# Patient Record
Sex: Female | Born: 1970 | Race: Black or African American | Hispanic: No | Marital: Married | Smoking: Never smoker
Health system: Southern US, Community
[De-identification: ages and names within clinical notes are randomized; demographics above are authoritative.]

## PROBLEM LIST (undated history)

## (undated) DIAGNOSIS — E669 Obesity, unspecified: Secondary | ICD-10-CM

## (undated) DIAGNOSIS — E119 Type 2 diabetes mellitus without complications: Secondary | ICD-10-CM

## (undated) DIAGNOSIS — I10 Essential (primary) hypertension: Secondary | ICD-10-CM

## (undated) HISTORY — PX: JOINT REPLACEMENT: SHX530

## (undated) HISTORY — PX: FEMUR FRACTURE SURGERY: SHX633

---

## 2002-01-08 ENCOUNTER — Emergency Department (HOSPITAL_COMMUNITY): Admission: EM | Admit: 2002-01-08 | Discharge: 2002-01-08 | Payer: Self-pay | Admitting: Emergency Medicine

## 2009-04-09 ENCOUNTER — Emergency Department (HOSPITAL_COMMUNITY): Admission: EM | Admit: 2009-04-09 | Discharge: 2009-04-09 | Payer: Self-pay | Admitting: Emergency Medicine

## 2010-12-08 ENCOUNTER — Emergency Department (HOSPITAL_COMMUNITY)
Admission: EM | Admit: 2010-12-08 | Discharge: 2010-12-08 | Disposition: A | Discharge status: Home / Self Care | Payer: No Typology Code available for payment source | Attending: Emergency Medicine | Admitting: Emergency Medicine

## 2010-12-08 DIAGNOSIS — T148XXA Other injury of unspecified body region, initial encounter: Secondary | ICD-10-CM | POA: Insufficient documentation

## 2010-12-08 DIAGNOSIS — I10 Essential (primary) hypertension: Secondary | ICD-10-CM | POA: Insufficient documentation

## 2010-12-08 DIAGNOSIS — E669 Obesity, unspecified: Secondary | ICD-10-CM | POA: Insufficient documentation

## 2010-12-08 DIAGNOSIS — IMO0001 Reserved for inherently not codable concepts without codable children: Secondary | ICD-10-CM | POA: Insufficient documentation

## 2012-03-17 ENCOUNTER — Emergency Department (HOSPITAL_COMMUNITY)
Admission: EM | Admit: 2012-03-17 | Discharge: 2012-03-17 | Disposition: A | Discharge status: Home / Self Care | Payer: Medicaid Other | Attending: Emergency Medicine | Admitting: Emergency Medicine

## 2012-03-17 DIAGNOSIS — Z888 Allergy status to other drugs, medicaments and biological substances status: Secondary | ICD-10-CM | POA: Insufficient documentation

## 2012-03-17 DIAGNOSIS — T7840XA Allergy, unspecified, initial encounter: Secondary | ICD-10-CM

## 2012-03-17 DIAGNOSIS — I10 Essential (primary) hypertension: Secondary | ICD-10-CM | POA: Insufficient documentation

## 2012-03-17 MED ORDER — DIPHENHYDRAMINE HCL 25 MG PO CAPS
25.0000 mg | ORAL_CAPSULE | Freq: Four times a day (QID) | ORAL | Status: DC | PRN
  Administered 2012-03-17: 25 mg via ORAL
  Filled 2012-03-17: qty 1

## 2012-03-17 MED ORDER — DEXAMETHASONE SODIUM PHOSPHATE 10 MG/ML IJ SOLN
10.0000 mg | Freq: Once | INTRAMUSCULAR | Status: AC
  Administered 2012-03-17: 10 mg via INTRAMUSCULAR
  Filled 2012-03-17: qty 1

## 2012-03-17 NOTE — ED Notes (Signed)
Patient stated that she used a hair product , started to itch took Claritin and started to have swelling around the eyes, eyes started to get itchy and runny. Patient also stated that she started feeling SOB. Respiration is even and unlabored. O2 saturation is 100 % in RA.

## 2012-03-17 NOTE — ED Provider Notes (Signed)
History    This chart was scribed for Nelia Shi, MD, MD by Smitty Pluck. The patient was seen in room TR09C and the patient's care was started at 6:24PM.   CSN: [id number]  Arrival date & time 03/17/12  1750   Chief Complaint  Patient presents with  . Allergic Reaction    (Consider location/radiation/quality/duration/timing/severity/associated sxs/prior treatment) Patient is a 41 y.o. female presenting with allergic reaction. The history is provided by the patient. No language interpreter was used.  Allergic Reaction The primary symptoms do not include shortness of breath, nausea or vomiting.  Significant symptoms also include rhinorrhea.   Brooke Hughes is a 41 y.o. female who presents to the Emergency Department complaining of allergic reaction onset 2 hours ago today. Pt reports having generalized itching all over her body, watery eyes and rhinorrhea. Pt reports symptoms started after her beautician applied new hair product during salon visit. She reports that she has taken Claritin at home without relief. Pt denies SOB, nausea and any other pain.   No past medical history on file.  No past surgical history on file.  No family history on file.  History  Substance Use Topics  . Smoking status: Not on file  . Smokeless tobacco: Not on file  . Alcohol Use: Not on file    OB History    No data available      Review of Systems  Constitutional: Negative for fever and chills.  HENT: Positive for rhinorrhea.   Eyes: Positive for itching.  Respiratory: Negative for shortness of breath.   Gastrointestinal: Negative for nausea and vomiting.  Neurological: Negative for weakness.    Allergies  Penicillins  Home Medications   Current Outpatient Rx  Name Route Sig Dispense Refill  . ADULT MULTIVITAMIN W/MINERALS CH Oral Take 1 tablet by mouth daily.      BP 160/118  Pulse 84  Temp 98.4 F (36.9 C) (Oral)  Resp 18  SpO2 98%  Physical Exam  Nursing note  and vitals reviewed. Constitutional: She is oriented to person, place, and time. She appears well-developed. No distress.  HENT:  Head: Normocephalic and atraumatic.  Eyes: Pupils are equal, round, and reactive to light.  Neck: Normal range of motion.  Cardiovascular: Normal rate and intact distal pulses.   Pulmonary/Chest: No stridor. No respiratory distress.  Abdominal: Normal appearance. She exhibits no distension. There is no tenderness. There is no rebound.  Musculoskeletal: Normal range of motion.  Neurological: She is alert and oriented to person, place, and time. No cranial nerve deficit.  Skin: Skin is warm and dry. Rash noted.  Psychiatric: She has a normal mood and affect. Her behavior is normal.    ED Course  Procedures (including critical care time)    Medications  Multiple Vitamin (MULTIVITAMIN WITH MINERALS) TABS (not administered)  diphenhydrAMINE (BENADRYL) capsule 25 mg (25 mg Oral Given 03/17/12 1855)  dexamethasone (DECADRON) injection 10 mg (10 mg Intramuscular Given 03/17/12 1855)    DIAGNOSTIC STUDIES: Oxygen Saturation is 97% on room air, normal by my interpretation.    COORDINATION OF CARE: 6:27 PM Discussed ED treatment with pt  6:56 PM Ordered:   Medications  Multiple Vitamin (MULTIVITAMIN WITH MINERALS) TABS (not administered)  diphenhydrAMINE (BENADRYL) capsule 25 mg (25 mg Oral Given 03/17/12 1855)  dexamethasone (DECADRON) injection 10 mg (10 mg Intramuscular Given 03/17/12 1855)       Labs Reviewed - No data to display No results found.   1. Allergic reaction  MDM  I personally performed the services described in this documentation, which was scribed in my presence. The recorded information has been reviewed and considered.    Nelia Shi, MD 03/17/12 2223

## 2012-03-17 NOTE — ED Notes (Addendum)
Reports allergic reaction that began after getting hair done this AM. C/o itchiness all over and stuffy nose. Bilateral breath sounds clear, airway clear. Pt took claritin at home.  Right eye redness noted. Denies nausea and SOB at this time.

## 2013-03-18 ENCOUNTER — Emergency Department (HOSPITAL_COMMUNITY)
Admission: EM | Admit: 2013-03-18 | Discharge: 2013-03-18 | Disposition: A | Discharge status: Home / Self Care | Payer: Medicaid Other | Attending: Emergency Medicine | Admitting: Emergency Medicine

## 2013-03-18 ENCOUNTER — Encounter (HOSPITAL_COMMUNITY): Payer: Self-pay | Admitting: Emergency Medicine

## 2013-03-18 DIAGNOSIS — Z880 Allergy status to penicillin: Secondary | ICD-10-CM | POA: Insufficient documentation

## 2013-03-18 DIAGNOSIS — K0889 Other specified disorders of teeth and supporting structures: Secondary | ICD-10-CM

## 2013-03-18 DIAGNOSIS — K089 Disorder of teeth and supporting structures, unspecified: Secondary | ICD-10-CM | POA: Insufficient documentation

## 2013-03-18 MED ORDER — CEPHALEXIN 500 MG PO CAPS: 500.0000 mg | ORAL_CAPSULE | Freq: Four times a day (QID) | ORAL | Status: DC

## 2013-03-18 MED ORDER — IBUPROFEN 800 MG PO TABS: 800.0000 mg | ORAL_TABLET | Freq: Three times a day (TID) | ORAL | Status: DC

## 2013-03-18 MED ORDER — HYDROCODONE-ACETAMINOPHEN 5-325 MG PO TABS: 1.0000 | ORAL_TABLET | Freq: Four times a day (QID) | ORAL | Status: DC | PRN

## 2013-03-18 MED ORDER — OXYCODONE-ACETAMINOPHEN 5-325 MG PO TABS
1.0000 | ORAL_TABLET | Freq: Once | ORAL | Status: AC
  Administered 2013-03-18: 1 via ORAL
  Filled 2013-03-18: qty 1

## 2013-03-18 MED ORDER — OXYCODONE-ACETAMINOPHEN 5-325 MG PO TABS: 1.0000 | ORAL_TABLET | Freq: Four times a day (QID) | ORAL | Status: DC | PRN

## 2013-03-18 NOTE — ED Provider Notes (Signed)
CSN: [id number]     Arrival date & time 03/18/13  1429 History  This chart was scribed for Felicie Morn, NP working with Darlys Gales, MD by Quintella Reichert, ED Scribe. This patient was seen in room TR10C/TR10C and the patient's care was started at 3:10 PM.  Chief Complaint  Patient presents with  . Dental Pain    Patient is a 42 y.o. female presenting with tooth pain. The history is provided by the patient. No language interpreter was used.  Dental Pain Toothache location: Right lower. Severity:  Severe Onset quality:  Gradual Duration: 3-4 days. Timing:  Constant Progression:  Worsening Chronicity:  New Context: dental caries   Associated symptoms: no fever     HPI Comments: Brooke Hughes is a 42 y.o. female who presents to the Emergency Department complaining of 3-4 days of constant, gradual-onset, gradually-worsening severe right lower dental pain.  Pt denies injury but states she may have a cavity in that area.  She went to her dentist's office today but it was not open.  She is allergic to penicillin.  She denies regular medication usage.   No past medical history on file.  No past surgical history on file.  No family history on file.   History  Substance Use Topics  . Smoking status: Not on file  . Smokeless tobacco: Not on file  . Alcohol Use: Not on file   OB History   No data available       Review of Systems  Constitutional: Negative for fever.  HENT: Positive for dental problem.   All other systems reviewed and are negative.     Allergies  Penicillins  Home Medications  No current outpatient prescriptions on file.  BP 155/105  Pulse 76  Temp(Src) 98.8 F (37.1 C) (Oral)  Resp 20  SpO2 98%  Physical Exam  Nursing note and vitals reviewed. Constitutional: She is oriented to person, place, and time. She appears well-developed and well-nourished. No distress.  HENT:  Head: Normocephalic and atraumatic.  Mouth/Throat:    Eyes: EOM  are normal.  Neck: Neck supple. No tracheal deviation present.  Cardiovascular: Normal rate.   Pulmonary/Chest: Effort normal. No respiratory distress.  Musculoskeletal: Normal range of motion.  Neurological: She is alert and oriented to person, place, and time.  Skin: Skin is warm and dry.  Psychiatric: She has a normal mood and affect. Her behavior is normal.    ED Course  Procedures (including critical care time)  DIAGNOSTIC STUDIES: Oxygen Saturation is 98% on room air, normal by my interpretation.    COORDINATION OF CARE: 3:09 PM-Discussed treatment plan which includes antibiotics, pain medication and dental f/u with pt at bedside and pt agreed to plan.    Labs Review Labs Reviewed - No data to display  Imaging Review No results found. Patient has a Education officer, community, but the office was closed today.  Patient will follow-up with her dentist on Monday. MDM   Dental pain.    I personally performed the services described in this documentation, which was scribed in my presence. The recorded information has been reviewed and is accurate.    Jimmye Norman, NP 03/18/13 [id number]

## 2013-03-18 NOTE — ED Notes (Signed)
C/o right lower molar pain. Onset one week ago.

## 2013-03-19 NOTE — ED Provider Notes (Signed)
Medical screening examination/treatment/procedure(s) were performed by non-physician practitioner and as supervising physician I was immediately available for consultation/collaboration.  Darlys Gales, MD 03/19/13 [phone]

## 2013-12-18 ENCOUNTER — Emergency Department (HOSPITAL_COMMUNITY)
Admission: EM | Admit: 2013-12-18 | Discharge: 2013-12-18 | Disposition: A | Discharge status: Home / Self Care | Payer: No Typology Code available for payment source | Attending: Emergency Medicine | Admitting: Emergency Medicine

## 2013-12-18 ENCOUNTER — Encounter (HOSPITAL_COMMUNITY): Payer: Self-pay | Admitting: Emergency Medicine

## 2013-12-18 DIAGNOSIS — Z791 Long term (current) use of non-steroidal anti-inflammatories (NSAID): Secondary | ICD-10-CM | POA: Insufficient documentation

## 2013-12-18 DIAGNOSIS — Y9389 Activity, other specified: Secondary | ICD-10-CM | POA: Insufficient documentation

## 2013-12-18 DIAGNOSIS — Z792 Long term (current) use of antibiotics: Secondary | ICD-10-CM | POA: Insufficient documentation

## 2013-12-18 DIAGNOSIS — Z880 Allergy status to penicillin: Secondary | ICD-10-CM | POA: Insufficient documentation

## 2013-12-18 DIAGNOSIS — T498X1A Poisoning by other topical agents, accidental (unintentional), initial encounter: Secondary | ICD-10-CM | POA: Insufficient documentation

## 2013-12-18 DIAGNOSIS — S058X9A Other injuries of unspecified eye and orbit, initial encounter: Secondary | ICD-10-CM | POA: Insufficient documentation

## 2013-12-18 DIAGNOSIS — Y9289 Other specified places as the place of occurrence of the external cause: Secondary | ICD-10-CM | POA: Insufficient documentation

## 2013-12-18 DIAGNOSIS — T2691XA Corrosion of right eye and adnexa, part unspecified, initial encounter: Secondary | ICD-10-CM

## 2013-12-18 DIAGNOSIS — S0502XA Injury of conjunctiva and corneal abrasion without foreign body, left eye, initial encounter: Secondary | ICD-10-CM

## 2013-12-18 DIAGNOSIS — T2692XA Corrosion of left eye and adnexa, part unspecified, initial encounter: Secondary | ICD-10-CM

## 2013-12-18 MED ORDER — FLUORESCEIN SODIUM 1 MG OP STRP
1.0000 | ORAL_STRIP | Freq: Once | OPHTHALMIC | Status: DC
  Filled 2013-12-18: qty 1

## 2013-12-18 MED ORDER — OXYCODONE-ACETAMINOPHEN 5-325 MG PO TABS: 1.0000 | ORAL_TABLET | ORAL | Status: DC | PRN

## 2013-12-18 MED ORDER — OXYCODONE-ACETAMINOPHEN 5-325 MG PO TABS
1.0000 | ORAL_TABLET | Freq: Once | ORAL | Status: AC
  Administered 2013-12-18: 1 via ORAL
  Filled 2013-12-18: qty 1

## 2013-12-18 MED ORDER — IBUPROFEN 800 MG PO TABS: 800.0000 mg | ORAL_TABLET | Freq: Three times a day (TID) | ORAL | Status: DC

## 2013-12-18 MED ORDER — ERYTHROMYCIN 5 MG/GM OP OINT
TOPICAL_OINTMENT | Freq: Once | OPHTHALMIC | Status: AC
  Administered 2013-12-18: 22:00:00 via OPHTHALMIC
  Filled 2013-12-18: qty 3.5

## 2013-12-18 MED ORDER — DIAZEPAM 5 MG PO TABS
5.0000 mg | ORAL_TABLET | Freq: Once | ORAL | Status: AC
  Administered 2013-12-18: 5 mg via ORAL
  Filled 2013-12-18: qty 1

## 2013-12-18 MED ORDER — TETRACAINE HCL 0.5 % OP SOLN
1.0000 [drp] | Freq: Once | OPHTHALMIC | Status: AC
  Administered 2013-12-18: 1 [drp] via OPHTHALMIC
  Filled 2013-12-18: qty 2

## 2013-12-18 MED ORDER — ONDANSETRON 8 MG PO TBDP
8.0000 mg | ORAL_TABLET | Freq: Once | ORAL | Status: AC
  Administered 2013-12-18: 8 mg via ORAL
  Filled 2013-12-18: qty 1

## 2013-12-18 NOTE — ED Notes (Signed)
Patient unable to keep eyes open to complete visual acuity.

## 2013-12-18 NOTE — ED Provider Notes (Signed)
Patient signed out to me at shift change by PA Piepenbrink. Patient came to emergency department complaining of burning to bilateral eyes after using a hair gel and getting in and her eyes. Patient states she did rub her eyes after her pretty vigorously. Patient with severe pain, difficulty opening her eyes, patient is receiving Morgan lens washout with normal saline.  9:29 PM Patient finished 1 L Morgan lens pulses in both eyes, right eye feeling better, left eye continues to have severe pain. Upon evaluation and noticed possible cornea defect, I repeated fluorescein stain which showed very large left eye corneal abrasion that is over entire pupil and iris. Will consult ophthalmology. Pain medication ordered, Percocet and Zofran.  9:38 PM Spoke with Dr. Nehemiah Massed, advised erythromycin ointment topically, patching the eye, follow up with him in the office.   Filed Vitals:   12/18/13 1832 12/18/13 1934 12/18/13 2137  BP: 140/101 142/89 152/79  Pulse: 72  75  Temp: 97.8 F (36.6 C)    TempSrc: Oral    Resp: 20  16  SpO2: 99%  100%     Florene Route Mirielle Byrum, PA-C 12/19/13 0036

## 2013-12-18 NOTE — ED Notes (Signed)
Patient states her eye feels the same as when she came in today.

## 2013-12-18 NOTE — ED Provider Notes (Signed)
CSN: [id number]     Arrival date & time 12/18/13  2706 History  This chart was scribed for non-physician practitioner, Baron Sane, PA-C working with Blanchard Kelch, MD by Frederich Balding, ED scribe. This patient was seen in room WTR7/WTR7 and the patient's care was started at 7:04 PM.   Chief Complaint  Patient presents with  . Burning Eyes   The history is provided by the patient. No language interpreter was used.   HPI Comments: Brooke Hughes is a 43 y.o. female who presents to the Emergency Department complaining of bilateral burning eye pain, left worse than right, redness and itching that started last night. Reports watery and yellow drainage from her eyes. Pt thinks she might have gotten hair gel in her eyes. States she has had styes in her eyes in the past but does not think this is the same. Pt does not wear contacts or glasses.    History reviewed. No pertinent past medical history. Past Surgical History  Procedure Laterality Date  . Joint replacement     No family history on file. History  Substance Use Topics  . Smoking status: Never Smoker   . Smokeless tobacco: Not on file  . Alcohol Use: No   OB History   Grav Para Term Preterm Abortions TAB SAB Ect Mult Living                 Review of Systems  Eyes: Positive for pain, discharge, redness and itching.  All other systems reviewed and are negative.  Allergies  Penicillins  Home Medications   Prior to Admission medications   Medication Sig Start Date End Date Taking? Authorizing Provider  cephALEXin (KEFLEX) 500 MG capsule Take 1 capsule (500 mg total) by mouth 4 (four) times daily. 03/18/13   Norman Herrlich, NP  ibuprofen (ADVIL,MOTRIN) 800 MG tablet Take 1 tablet (800 mg total) by mouth 3 (three) times daily. 03/18/13   Norman Herrlich, NP  oxyCODONE-acetaminophen (PERCOCET/ROXICET) 5-325 MG per tablet Take 1 tablet by mouth every 6 (six) hours as needed for pain (use for pain not controlled with  ibuprofen). 03/18/13   Norman Herrlich, NP   BP 140/101  Pulse 72  Temp(Src) 97.8 F (36.6 C) (Oral)  Resp 20  SpO2 99%  LMP 12/11/2013  Physical Exam  Nursing note and vitals reviewed. Constitutional: She is oriented to person, place, and time. She appears well-developed and well-nourished. No distress.  HENT:  Head: Normocephalic and atraumatic.  Right Ear: External ear normal.  Left Ear: External ear normal.  Nose: Nose normal.  Mouth/Throat: Oropharynx is clear and moist.  Eyes: EOM are normal. Pupils are equal, round, and reactive to light. Right eye exhibits discharge (watery). Right eye exhibits no chemosis, no exudate and no hordeolum. No foreign body present in the right eye. Left eye exhibits discharge (watery). Left eye exhibits no chemosis, no exudate and no hordeolum. No foreign body present in the left eye. Right conjunctiva is injected. Right conjunctiva has no hemorrhage. Left conjunctiva is injected. Left conjunctiva has no hemorrhage. No scleral icterus.  Fundoscopic exam:      The right eye shows no hemorrhage and no papilledema.       The left eye shows no hemorrhage and no papilledema.  Slit lamp exam:      The right eye shows no corneal abrasion, no corneal flare, no corneal ulcer and no fluorescein uptake.       The left eye shows no  corneal abrasion, no corneal flare, no corneal ulcer and no fluorescein uptake.  pH of left eye: 7.5 pH of right eye: 8  Neck: Normal range of motion. Neck supple.  Cardiovascular: Normal rate.   Pulmonary/Chest: Effort normal.  Abdominal: Soft.  Musculoskeletal: Normal range of motion.  Neurological: She is alert and oriented to person, place, and time.  Skin: Skin is warm and dry. She is not diaphoretic.  Psychiatric: She has a normal mood and affect.    ED Course  Procedures (including critical care time) Medications  tetracaine (PONTOCAINE) 0.5 % ophthalmic solution 1 drop (not administered)  fluorescein ophthalmic  strip 1 strip (not administered)  diazepam (VALIUM) tablet 5 mg (5 mg Oral Given 12/18/13 1933)    DIAGNOSTIC STUDIES: Oxygen Saturation is 99% on RA, normal by my interpretation.    COORDINATION OF CARE: 7:08 PM-Discussed treatment plan which includes checking pH of eyes and for corneal abrasions with pt at bedside and pt agreed to plan.   7:15 PM-Will give pt valium and flush eyes out.  Labs Review Labs Reviewed - No data to display  Imaging Review No results found.   EKG Interpretation None      MDM   Final diagnoses:  None    Filed Vitals:   12/18/13 1934  BP: 142/89  Pulse:   Temp:   Resp:    Afebrile, NAD, non-toxic appearing, AAOx4.  Patient presenting to emergency department for bilateral injected, turning eye pain with watery discharge after getting hair gel in her eyes last evening. Unable to obtain visual acuity testing as patient is unable to keep her eyes open. There is no orbital or periorbital swelling, areas are nontender to palpation. There is no intraocular abnormality noted. Mildly elevated pH, this testing. No fluorescein uptake in either eye. Patient declined Valium. Pain likely secondarily to hair gel. Will place Abram lens and re-evaluate. Will sign patient out to [employer], PA-C.  I personally performed the services described in this documentation, which was scribed in my presence. The recorded information has been reviewed and is accurate.  Harlow Mares, PA-C 12/18/13 2007

## 2013-12-18 NOTE — ED Notes (Addendum)
On assessment of left eye patient cornea green from fluorescein solution. Patient states eye stills feel same as when she first came in.  Morgan lens with 2 L fluid applied at this time. Patient tolerating well.

## 2013-12-18 NOTE — Discharge Instructions (Signed)
Erythromycin ointment every 2 hrs to the left eye. Take pain medications as prescribed. Follow up with Dr. Nehemiah Massed in the office tomorrow  Corneal Abrasion The cornea is the clear covering at the front and center of the eye. When looking at the colored portion of the eye (iris), you are looking through the cornea. This very thin tissue is made up of many layers. The surface layer is a single layer of cells (corneal epithelium) and is one of the most sensitive tissues in the body. If a scratch or injury causes the corneal epithelium to come off, it is called a corneal abrasion. If the injury extends to the tissues below the epithelium, the condition is called a corneal ulcer. CAUSES   Scratches.  Trauma.  Foreign body in the eye. Some people have recurrences of abrasions in the area of the original injury even after it has healed (recurrent erosion syndrome). Recurrent erosion syndrome generally improves and goes away with time. SYMPTOMS   Eye pain.  Difficulty or inability to keep the injured eye open.  The eye becomes very sensitive to light.  Recurrent erosions tend to happen suddenly, first thing in the morning, usually after waking up and opening the eye. DIAGNOSIS  Your health care provider can diagnose a corneal abrasion during an eye exam. Dye is usually placed in the eye using a drop or a small paper strip moistened by your tears. When the eye is examined with a special light, the abrasion shows up clearly because of the dye. TREATMENT   Small abrasions may be treated with antibiotic drops or ointment alone.  A pressure patch may be put over the eye. If this is done, follow your doctor's instructions for when to remove the patch. Do not drive or use machines while the eye patch is on. Judging distances is hard to do with a patch on. If the abrasion becomes infected and spreads to the deeper tissues of the cornea, a corneal ulcer can result. This is serious because it can cause  corneal scarring. Corneal scars interfere with light passing through the cornea and cause a loss of vision in the involved eye. HOME CARE INSTRUCTIONS  Use medicine or ointment as directed. Only take over-the-counter or prescription medicines for pain, discomfort, or fever as directed by your health care provider.  Do not drive or operate machinery if your eye is patched. Your ability to judge distances is impaired.  If your health care provider has given you a follow-up appointment, it is very important to keep that appointment. Not keeping the appointment could result in a severe eye infection or permanent loss of vision. If there is any problem keeping the appointment, let your health care provider know. SEEK MEDICAL CARE IF:   You have pain, light sensitivity, and a scratchy feeling in one eye or both eyes.  Your pressure patch keeps loosening up, and you can blink your eye under the patch after treatment.  Any kind of discharge develops from the eye after treatment or if the lids stick together in the morning.  You have the same symptoms in the morning as you did with the original abrasion days, weeks, or months after the abrasion healed. MAKE SURE YOU:   Understand these instructions.  Will watch your condition.  Will get help right away if you are not doing well or get worse. Document Released: 05/30/2000 Document Revised: 06/07/2013 Document Reviewed: 02/07/2013 [facility] Patient Information 2015 [place], [place]. This information is not intended to replace advice  given to you by your health care provider. Make sure you discuss any questions you have with your health care provider.

## 2013-12-18 NOTE — ED Notes (Signed)
Pt c/o bilateral eye burning, itching, and pain. States she may have gotten hair gel in her eyes last night

## 2013-12-19 NOTE — ED Provider Notes (Signed)
Medical screening examination/treatment/procedure(s) were conducted as a shared visit with non-physician practitioner(s) and myself.  I personally evaluated the patient during the encounter.   EKG Interpretation None      I interviewed and examined the patient. Lungs are CTAB. Cardiac exam wnl. Abdomen soft. Pt appears to have large corneal abrasion to left eye. Tatyana discussed case w/ optho who will f/u the pt tomorrow.   Blanchard Kelch, MD 12/19/13 2007

## 2013-12-19 NOTE — ED Provider Notes (Signed)
Medical screening examination/treatment/procedure(s) were conducted as a shared visit with non-physician practitioner(s) and myself.  I personally evaluated the patient during the encounter.   EKG Interpretation None      See other note.   Blanchard Kelch, MD 12/19/13 2006

## 2014-04-27 ENCOUNTER — Encounter (HOSPITAL_COMMUNITY): Payer: Self-pay | Admitting: Emergency Medicine

## 2014-04-27 ENCOUNTER — Emergency Department (HOSPITAL_COMMUNITY)
Admission: EM | Admit: 2014-04-27 | Discharge: 2014-04-27 | Disposition: A | Discharge status: Home / Self Care | Payer: No Typology Code available for payment source | Attending: Emergency Medicine | Admitting: Emergency Medicine

## 2014-04-27 DIAGNOSIS — Z880 Allergy status to penicillin: Secondary | ICD-10-CM | POA: Insufficient documentation

## 2014-04-27 DIAGNOSIS — Y9289 Other specified places as the place of occurrence of the external cause: Secondary | ICD-10-CM | POA: Insufficient documentation

## 2014-04-27 DIAGNOSIS — S0501XA Injury of conjunctiva and corneal abrasion without foreign body, right eye, initial encounter: Secondary | ICD-10-CM | POA: Insufficient documentation

## 2014-04-27 DIAGNOSIS — I10 Essential (primary) hypertension: Secondary | ICD-10-CM | POA: Insufficient documentation

## 2014-04-27 DIAGNOSIS — Z792 Long term (current) use of antibiotics: Secondary | ICD-10-CM | POA: Insufficient documentation

## 2014-04-27 DIAGNOSIS — Z791 Long term (current) use of non-steroidal anti-inflammatories (NSAID): Secondary | ICD-10-CM | POA: Insufficient documentation

## 2014-04-27 DIAGNOSIS — H1031 Unspecified acute conjunctivitis, right eye: Secondary | ICD-10-CM | POA: Insufficient documentation

## 2014-04-27 DIAGNOSIS — Y998 Other external cause status: Secondary | ICD-10-CM | POA: Insufficient documentation

## 2014-04-27 DIAGNOSIS — E669 Obesity, unspecified: Secondary | ICD-10-CM | POA: Insufficient documentation

## 2014-04-27 DIAGNOSIS — Y9389 Activity, other specified: Secondary | ICD-10-CM | POA: Insufficient documentation

## 2014-04-27 DIAGNOSIS — X58XXXA Exposure to other specified factors, initial encounter: Secondary | ICD-10-CM | POA: Insufficient documentation

## 2014-04-27 HISTORY — DX: Essential (primary) hypertension: I10

## 2014-04-27 HISTORY — DX: Obesity, unspecified: E66.9

## 2014-04-27 MED ORDER — FLUORESCEIN SODIUM 1 MG OP STRP
1.0000 | ORAL_STRIP | Freq: Once | OPHTHALMIC | Status: DC
  Filled 2014-04-27: qty 1

## 2014-04-27 MED ORDER — ERYTHROMYCIN 5 MG/GM OP OINT
1.0000 "application " | TOPICAL_OINTMENT | Freq: Once | OPHTHALMIC | Status: AC
  Administered 2014-04-27: 1 via OPHTHALMIC
  Filled 2014-04-27: qty 3.5

## 2014-04-27 MED ORDER — TETRACAINE HCL 0.5 % OP SOLN
2.0000 [drp] | Freq: Once | OPHTHALMIC | Status: DC
  Filled 2014-04-27: qty 2

## 2014-04-27 MED ORDER — ATROPINE SULFATE 1 % OP SOLN
1.0000 [drp] | Freq: Four times a day (QID) | OPHTHALMIC | Status: DC
  Administered 2014-04-27: 1 [drp] via OPHTHALMIC
  Filled 2014-04-27: qty 2

## 2014-04-27 NOTE — ED Notes (Signed)
Discharge instructions and meds given.  Instructions given and patient voiced understanding.

## 2014-04-27 NOTE — ED Notes (Signed)
Attempted to do a visual acuity on pt pt states she could not see out of neither of her eyes also states that her R eye is blurry no other complaints noted at this time

## 2014-04-27 NOTE — Discharge Instructions (Signed)
Corneal Abrasion The cornea is the clear covering at the front and center of the eye. When looking at the colored portion of the eye (iris), you are looking through the cornea. This very thin tissue is made up of many layers. The surface layer is a single layer of cells (corneal epithelium) and is one of the most sensitive tissues in the body. If a scratch or injury causes the corneal epithelium to come off, it is called a corneal abrasion. If the injury extends to the tissues below the epithelium, the condition is called a corneal ulcer. CAUSES   Scratches.  Trauma.  Foreign body in the eye. Some people have recurrences of abrasions in the area of the original injury even after it has healed (recurrent erosion syndrome). Recurrent erosion syndrome generally improves and goes away with time. SYMPTOMS   Eye pain.  Difficulty or inability to keep the injured eye open.  The eye becomes very sensitive to light.  Recurrent erosions tend to happen suddenly, first thing in the morning, usually after waking up and opening the eye. DIAGNOSIS  Your health care provider can diagnose a corneal abrasion during an eye exam. Dye is usually placed in the eye using a drop or a small paper strip moistened by your tears. When the eye is examined with a special light, the abrasion shows up clearly because of the dye. TREATMENT   Small abrasions may be treated with antibiotic drops or ointment alone.  A pressure patch may be put over the eye. If this is done, follow your doctor's instructions for when to remove the patch. Do not drive or use machines while the eye patch is on. Judging distances is hard to do with a patch on. If the abrasion becomes infected and spreads to the deeper tissues of the cornea, a corneal ulcer can result. This is serious because it can cause corneal scarring. Corneal scars interfere with light passing through the cornea and cause a loss of vision in the involved eye. HOME CARE  INSTRUCTIONS  Use medicine or ointment as directed. Only take over-the-counter or prescription medicines for pain, discomfort, or fever as directed by your health care provider.  Do not drive or operate machinery if your eye is patched. Your ability to judge distances is impaired.  If your health care provider has given you a follow-up appointment, it is very important to keep that appointment. Not keeping the appointment could result in a severe eye infection or permanent loss of vision. If there is any problem keeping the appointment, let your health care provider know. SEEK MEDICAL CARE IF:   You have pain, light sensitivity, and a scratchy feeling in one eye or both eyes.  Your pressure patch keeps loosening up, and you can blink your eye under the patch after treatment.  Any kind of discharge develops from the eye after treatment or if the lids stick together in the morning.  You have the same symptoms in the morning as you did with the original abrasion days, weeks, or months after the abrasion healed. MAKE SURE YOU:   Understand these instructions.  Will watch your condition.  Will get help right away if you are not doing well or get worse. Document Released: 05/30/2000 Document Revised: 06/07/2013 Document Reviewed: 02/07/2013 [facility] Patient Information 2015 [place], [place]. This information is not intended to replace advice given to you by your health care provider. Make sure you discuss any questions you have with your health care provider. Please use the  supplied eye drops for the next 2-3 days 1 drop to your R eye 4 times daily Use the supplied ointment 1/4 inch strip inside the lower lid 4 times daily for the next 5 days   IT IS IMPORTANT THAT YOU USE THE EYE DROP FIRST wait 1-2 minutes than use the ointment

## 2014-04-27 NOTE — ED Provider Notes (Signed)
CSN: [id number]     Arrival date & time 04/27/14  1925 History   First MD Initiated Contact with Patient 04/27/14 2214     Chief Complaint  Patient presents with  . Conjunctivitis     (Consider location/radiation/quality/duration/timing/severity/associated sxs/prior Treatment) HPI Comments: Patient states she slept with Hair gel that was supposed to be washed out prior to bed that noted in the morning she had irritation to R eye that has progressively gotten worse over the day  Now with blurry vision, photophobia, and tearing   Patient is a 43 y.o. female presenting with conjunctivitis. The history is provided by the patient.  Conjunctivitis This is a new problem. The current episode started yesterday. The problem occurs constantly. The problem has been gradually worsening. Pertinent negatives include no chest pain, congestion, fever, rash or weakness. Exacerbated by: blinking. She has tried nothing for the symptoms. The treatment provided no relief.    Past Medical History  Diagnosis Date  . Hypertension   . Obesity    Past Surgical History  Procedure Laterality Date  . Joint replacement     No family history on file. History  Substance Use Topics  . Smoking status: Never Smoker   . Smokeless tobacco: Not on file  . Alcohol Use: No   OB History    No data available     Review of Systems  Constitutional: Negative for fever.  HENT: Negative for congestion and facial swelling.   Eyes: Positive for photophobia, pain, discharge, redness and visual disturbance.  Respiratory: Negative for shortness of breath.   Cardiovascular: Negative for chest pain.  Musculoskeletal: Negative for back pain.  Skin: Negative for rash and wound.  Neurological: Negative for dizziness and weakness.  All other systems reviewed and are negative.     Allergies  Penicillins  Home Medications   Prior to Admission medications   Medication Sig Start Date End Date Taking? Authorizing Provider   cephALEXin (KEFLEX) 500 MG capsule Take 1 capsule (500 mg total) by mouth 4 (four) times daily. 03/18/13   Norman Herrlich, NP  ibuprofen (ADVIL,MOTRIN) 800 MG tablet Take 1 tablet (800 mg total) by mouth 3 (three) times daily. 03/18/13   Norman Herrlich, NP  ibuprofen (ADVIL,MOTRIN) 800 MG tablet Take 1 tablet (800 mg total) by mouth 3 (three) times daily. 12/18/13   Tatyana A Kirichenko, PA-C  oxyCODONE-acetaminophen (PERCOCET) 5-325 MG per tablet Take 1 tablet by mouth every 4 (four) hours as needed for severe pain. 12/18/13   Tatyana A Kirichenko, PA-C  oxyCODONE-acetaminophen (PERCOCET/ROXICET) 5-325 MG per tablet Take 1 tablet by mouth every 6 (six) hours as needed for pain (use for pain not controlled with ibuprofen). 03/18/13   Norman Herrlich, NP   BP 156/101 mmHg  Pulse 67  Temp(Src) 98.7 F (37.1 C)  Resp 16  SpO2 100%  LMP 04/19/2014 Physical Exam  Constitutional: She appears well-developed and well-nourished.  HENT:  Head: Normocephalic.  Right Ear: External ear normal.  Left Ear: External ear normal.  Eyes: EOM are normal. Right eye exhibits discharge. Right conjunctiva is injected.  Slit lamp exam:      The right eye shows corneal abrasion.    Neck: Normal range of motion.  Cardiovascular: Normal rate.   Pulmonary/Chest: Effort normal.  Musculoskeletal: Normal range of motion.  Neurological: She is alert.  Skin: Skin is warm. No rash noted. No pallor.  Nursing note and vitals reviewed.   ED Course  Procedures (including critical care time)  Labs Review Labs Reviewed - No data to display  Imaging Review No results found.   EKG Interpretation None     patient with corneal abrasion over pupil with treat with Atropine opthalmic drop and erythromycin ointment and opth FU  MDM   Final diagnoses:  Corneal abrasion, right, initial encounter         Garald Balding, NP 04/27/14 [place], MD 05/04/14 1013

## 2014-04-27 NOTE — ED Notes (Signed)
Pt. Reports right eye redness , itching / " burning " , with drainage onset last night , denies injury , no vision loss or blurred vision . Denies fever or chills.

## 2015-01-09 ENCOUNTER — Emergency Department (HOSPITAL_COMMUNITY)
Admission: EM | Admit: 2015-01-09 | Discharge: 2015-01-10 | Disposition: A | Discharge status: Home / Self Care | Payer: Self-pay | Attending: Emergency Medicine | Admitting: Emergency Medicine

## 2015-01-09 ENCOUNTER — Encounter (HOSPITAL_COMMUNITY): Payer: Self-pay

## 2015-01-09 DIAGNOSIS — I10 Essential (primary) hypertension: Secondary | ICD-10-CM | POA: Insufficient documentation

## 2015-01-09 DIAGNOSIS — E669 Obesity, unspecified: Secondary | ICD-10-CM | POA: Insufficient documentation

## 2015-01-09 DIAGNOSIS — Z880 Allergy status to penicillin: Secondary | ICD-10-CM | POA: Insufficient documentation

## 2015-01-09 DIAGNOSIS — R1031 Right lower quadrant pain: Secondary | ICD-10-CM | POA: Insufficient documentation

## 2015-01-09 DIAGNOSIS — Z3202 Encounter for pregnancy test, result negative: Secondary | ICD-10-CM | POA: Insufficient documentation

## 2015-01-09 DIAGNOSIS — R1011 Right upper quadrant pain: Secondary | ICD-10-CM

## 2015-01-09 DIAGNOSIS — Z79899 Other long term (current) drug therapy: Secondary | ICD-10-CM | POA: Insufficient documentation

## 2015-01-09 DIAGNOSIS — R109 Unspecified abdominal pain: Secondary | ICD-10-CM

## 2015-01-09 LAB — COMPREHENSIVE METABOLIC PANEL
ALT: 12 U/L — ABNORMAL LOW (ref 14–54)
AST: 19 U/L (ref 15–41)
Albumin: 3.6 g/dL (ref 3.5–5.0)
Alkaline Phosphatase: 89 U/L (ref 38–126)
Anion gap: 6 (ref 5–15)
BUN: 11 mg/dL (ref 6–20)
CO2: 27 mmol/L (ref 22–32)
Calcium: 9.2 mg/dL (ref 8.9–10.3)
Chloride: 105 mmol/L (ref 101–111)
Creatinine, Ser: 0.87 mg/dL (ref 0.44–1.00)
GFR calc Af Amer: >60 mL/min (ref >60)
GFR calc non Af Amer: >60 mL/min (ref >60)
Glucose, Bld: 152 mg/dL — ABNORMAL HIGH (ref 65–99)
Potassium: 3.6 mmol/L (ref 3.5–5.1)
Sodium: 138 mmol/L (ref 135–145)
Total Bilirubin: 0.4 mg/dL (ref 0.3–1.2)
Total Protein: 7.9 g/dL (ref 6.5–8.1)

## 2015-01-09 LAB — LIPASE, BLOOD: Lipase: 14 U/L — ABNORMAL LOW (ref 22–51)

## 2015-01-09 LAB — CBC
HCT: 32 % — ABNORMAL LOW (ref 36.0–46.0)
Hemoglobin: 9.8 g/dL — ABNORMAL LOW (ref 12.0–15.0)
MCH: 22.4 pg — ABNORMAL LOW (ref 26.0–34.0)
MCHC: 30.6 g/dL (ref 30.0–36.0)
MCV: 73.1 fL — ABNORMAL LOW (ref 78.0–100.0)
Platelets: 383 10*3/uL (ref 150–400)
RBC: 4.38 MIL/uL (ref 3.87–5.11)
RDW: 17.7 % — ABNORMAL HIGH (ref 11.5–15.5)
WBC: 6.2 10*3/uL (ref 4.0–10.5)

## 2015-01-09 LAB — I-STAT BETA HCG BLOOD, ED (MC, WL, AP ONLY): I-stat hCG, quantitative: <5 m[IU]/mL (ref <5)

## 2015-01-09 NOTE — ED Notes (Signed)
Pt presents with c/o abdominal pain on the lower right side for approx 2 days. Pt denies any N/V/D.

## 2015-01-10 ENCOUNTER — Emergency Department (HOSPITAL_COMMUNITY): Payer: Self-pay

## 2015-01-10 ENCOUNTER — Encounter (HOSPITAL_COMMUNITY): Payer: Self-pay

## 2015-01-10 ENCOUNTER — Emergency Department (HOSPITAL_COMMUNITY): Payer: Medicaid Other

## 2015-01-10 LAB — URINALYSIS, ROUTINE W REFLEX MICROSCOPIC
Bilirubin Urine: NEGATIVE
Glucose, UA: NEGATIVE mg/dL
Hgb urine dipstick: NEGATIVE
Ketones, ur: NEGATIVE mg/dL
Leukocytes, UA: NEGATIVE
Nitrite: NEGATIVE
Protein, ur: NEGATIVE mg/dL
Specific Gravity, Urine: 1.031 — ABNORMAL HIGH (ref 1.005–1.030)
Urobilinogen, UA: 0.2 mg/dL (ref 0.0–1.0)
pH: 5.5 (ref 5.0–8.0)

## 2015-01-10 MED ORDER — IOHEXOL 300 MG/ML  SOLN
100.0000 mL | Freq: Once | INTRAMUSCULAR | Status: AC | PRN
  Administered 2015-01-10: 100 mL via INTRAVENOUS

## 2015-01-10 MED ORDER — HYDROCODONE-ACETAMINOPHEN 5-325 MG PO TABS: 1.0000 | ORAL_TABLET | Freq: Four times a day (QID) | ORAL | Status: DC | PRN

## 2015-01-10 MED ORDER — ONDANSETRON 4 MG PO TBDP: 4.0000 mg | ORAL_TABLET | Freq: Three times a day (TID) | ORAL | Status: DC | PRN

## 2015-01-10 MED ORDER — SODIUM CHLORIDE 0.9 % IV BOLUS (SEPSIS)
1000.0000 mL | Freq: Once | INTRAVENOUS | Status: AC
Start: 2015-01-10 — End: 2015-01-10
  Administered 2015-01-10: 1000 mL via INTRAVENOUS

## 2015-01-10 MED ORDER — ONDANSETRON HCL 4 MG/2ML IJ SOLN
4.0000 mg | Freq: Once | INTRAMUSCULAR | Status: DC
  Filled 2015-01-10: qty 2

## 2015-01-10 MED ORDER — IOHEXOL 300 MG/ML  SOLN
25.0000 mL | Freq: Once | INTRAMUSCULAR | Status: AC | PRN
  Administered 2015-01-10: 25 mL via ORAL

## 2015-01-10 MED ORDER — MORPHINE SULFATE 4 MG/ML IJ SOLN
4.0000 mg | Freq: Once | INTRAMUSCULAR | Status: DC
  Filled 2015-01-10: qty 1

## 2015-01-10 NOTE — Discharge Instructions (Signed)
Please follow up with your primary care physician in 1-2 days. If you do not have one please call the [facility] number listed above. Please take pain medication and/or muscle relaxants as prescribed and as needed for pain. Please do not drive on narcotic pain medication or on muscle relaxants. Please read all discharge instructions and return precautions.    Abdominal Pain, Women Abdominal (stomach, pelvic, or belly) pain can be caused by many things. It is important to tell your doctor:  The location of the pain.  Does it come and go or is it present all the time?  Are there things that start the pain (eating certain foods, exercise)?  Are there other symptoms associated with the pain (fever, nausea, vomiting, diarrhea)? All of this is helpful to know when trying to find the cause of the pain. CAUSES   Stomach: virus or bacteria infection, or ulcer.  Intestine: appendicitis (inflamed appendix), regional ileitis (Crohn's disease), ulcerative colitis (inflamed colon), irritable bowel syndrome, diverticulitis (inflamed diverticulum of the colon), or cancer of the stomach or intestine.  Gallbladder disease or stones in the gallbladder.  Kidney disease, kidney stones, or infection.  Pancreas infection or cancer.  Fibromyalgia (pain disorder).  Diseases of the female organs:  Uterus: fibroid (non-cancerous) tumors or infection.  Fallopian tubes: infection or tubal pregnancy.  Ovary: cysts or tumors.  Pelvic adhesions (scar tissue).  Endometriosis (uterus lining tissue growing in the pelvis and on the pelvic organs).  Pelvic congestion syndrome (female organs filling up with blood just before the menstrual period).  Pain with the menstrual period.  Pain with ovulation (producing an egg).  Pain with an IUD (intrauterine device, birth control) in the uterus.  Cancer of the female organs.  Functional pain (pain not caused by a disease, may improve  without treatment).  Psychological pain.  Depression. DIAGNOSIS  Your doctor will decide the seriousness of your pain by doing an examination.  Blood tests.  X-rays.  Ultrasound.  CT scan (computed tomography, special type of X-ray).  MRI (magnetic resonance imaging).  Cultures, for infection.  Barium enema (dye inserted in the large intestine, to better view it with X-rays).  Colonoscopy (looking in intestine with a lighted tube).  Laparoscopy (minor surgery, looking in abdomen with a lighted tube).  Major abdominal exploratory surgery (looking in abdomen with a large incision). TREATMENT  The treatment will depend on the cause of the pain.   Many cases can be observed and treated at home.  Over-the-counter medicines recommended by your caregiver.  Prescription medicine.  Antibiotics, for infection.  Birth control pills, for painful periods or for ovulation pain.  Hormone treatment, for endometriosis.  Nerve blocking injections.  Physical therapy.  Antidepressants.  Counseling with a psychologist or psychiatrist.  Minor or major surgery. HOME CARE INSTRUCTIONS   Do not take laxatives, unless directed by your caregiver.  Take over-the-counter pain medicine only if ordered by your caregiver. Do not take aspirin because it can cause an upset stomach or bleeding.  Try a clear liquid diet (broth or water) as ordered by your caregiver. Slowly move to a bland diet, as tolerated, if the pain is related to the stomach or intestine.  Have a thermometer and take your temperature several times a day, and record it.  Bed rest and sleep, if it helps the pain.  Avoid sexual intercourse, if it causes pain.  Avoid stressful situations.  Keep your follow-up appointments and tests, as your caregiver orders.  If  the pain does not go away with medicine or surgery, you may try:  Acupuncture.  Relaxation exercises (yoga, meditation).  Group  therapy.  Counseling. SEEK MEDICAL CARE IF:   You notice certain foods cause stomach pain.  Your home care treatment is not helping your pain.  You need stronger pain medicine.  You want your IUD removed.  You feel faint or lightheaded.  You develop nausea and vomiting.  You develop a rash.  You are having side effects or an allergy to your medicine. SEEK IMMEDIATE MEDICAL CARE IF:   Your pain does not go away or gets worse.  You have a fever.  Your pain is felt only in portions of the abdomen. The right side could possibly be appendicitis. The left lower portion of the abdomen could be colitis or diverticulitis.  You are passing blood in your stools (bright red or black tarry stools, with or without vomiting).  You have blood in your urine.  You develop chills, with or without a fever.  You pass out. MAKE SURE YOU:   Understand these instructions.  Will watch your condition.  Will get help right away if you are not doing well or get worse. Document Released: 03/30/2007 Document Revised: 10/17/2013 Document Reviewed: 04/19/2009 [facility] Patient Information 2015 [place], [place]. This information is not intended to replace advice given to you by your health care provider. Make sure you discuss any questions you have with your health care provider.

## 2015-01-10 NOTE — ED Notes (Signed)
Patient request to wait for medication. Patient agrees to NS Bolus Fluid.

## 2015-01-10 NOTE — ED Provider Notes (Signed)
CSN: [id number]     Arrival date & time 01/09/15  2129 History   First MD Initiated Contact with Patient 01/10/15 0035     Chief Complaint  Patient presents with  . Abdominal Pain     (Consider location/radiation/quality/duration/timing/severity/associated sxs/prior Treatment) HPI Comments: Patient is a 44 yo M PMHx significant for HTN presenting to the ED for evaluation of acute onset RLQ abdominal pain that began two days ago that has been gradually worsening in severity. Patient states she has felt fatigued, but denies any nausea, vomiting, diarrhea, or fevers. She states she finished her menstrual cycle last week, no other GU complaints. No urinary symptoms. No medications tried PTA. Abdominal surgical history includes three c-sections.    Past Medical History  Diagnosis Date  . Hypertension   . Obesity    Past Surgical History  Procedure Laterality Date  . Joint replacement     No family history on file. History  Substance Use Topics  . Smoking status: Never Smoker   . Smokeless tobacco: Not on file  . Alcohol Use: No   OB History    No data available     Review of Systems  Gastrointestinal: Positive for abdominal pain.  All other systems reviewed and are negative.     Allergies  Penicillins  Home Medications   Prior to Admission medications   Medication Sig Start Date End Date Taking? Authorizing Provider  Multiple Vitamin (MULTIVITAMIN WITH MINERALS) TABS tablet Take 1 tablet by mouth daily.   Yes Historical Provider, MD  cephALEXin (KEFLEX) 500 MG capsule Take 1 capsule (500 mg total) by mouth 4 (four) times daily. Patient not taking: Reported on 01/10/2015 03/18/13   Etta Quill, NP  HYDROcodone-acetaminophen (NORCO/VICODIN) 5-325 MG per tablet Take 1-2 tablets by mouth every 6 (six) hours as needed for severe pain. 01/10/15   Pearline Yerby, PA-C  ibuprofen (ADVIL,MOTRIN) 800 MG tablet Take 1 tablet (800 mg total) by mouth 3 (three) times  daily. Patient not taking: Reported on 01/10/2015 03/18/13   Etta Quill, NP  ibuprofen (ADVIL,MOTRIN) 800 MG tablet Take 1 tablet (800 mg total) by mouth 3 (three) times daily. Patient not taking: Reported on 01/10/2015 12/18/13   Tatyana Kirichenko, PA-C  ondansetron (ZOFRAN ODT) 4 MG disintegrating tablet Take 1 tablet (4 mg total) by mouth every 8 (eight) hours as needed for nausea. 01/10/15   Riggin Cuttino, PA-C  oxyCODONE-acetaminophen (PERCOCET) 5-325 MG per tablet Take 1 tablet by mouth every 4 (four) hours as needed for severe pain. Patient not taking: Reported on 01/10/2015 12/18/13   Jeannett Senior, PA-C  oxyCODONE-acetaminophen (PERCOCET/ROXICET) 5-325 MG per tablet Take 1 tablet by mouth every 6 (six) hours as needed for pain (use for pain not controlled with ibuprofen). Patient not taking: Reported on 01/10/2015 03/18/13   Etta Quill, NP   BP 145/74 mmHg  Pulse 63  Temp(Src) 98.5 F (36.9 C) (Oral)  Resp 18  SpO2 100%  LMP 01/02/2015 (Approximate) Physical Exam  Constitutional: She is oriented to person, place, and time. She appears well-developed and well-nourished. No distress.  HENT:  Head: Normocephalic and atraumatic.  Right Ear: External ear normal.  Left Ear: External ear normal.  Nose: Nose normal.  Eyes: Conjunctivae are normal.  Neck: Neck supple.  Cardiovascular: Normal rate, regular rhythm and normal heart sounds.   Pulmonary/Chest: Effort normal and breath sounds normal.  Abdominal: Soft. Bowel sounds are normal. There is tenderness in the right lower quadrant. There is tenderness at McBurney's point. There  is no rigidity, no rebound and no guarding.  Neurological: She is alert and oriented to person, place, and time.  Skin: Skin is warm and dry. She is not diaphoretic.  Nursing note and vitals reviewed.   ED Course  Procedures (including critical care time) Medications  morphine 4 MG/ML injection 4 mg (4 mg Intravenous Not Given 01/10/15 0415)   ondansetron (ZOFRAN) injection 4 mg (4 mg Intravenous Not Given 01/10/15 0415)  sodium chloride 0.9 % bolus 1,000 mL (0 mLs Intravenous Stopped 01/10/15 0415)  iohexol (OMNIPAQUE) 300 MG/ML solution 25 mL (25 mLs Oral Contrast Given 01/10/15 0244)  iohexol (OMNIPAQUE) 300 MG/ML solution 100 mL (100 mLs Intravenous Contrast Given 01/10/15 0329)    Labs Review Labs Reviewed  LIPASE, BLOOD - Abnormal; Notable for the following:    Lipase 14 (*)    All other components within normal limits  COMPREHENSIVE METABOLIC PANEL - Abnormal; Notable for the following:    Glucose, Bld 152 (*)    ALT 12 (*)    All other components within normal limits  CBC - Abnormal; Notable for the following:    Hemoglobin 9.8 (*)    HCT 32.0 (*)    MCV 73.1 (*)    MCH 22.4 (*)    RDW 17.7 (*)    All other components within normal limits  URINALYSIS, ROUTINE W REFLEX MICROSCOPIC (NOT AT [facility]) - Abnormal; Notable for the following:    APPearance CLOUDY (*)    Specific Gravity, Urine 1.031 (*)    All other components within normal limits  I-STAT BETA HCG BLOOD, ED ([facility], [facility], [facility] ONLY)    Imaging Review Ct Abdomen Pelvis W Contrast  01/10/2015   CLINICAL DATA:  Right lower quadrant pain for 2 days.  EXAM: CT ABDOMEN AND PELVIS WITH CONTRAST  TECHNIQUE: Multidetector CT imaging of the abdomen and pelvis was performed using the standard protocol following bolus administration of intravenous contrast.  CONTRAST:  168mL OMNIPAQUE IOHEXOL 300 MG/ML  SOLN  COMPARISON:  None.  FINDINGS: Lower chest:  The included lung bases are clear.  Hepatobiliary: Gallbladder is mildly distended. Gallbladder wall calcifications noted. Calcification in the region of the gallbladder neck, may reflect gallstone versus additional wall calcifications. No pericholecystic inflammatory change. No focal hepatic lesion.  Pancreas: Normal. No ductal dilatation or surrounding inflammatory change.  Spleen: Normal.  Adrenals/Urinary Tract: Symmetric renal  enhancement and excretion. No hydronephrosis or localizing abnormality. Adrenal glands are normal.  Stomach/Bowel: The appendix is normal. Stomach is physiologically distended. There are no dilated or thickened bowel loops. Small volume of colonic stool without colonic wall thickening.  Vascular/Lymphatic: No retroperitoneal adenopathy. Abdominal aorta is normal in caliber.  Reproductive: Contour deformity of the uterine fundus, likely a small fibroid. Right ovary is normal in size, left ovary is not well seen. No adnexal mass.  Bladder:  Physiologically distended.  Other: No free air, free fluid, or intra-abdominal fluid collection. Small fat containing umbilical hernia. Within the midline lower ventral anterior abdominal wall is a 3.8 x 2.5 x 3.7 cm soft tissue density of with irregular borders.  Musculoskeletal: There are no acute or suspicious osseous abnormalities. Postsurgical change in the right proximal femur, partially included.  IMPRESSION: 1. Normal appendix. 2. Gallbladder wall calcifications. Calcification in the region of the gallbladder neck, may reflect gallstone versus additional wall calcifications. Ultrasound evaluation could be considered for further evaluation. 3. Soft tissue density lesion in the ventral anterior abdominal wall measuring 3.8 cm. This may reflect postsurgical scar,  desmoid lesion, or less likely focal endometriosis. In the absence of symptoms referable to this region, this is of doubtful clinical significance. 4. Probable uterine fibroid.   Electronically Signed   By: Jeb Levering M.D.   On: 01/10/2015 03:54   US Abdomen Limited Ruq  01/10/2015   CLINICAL DATA:  Right upper quadrant abdominal pain for 2 days.  EXAM: US ABDOMEN LIMITED - RIGHT UPPER QUADRANT  COMPARISON:  CT earlier this day.  FINDINGS: Gallbladder:  No gallstones or wall thickening visualized. The gallbladder wall calcifications on prior CT are not well seen sonographically. No sonographic Murphy sign  noted.  Common bile duct:  Diameter: 7.6 mm proximally, distal-most portion obscured.  Liver:  No focal lesion identified. Diffusely increased and heterogeneous in parenchymal echogenicity. Normal directional flow in the main portal vein.  Technically limited evaluation by body habitus.  IMPRESSION: 1. No sonographic findings of acute cholecystitis. No cholelithiasis. The gallbladder wall calcifications on prior CT are not resolved sonographically. 2. Prominence of common bile duct measuring 28mm. This may be normal for this patient, as there is normal tapering distally on CT.   Electronically Signed   By: Jeb Levering M.D.   On: 01/10/2015 05:03     EKG Interpretation None      MDM   Final diagnoses:  Abdominal pain in female patient    Filed Vitals:   01/10/15 0500  BP: 145/74  Pulse: 63  Temp:   Resp: 18     Afebrile, NAD, non-toxic appearing, AAOx4.  I have reviewed nursing notes, vital signs, and all lab and all imaging results as noted above. Patient is nontoxic, nonseptic appearing, in no apparent distress.  Patient's pain and other symptoms adequately managed in emergency department.  Fluid bolus given.  Labs, imaging and vitals reviewed.  Patient does not meet the SIRS or Sepsis criteria.  On repeat exam patient does not have a surgical abdomin and there are no peritoneal signs.  No indication of appendicitis, bowel obstruction, bowel perforation, cholecystitis, diverticulitis, PID or ectopic pregnancy.  Patient discharged home with symptomatic treatment and given strict instructions for follow-up with their primary care physician.  I have also discussed reasons to return immediately to the ER.  Patient expresses understanding and agrees with plan. Patient is stable at time of discharge      Baron Sane, PA-C 01/10/15 0543  Linton Flemings, MD 01/11/15 [phone]

## 2016-11-13 ENCOUNTER — Emergency Department (HOSPITAL_COMMUNITY)
Admission: EM | Admit: 2016-11-13 | Discharge: 2016-11-14 | Disposition: A | Discharge status: Home / Self Care | Payer: Medicaid Other | Attending: Emergency Medicine | Admitting: Emergency Medicine

## 2016-11-13 DIAGNOSIS — R600 Localized edema: Secondary | ICD-10-CM | POA: Insufficient documentation

## 2016-11-13 DIAGNOSIS — I10 Essential (primary) hypertension: Secondary | ICD-10-CM | POA: Insufficient documentation

## 2016-11-13 DIAGNOSIS — K047 Periapical abscess without sinus: Secondary | ICD-10-CM | POA: Insufficient documentation

## 2016-11-13 DIAGNOSIS — R220 Localized swelling, mass and lump, head: Secondary | ICD-10-CM

## 2016-11-13 MED ORDER — ACETAMINOPHEN 500 MG PO TABS
1000.0000 mg | ORAL_TABLET | Freq: Once | ORAL | Status: AC
  Administered 2016-11-13: 1000 mg via ORAL
  Filled 2016-11-13: qty 2

## 2016-11-13 MED ORDER — CLINDAMYCIN HCL 150 MG PO CAPS
450.0000 mg | ORAL_CAPSULE | Freq: Once | ORAL | Status: AC
Start: 2016-11-13 — End: 2016-11-13
  Administered 2016-11-13: 450 mg via ORAL
  Filled 2016-11-13: qty 1

## 2016-11-13 MED ORDER — DIPHENHYDRAMINE HCL 25 MG PO CAPS
25.0000 mg | ORAL_CAPSULE | Freq: Once | ORAL | Status: AC
  Administered 2016-11-13: 25 mg via ORAL
  Filled 2016-11-13: qty 1

## 2016-11-13 NOTE — ED Notes (Signed)
Pt reports she took a friend's Vicodin and her already swollen jaw is now more swollen. Airway intact.

## 2016-11-13 NOTE — ED Provider Notes (Signed)
[facility] DEPT Provider Note   CSN: [id number] Arrival date & time: 11/13/16  2036     History   Chief Complaint Chief Complaint  Patient presents with  . Facial Swelling    HPI Brooke Hughes is a 46 y.o. female with gradual in onset right sidedfacial, upper/lower jaw swelling associated with dental pain x 2-3 days.  Patient took a friend's hydrocodone for pain, which temporarily helped with the pain but the swelling got worse. Associated symptoms include low grade fever, chills and nausea. Patient think she may have a tooth infection.   No throat swelling or tightness, cough, CP, SOB, vomiting, abdominal pain, rash, light-headedness. Patient has taken percocet in the past without adverse reactions.   HPI  Past Medical History:  Diagnosis Date  . Hypertension   . Obesity     There are no active problems to display for this patient.   Past Surgical History:  Procedure Laterality Date  . JOINT REPLACEMENT      OB History    No data available       Home Medications    Prior to Admission medications   Medication Sig Start Date End Date Taking? Authorizing Provider  cephALEXin (KEFLEX) 500 MG capsule Take 1 capsule (500 mg total) by mouth 4 (four) times daily. Patient not taking: Reported on 01/10/2015 03/18/13   Etta Quill, NP  clindamycin (CLEOCIN) 150 MG capsule Take 3 capsules (450 mg total) by mouth 3 (three) times daily. 11/14/16 11/21/16  Kinnie Feil, PA-C  EPINEPHrine 0.15 MG/0.15ML IJ injection Inject 0.15 mLs (0.15 mg total) into the muscle as needed for anaphylaxis. 11/14/16   Kinnie Feil, PA-C  HYDROcodone-acetaminophen (NORCO/VICODIN) 5-325 MG per tablet Take 1-2 tablets by mouth every 6 (six) hours as needed for severe pain. 01/10/15   Piepenbrink, Anderson Malta, PA-C  ibuprofen (ADVIL,MOTRIN) 800 MG tablet Take 1 tablet (800 mg total) by mouth 3 (three) times daily. Patient not taking: Reported on 01/10/2015 03/18/13   Etta Quill, NP    ibuprofen (ADVIL,MOTRIN) 800 MG tablet Take 1 tablet (800 mg total) by mouth 3 (three) times daily. Patient not taking: Reported on 01/10/2015 12/18/13   Jeannett Senior, PA-C  Multiple Vitamin (MULTIVITAMIN WITH MINERALS) TABS tablet Take 1 tablet by mouth daily.    [provider]  ondansetron (ZOFRAN ODT) 4 MG disintegrating tablet Take 1 tablet (4 mg total) by mouth every 8 (eight) hours as needed for nausea. 01/10/15   Piepenbrink, Anderson Malta, PA-C  oxyCODONE-acetaminophen (PERCOCET) 5-325 MG per tablet Take 1 tablet by mouth every 4 (four) hours as needed for severe pain. Patient not taking: Reported on 01/10/2015 12/18/13   Jeannett Senior, PA-C  oxyCODONE-acetaminophen (PERCOCET/ROXICET) 5-325 MG per tablet Take 1 tablet by mouth every 6 (six) hours as needed for pain (use for pain not controlled with ibuprofen). Patient not taking: Reported on 01/10/2015 03/18/13   Etta Quill, NP    Family History No family history on file.  Social History Social History  Substance Use Topics  . Smoking status: Never Smoker  . Smokeless tobacco: Not on file  . Alcohol use No     Allergies   Penicillins   Review of Systems Review of Systems  Constitutional: Positive for chills and fever.  HENT: Positive for dental problem and facial swelling. Negative for drooling, sore throat and trouble swallowing.   Eyes: Negative for visual disturbance.  Respiratory: Negative for cough, chest tightness and shortness of breath.   Cardiovascular: Negative for chest pain  and palpitations.  Gastrointestinal: Positive for nausea. Negative for abdominal pain, constipation, diarrhea and vomiting.  Genitourinary: Negative for difficulty urinating.  Skin: Negative for color change.       +Swelling to right face and upper lip  Neurological: Positive for headaches. Negative for light-headedness.     Physical Exam Updated Vital Signs BP (!) 195/104   Pulse 81   Temp (!) 100.8 F (38.2 C) (Oral)    Resp 16   SpO2 100%   Physical Exam  Constitutional: She is oriented to person, place, and time. She appears well-developed and well-nourished. No distress.  NAD.  HENT:  Head: Normocephalic and atraumatic.  Right Ear: External ear normal.  Left Ear: External ear normal.  Nose: Nose normal.  Mouth/Throat: Oropharynx is clear and moist and mucous membranes are normal. Normal dentition. Dental abscesses and dental caries present. No oropharyngeal exudate, posterior oropharyngeal edema or posterior oropharyngeal erythema. Tonsils are 1+ on the right. Tonsils are 1+ on the left.    Poor dentition, multiple missing teeth Mild tenderness, erythema and edema at tooth #5 and 28. There is a tiny abscess at gum line of tooth #28, purulent discharge expressed with light pressure. Tenderness to gingival margins around these teeth. Tenderness along attached gingiva of tooth #5 and #28. Mild right sided maxillary tenderness and edema.   Eyes: Conjunctivae and EOM are normal. Pupils are equal, round, and reactive to light. No scleral icterus.  No peri-orbital edema. EOMs intact bilaterally without reported pain  Neck: Normal range of motion. Neck supple. No JVD present.  Cardiovascular: Normal rate, regular rhythm and normal heart sounds.   No murmur heard. Pulmonary/Chest: Effort normal and breath sounds normal. She has no wheezes.  Abdominal: Soft. There is no tenderness.  Musculoskeletal: Normal range of motion. She exhibits no deformity.  Lymphadenopathy:    She has no cervical adenopathy.  Neurological: She is alert and oriented to person, place, and time.  Skin: Skin is warm and dry. Capillary refill takes less than 2 seconds.  Psychiatric: She has a normal mood and affect. Her behavior is normal. Judgment and thought content normal.  Nursing note and vitals reviewed.    ED Treatments / Results  Labs (all labs ordered are listed, but only abnormal results are displayed) Labs Reviewed -  No data to display  EKG  EKG Interpretation None       Radiology No results found.  Procedures Procedures (including critical care time)  Medications Ordered in ED Medications  acetaminophen (TYLENOL) tablet 1,000 mg (1,000 mg Oral Given 11/13/16 2353)  diphenhydrAMINE (BENADRYL) capsule 25 mg (25 mg Oral Given 11/13/16 2353)  clindamycin (CLEOCIN) capsule 450 mg (450 mg Oral Given 11/13/16 2353)     Initial Impression / Assessment and Plan / ED Course  I have reviewed the triage vital signs and the nursing notes.  Pertinent labs & imaging results that were available during my care of the patient were reviewed by me and considered in my medical decision making (see chart for details).    Dental pain associated with dental abscess.  Patient has low grade fever but is non toxic appearing, swallowing secretions well without hot potato voice. Exam unconcerning for Ludwig's angina or other deep tissue infection in neck.As there is gum swelling with fluctuance, erythema, and facial swelling, will treat with antibiotic. Urged patient to follow-up with dentist.  Patient given dentist contact info, encouraged to follow up in 1-2 days for ultimate management of dental pain and overall dental  health. Strict ED return precautions given. Pt is aware of red flag symptoms that would warrant return to ED for re-evaluation and further treatment. Patient voices understanding and is agreeable to plan.   History not concerning for anaphylaxis or allergic reaction. Patient had right sided facial and lip swelling prior to taking her friend's hydrocodone. No s/s of anaphylaxis. Given patient's concern, patient was discharged with epipen. Provided epipen instructions.   Final Clinical Impressions(s) / ED Diagnoses   Final diagnoses:  Dental abscess  Facial swelling    New Prescriptions Discharge Medication List as of 11/14/2016 12:19 AM    START taking these medications   Details  clindamycin  (CLEOCIN) 150 MG capsule Take 3 capsules (450 mg total) by mouth 3 (three) times daily., Starting Fri 11/14/2016, Until Fri 11/21/2016, Print    EPINEPHrine 0.15 MG/0.15ML IJ injection Inject 0.15 mLs (0.15 mg total) into the muscle as needed for anaphylaxis., Starting Fri 11/14/2016, Print         Kinnie Feil, PA-C 11/14/16 6168    Varney Biles, MD 11/14/16 1655

## 2016-11-13 NOTE — ED Triage Notes (Signed)
Pt took a hydrocodone for a toothache and her face, lips and nose started to swell, she states that she feels a little short of breath but breathing fine right now Pt has never taken hydrocodone before

## 2016-11-14 MED ORDER — EPINEPHRINE 0.15 MG/0.15ML IJ SOAJ: 0.1500 mg | INTRAMUSCULAR | 0 refills | Status: DC | PRN

## 2016-11-14 MED ORDER — CLINDAMYCIN HCL 150 MG PO CAPS: 450.0000 mg | ORAL_CAPSULE | Freq: Three times a day (TID) | ORAL | 0 refills | Status: AC

## 2016-11-14 NOTE — Discharge Instructions (Signed)
You have a dental abscess. This is what is causing your facial swelling and dental pain.  It is unlikely that the hydrocodone you took caused your swelling, since you had the swelling and dental pain before you took this medicine.   You have a low grade fever, consistent with an infection.   Please take antibiotics as prescribed.  For pain and inflammation: Ibuprofen 600 mg every 8 hours Tylenol 1000 mg every 8 hours  Contact dentist as soon as possible for treatment. I have given you three dentists within the area that you can contact.  I have also given you dental resources with phone numbers and contact information for you to call and make an appointment. You will continue to have facial swelling and dental pain until you are evaluated by and receive treatment from a dentist.   Return to the emergency department if you develop difficulty opening/closing your jaw, neck stiffness, drooling, fevers, throat swelling or voice changes.  Given you initial concern of allergy reaction, I have given you a prescription for an epipen. Please use this pen if you experience: skin rash, nausea, vomiting, difficulty breathing, rapid breathing, shortness of breath, light-headedness, hives, swelling or skin, tightness of throat. If you use your epipen you have to come to the emergency department for observation.

## 2017-01-29 ENCOUNTER — Encounter (HOSPITAL_COMMUNITY): Payer: Self-pay | Admitting: Emergency Medicine

## 2017-01-29 ENCOUNTER — Emergency Department (HOSPITAL_COMMUNITY)
Admission: EM | Admit: 2017-01-29 | Discharge: 2017-01-29 | Disposition: A | Discharge status: Home / Self Care | Payer: No Typology Code available for payment source | Attending: Emergency Medicine | Admitting: Emergency Medicine

## 2017-01-29 DIAGNOSIS — I10 Essential (primary) hypertension: Secondary | ICD-10-CM | POA: Insufficient documentation

## 2017-01-29 DIAGNOSIS — Y999 Unspecified external cause status: Secondary | ICD-10-CM | POA: Diagnosis not present

## 2017-01-29 DIAGNOSIS — S59912A Unspecified injury of left forearm, initial encounter: Secondary | ICD-10-CM | POA: Diagnosis present

## 2017-01-29 DIAGNOSIS — Y9389 Activity, other specified: Secondary | ICD-10-CM | POA: Diagnosis not present

## 2017-01-29 DIAGNOSIS — S51811A Laceration without foreign body of right forearm, initial encounter: Secondary | ICD-10-CM | POA: Insufficient documentation

## 2017-01-29 DIAGNOSIS — Y92410 Unspecified street and highway as the place of occurrence of the external cause: Secondary | ICD-10-CM | POA: Diagnosis not present

## 2017-01-29 DIAGNOSIS — S51822A Laceration with foreign body of left forearm, initial encounter: Secondary | ICD-10-CM | POA: Insufficient documentation

## 2017-01-29 MED ORDER — LIDOCAINE 5 % EX PTCH: 1.0000 | MEDICATED_PATCH | CUTANEOUS | 0 refills | Status: DC

## 2017-01-29 MED ORDER — IBUPROFEN 600 MG PO TABS: 600.0000 mg | ORAL_TABLET | Freq: Four times a day (QID) | ORAL | 0 refills | Status: DC | PRN

## 2017-01-29 MED ORDER — METHOCARBAMOL 500 MG PO TABS: 500.0000 mg | ORAL_TABLET | Freq: Two times a day (BID) | ORAL | 0 refills | Status: DC
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
## 2017-01-29 NOTE — Discharge Instructions (Signed)
Expect your soreness to increase over the next 2-3 days. Take it easy, but do not lay around too much as this may make any stiffness worse.  Antiinflammatory medications: Take 600 mg of ibuprofen every 6 hours or 440 mg (over the counter dose) to 500 mg (prescription dose) of naproxen every 12 hours or for the next 3 days. After this time, these medications may be used as needed for pain. Take these medications with food to avoid upset stomach. Choose only one of these medications, do not take them together.  Tylenol: Should you continue to have additional pain while taking the ibuprofen or naproxen, you may add in tylenol as needed. Your daily total maximum amount of tylenol from all sources should be limited to 4000mg /day for persons without liver problems, or 2000mg /day for those with liver problems. Muscle relaxer: Robaxin is a muscle relaxer and may help loosen stiff muscles. Do not take the Robaxin while driving or performing other dangerous activities.  Lidocaine patches: These are available via either prescription or over-the-counter. The over-the-counter option may be more economical one and are likely just as effective. There are multiple over-the-counter brands, such as Salonpas. Exercises: Be sure to perform the attached exercises starting with three times a week and working up to performing them daily. This is an essential part of preventing long term problems.   Wound management: Clean the wound and surrounding area gently with tap water and mild soap. Rinse well and blot dry. Do not scrub the wound, as this may cause the wound edges to come apart. Soaking the area in warm Epsom salt baths twice a day for about 15 minutes can help with wound healing and to help any remaining small pieces of glass work themselves out. Clean the wound daily to prevent infection. Do not use cleaners such as hydrogen peroxide or alcohol.   Scar reduction: Application of a topical antibiotic ointment, such as  Neosporin, after the wound has begun to close and heal well can decrease scab formation and reduce scarring. After the wound has healed, application of ointments such as Aquaphor can also reduce scar formation.  The key to scar reduction is keeping the skin well hydrated and supple. Drinking plenty of water throughout the day (At least eight 8oz glasses of water a day) is essential to staying well hydrated.  Sun exposure: Keep the wound out of the sun. After the wound has healed, continue to protect it from the sun by wearing protective clothing or applying sunscreen.  Return to the ED should signs of infection arise, such as spreading redness, puffiness/swelling, pus draining from the wound, severe increase in pain, fever over 100.70F, or any other major issues.  Follow up with a primary care provider for any future management of these complaints.

## 2019-05-01 ENCOUNTER — Other Ambulatory Visit: Payer: Self-pay

## 2019-05-01 ENCOUNTER — Encounter (HOSPITAL_COMMUNITY): Payer: Self-pay

## 2019-05-01 DIAGNOSIS — Z79899 Other long term (current) drug therapy: Secondary | ICD-10-CM | POA: Insufficient documentation

## 2019-05-01 DIAGNOSIS — B349 Viral infection, unspecified: Secondary | ICD-10-CM | POA: Insufficient documentation

## 2019-05-01 DIAGNOSIS — R739 Hyperglycemia, unspecified: Secondary | ICD-10-CM | POA: Insufficient documentation

## 2019-05-01 DIAGNOSIS — L02415 Cutaneous abscess of right lower limb: Secondary | ICD-10-CM | POA: Insufficient documentation

## 2019-05-01 DIAGNOSIS — I10 Essential (primary) hypertension: Secondary | ICD-10-CM | POA: Insufficient documentation

## 2019-05-01 DIAGNOSIS — Z9114 Patient's other noncompliance with medication regimen: Secondary | ICD-10-CM | POA: Insufficient documentation
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
## 2019-05-02 ENCOUNTER — Emergency Department (HOSPITAL_COMMUNITY)
Admission: EM | Admit: 2019-05-02 | Discharge: 2019-05-02 | Disposition: A | Discharge status: Home / Self Care | Payer: Self-pay | Attending: Emergency Medicine | Admitting: Emergency Medicine

## 2019-05-02 ENCOUNTER — Emergency Department (HOSPITAL_COMMUNITY): Payer: Self-pay

## 2019-05-02 DIAGNOSIS — J988 Other specified respiratory disorders: Secondary | ICD-10-CM

## 2019-05-02 DIAGNOSIS — R059 Cough, unspecified: Secondary | ICD-10-CM

## 2019-05-02 DIAGNOSIS — L0291 Cutaneous abscess, unspecified: Secondary | ICD-10-CM

## 2019-05-02 DIAGNOSIS — I158 Other secondary hypertension: Secondary | ICD-10-CM

## 2019-05-02 DIAGNOSIS — B9789 Other viral agents as the cause of diseases classified elsewhere: Secondary | ICD-10-CM

## 2019-05-02 DIAGNOSIS — R05 Cough: Secondary | ICD-10-CM

## 2019-05-02 DIAGNOSIS — R739 Hyperglycemia, unspecified: Secondary | ICD-10-CM

## 2019-05-02 LAB — BASIC METABOLIC PANEL
Anion gap: 8 (ref 5–15)
BUN: 11 mg/dL (ref 6–20)
CO2: 25 mmol/L (ref 22–32)
Calcium: 8.5 mg/dL — ABNORMAL LOW (ref 8.9–10.3)
Chloride: 101 mmol/L (ref 98–111)
Creatinine, Ser: 0.86 mg/dL (ref 0.44–1.00)
GFR calc Af Amer: >60 mL/min (ref >60)
GFR calc non Af Amer: >60 mL/min (ref >60)
Glucose, Bld: 269 mg/dL — ABNORMAL HIGH (ref 70–99)
Potassium: 4 mmol/L (ref 3.5–5.1)
Sodium: 134 mmol/L — ABNORMAL LOW (ref 135–145)

## 2019-05-02 LAB — CBC WITH DIFFERENTIAL/PLATELET
Abs Immature Granulocytes: 0.05 10*3/uL (ref 0.00–0.07)
Basophils Absolute: 0 10*3/uL (ref 0.0–0.1)
Basophils Relative: 0 %
Eosinophils Absolute: 0 10*3/uL (ref 0.0–0.5)
Eosinophils Relative: 1 %
HCT: 34.2 % — ABNORMAL LOW (ref 36.0–46.0)
Hemoglobin: 10.2 g/dL — ABNORMAL LOW (ref 12.0–15.0)
Immature Granulocytes: 1 %
Lymphocytes Relative: 19 %
Lymphs Abs: 1.2 10*3/uL (ref 0.7–4.0)
MCH: 22.5 pg — ABNORMAL LOW (ref 26.0–34.0)
MCHC: 29.8 g/dL — ABNORMAL LOW (ref 30.0–36.0)
MCV: 75.5 fL — ABNORMAL LOW (ref 80.0–100.0)
Monocytes Absolute: 0.7 10*3/uL (ref 0.1–1.0)
Monocytes Relative: 11 %
Neutro Abs: 4.3 10*3/uL (ref 1.7–7.7)
Neutrophils Relative %: 68 %
Platelets: 333 10*3/uL (ref 150–400)
RBC: 4.53 MIL/uL (ref 3.87–5.11)
RDW: 16.7 % — ABNORMAL HIGH (ref 11.5–15.5)
WBC: 6.2 10*3/uL (ref 4.0–10.5)
nRBC: 0 % (ref 0.0–0.2)

## 2019-05-02 MED ORDER — METFORMIN HCL 500 MG PO TABS: 500.0000 mg | ORAL_TABLET | Freq: Two times a day (BID) | ORAL | 1 refills | Status: DC

## 2019-05-02 MED ORDER — SULFAMETHOXAZOLE-TRIMETHOPRIM 800-160 MG PO TABS
1.0000 | ORAL_TABLET | Freq: Once | ORAL | Status: AC
  Administered 2019-05-02: 1 via ORAL
  Filled 2019-05-02: qty 1

## 2019-05-02 MED ORDER — LISINOPRIL 10 MG PO TABS
10.0000 mg | ORAL_TABLET | Freq: Once | ORAL | Status: AC
  Administered 2019-05-02: 10 mg via ORAL
  Filled 2019-05-02: qty 1

## 2019-05-02 MED ORDER — KETOROLAC TROMETHAMINE 15 MG/ML IJ SOLN
15.0000 mg | Freq: Once | INTRAMUSCULAR | Status: AC
  Administered 2019-05-02: 15 mg via INTRAVENOUS
  Filled 2019-05-02: qty 1

## 2019-05-02 MED ORDER — SULFAMETHOXAZOLE-TRIMETHOPRIM 800-160 MG PO TABS: 1.0000 | ORAL_TABLET | Freq: Two times a day (BID) | ORAL | 0 refills | Status: AC

## 2019-05-02 MED ORDER — METFORMIN HCL 500 MG PO TABS
500.0000 mg | ORAL_TABLET | Freq: Once | ORAL | Status: AC
  Administered 2019-05-02: 500 mg via ORAL
  Filled 2019-05-02: qty 1

## 2019-05-02 MED ORDER — LISINOPRIL 10 MG PO TABS: 10.0000 mg | ORAL_TABLET | Freq: Every day | ORAL | 1 refills | Status: DC

## 2019-05-02 NOTE — Discharge Instructions (Signed)
I provided you with a coupon for Bactrim.  You can pick this up at [facility].  Your lisinopril and metformin are also less than $10 at [facility].

## 2019-05-02 NOTE — ED Provider Notes (Signed)
[facility] DEPT Provider Note  CSN: [id number] Arrival date & time: 05/01/19 2311  Chief Complaint(s) Fever  HPI Brooke Hughes is a 48 y.o. female with a history of hypertension noncompliant with medications due to financial constraints presents to the emergency department with subjective fevers and generalized malaise and myalgias as well as cough.  Patient reports that she was exposed to person who tested positive for COVID-19.  She was last in contact with her on Monday (7 days ago).  This person's test return on Thursday.  Patient began feeling symptoms on Thursday or Friday.  Denies any nausea or vomiting.  No diarrhea.  Patient does endorse numerous multiple abscesses on the right medial thigh, which she drained manually.  HPI  Past Medical History Past Medical History:  Diagnosis Date  . Hypertension   . Obesity    There are no active problems to display for this patient.  Home Medication(s) Prior to Admission medications   Medication Sig Start Date End Date Taking? Authorizing Provider  EPINEPHrine 0.15 MG/0.15ML IJ injection Inject 0.15 mLs (0.15 mg total) into the muscle as needed for anaphylaxis. 11/14/16   Kinnie Feil, PA-C  HYDROcodone-acetaminophen (NORCO/VICODIN) 5-325 MG per tablet Take 1-2 tablets by mouth every 6 (six) hours as needed for severe pain. 01/10/15   Piepenbrink, Anderson Malta, PA-C  ibuprofen (ADVIL,MOTRIN) 600 MG tablet Take 1 tablet (600 mg total) by mouth every 6 (six) hours as needed. 01/29/17   Joy, Shawn C, PA-C  lidocaine (LIDODERM) 5 % Place 1 patch onto the skin daily. Remove & Discard patch within 12 hours or as directed by MD 01/29/17   Joy, Shawn C, PA-C  lisinopril (ZESTRIL) 10 MG tablet Take 1 tablet (10 mg total) by mouth daily. 05/02/19 07/01/19  Fatima Blank, MD  metFORMIN (GLUCOPHAGE) 500 MG tablet Take 1 tablet (500 mg total) by mouth 2 (two) times daily. 05/02/19 07/01/19  Fatima Blank,  MD  methocarbamol (ROBAXIN) 500 MG tablet Take 1 tablet (500 mg total) by mouth 2 (two) times daily. 01/29/17   Joy, Shawn C, PA-C  Multiple Vitamin (MULTIVITAMIN WITH MINERALS) TABS tablet Take 1 tablet by mouth daily.    [provider]  sulfamethoxazole-trimethoprim (BACTRIM DS) 800-160 MG tablet Take 1 tablet by mouth 2 (two) times daily for 7 days. 05/02/19 05/09/19  Fatima Blank, MD                                                                                                                                    Past Surgical History Past Surgical History:  Procedure Laterality Date  . FEMUR FRACTURE SURGERY     r/hip  . JOINT REPLACEMENT     Family History Family History  Problem Relation Age of Onset  . Diabetes Mother   . Diabetes Father     Social History Social History   Tobacco Use  . Smoking  status: Never Smoker  . Smokeless tobacco: Never Used  Substance Use Topics  . Alcohol use: No  . Drug use: No   Allergies Penicillins and Vicodin [hydrocodone-acetaminophen]  Review of Systems Review of Systems All other systems are reviewed and are negative for acute change except as noted in the HPI  Physical Exam Vital Signs  I have reviewed the triage vital signs BP (!) 187/97   Pulse 76   Temp 98 F (36.7 C) (Oral)   Resp 18   Ht 6\' 2"  (1.88 m)   Wt (!) 155.1 kg   SpO2 100%   BMI 43.91 kg/m   Physical Exam Vitals signs reviewed.  Constitutional:      General: She is not in acute distress.    Appearance: She is well-developed. She is not diaphoretic.  HENT:     Head: Normocephalic and atraumatic.     Nose: Nose normal.  Eyes:     General: No scleral icterus.       Right eye: No discharge.        Left eye: No discharge.     Conjunctiva/sclera: Conjunctivae normal.     Pupils: Pupils are equal, round, and reactive to light.  Neck:     Musculoskeletal: Normal range of motion and neck supple.  Cardiovascular:     Rate and Rhythm:  Normal rate and regular rhythm.     Heart sounds: No murmur. No friction rub. No gallop.   Pulmonary:     Effort: Pulmonary effort is normal. No respiratory distress.     Breath sounds: Normal breath sounds. No stridor. No rales.  Abdominal:     General: There is no distension.     Palpations: Abdomen is soft.     Tenderness: There is no abdominal tenderness.  Musculoskeletal:        General: No tenderness.  Skin:    General: Skin is warm and dry.     Findings: Abscess and erythema present. No rash.       Neurological:     Mental Status: She is alert and oriented to person, place, and time.     ED Results and Treatments Labs (all labs ordered are listed, but only abnormal results are displayed) Labs Reviewed  CBC WITH DIFFERENTIAL/PLATELET - Abnormal; Notable for the following components:      Result Value   Hemoglobin 10.2 (*)    HCT 34.2 (*)    MCV 75.5 (*)    MCH 22.5 (*)    MCHC 29.8 (*)    RDW 16.7 (*)    All other components within normal limits  BASIC METABOLIC PANEL - Abnormal; Notable for the following components:   Sodium 134 (*)    Glucose, Bld 269 (*)    Calcium 8.5 (*)    All other components within normal limits  EKG  EKG Interpretation  Date/Time:  Monday May 02 2019 02:04:28 EST Ventricular Rate:  83 PR Interval:    QRS Duration: 94 QT Interval:  347 QTC Calculation: 408 R Axis:   -12 Text Interpretation: Sinus rhythm Anterior infarct, old Baseline wander in lead(s) V6 NO STEMI. No old tracing to compare Confirmed by Addison Lank [phone]) on 05/02/2019 2:10:19 AM      Radiology Cxr Port 1 View - Chest Pain  Result Date: 05/02/2019 CLINICAL DATA:  Fever and cough. EXAM: PORTABLE CHEST 1 VIEW COMPARISON:  None. FINDINGS: Normal heart size and mediastinal contours. No acute infiltrate or edema. No effusion or pneumothorax.  No acute osseous findings. IMPRESSION: Negative chest. Electronically Signed   By: Monte Fantasia M.D.   On: 05/02/2019 05:13    Pertinent labs & imaging results that were available during my care of the patient were reviewed by me and considered in my medical decision making (see chart for details).  Medications Ordered in ED Medications  lisinopril (ZESTRIL) tablet 10 mg (10 mg Oral Given 05/02/19 0343)  metFORMIN (GLUCOPHAGE) tablet 500 mg (500 mg Oral Given 05/02/19 0343)  sulfamethoxazole-trimethoprim (BACTRIM DS) 800-160 MG per tablet 1 tablet (1 tablet Oral Given 05/02/19 0343)  ketorolac (TORADOL) 15 MG/ML injection 15 mg (15 mg Intravenous Given 05/02/19 0435)                                                                                                                                    Procedures Procedures  (including critical care time)  Medical Decision Making / ED Course I have reviewed the nursing notes for this encounter and the patient's prior records (if available in EHR or on provided paperwork).   Brooke Hughes was evaluated in Emergency Department on 05/02/2019 for the symptoms described in the history of present illness. She was evaluated in the context of the global COVID-19 pandemic, which necessitated consideration that the patient might be at risk for infection with the SARS-CoV-2 virus that causes COVID-19. Institutional protocols and algorithms that pertain to the evaluation of patients at risk for COVID-19 are in a state of rapid change based on information released by regulatory bodies including the CDC and federal and state organizations. These policies and algorithms were followed during the patient's care in the ED.  Patient presents with subjective fevers, cough, myalgias and malaise in the setting of positive Covid exposure.  Patient is satting well on room air.  She has a low-grade temperature.  Low clear to auscultation bilaterally.  Noted to have  multiple draining abscesses in the right thigh with surrounding erythema and mild cellulitis.  Patient denied a history of diabetes but has not seen a physician in several years.  Patient also noted to be hypertensive.  Screening labs obtain and reassuring.  No leukocytosis.  Stable hemoglobin.  No electrolyte derangements or renal sufficiency.  Patient does have hyperglycemia without evidence of  DKA.  No endorgan damage.  Given her reported lisinopril.  Started on Metformin.  Given Bactrim for multiple abscesses with cellulitis.  Chest x-ray reassuring.      Final Clinical Impression(s) / ED Diagnoses Final diagnoses:  Cough  Viral respiratory illness  Abscess  Other secondary hypertension  Hyperglycemia    The patient appears reasonably screened and/or stabilized for discharge and I doubt any other medical condition or other [facility] requiring further screening, evaluation, or treatment in the ED at this time prior to discharge.  Disposition: Discharge  Condition: Good  I have discussed the results, Dx and Tx plan with the patient who expressed understanding and agree(s) with the plan. Discharge instructions discussed at great length. The patient was given strict return precautions who verbalized understanding of the instructions. No further questions at time of discharge.    ED Discharge Orders         Ordered    lisinopril (ZESTRIL) 10 MG tablet  Daily     05/02/19 0537    metFORMIN (GLUCOPHAGE) 500 MG tablet  2 times daily     05/02/19 0537    sulfamethoxazole-trimethoprim (BACTRIM DS) 800-160 MG tablet  2 times daily     05/02/19 0537           Follow Up: [facility] [address] [place] [phone] Schedule an appointment as soon as possible for a visit  For close follow up to assess for likely diabetes      This chart was dictated using voice recognition software.  Despite best efforts to  proofread,  errors can occur which can change the documentation meaning.   Fatima Blank, MD 05/02/19 [phone]

## 2019-09-24 ENCOUNTER — Emergency Department (HOSPITAL_COMMUNITY)
Admission: EM | Admit: 2019-09-24 | Discharge: 2019-09-24 | Disposition: A | Discharge status: Home / Self Care | Payer: Self-pay | Attending: Emergency Medicine | Admitting: Emergency Medicine

## 2019-09-24 ENCOUNTER — Encounter (HOSPITAL_COMMUNITY): Payer: Self-pay | Admitting: Emergency Medicine

## 2019-09-24 ENCOUNTER — Emergency Department (HOSPITAL_COMMUNITY): Payer: Self-pay

## 2019-09-24 ENCOUNTER — Other Ambulatory Visit: Payer: Self-pay

## 2019-09-24 DIAGNOSIS — Z20822 Contact with and (suspected) exposure to covid-19: Secondary | ICD-10-CM | POA: Insufficient documentation

## 2019-09-24 DIAGNOSIS — L02811 Cutaneous abscess of head [any part, except face]: Secondary | ICD-10-CM | POA: Insufficient documentation

## 2019-09-24 DIAGNOSIS — I10 Essential (primary) hypertension: Secondary | ICD-10-CM | POA: Insufficient documentation

## 2019-09-24 DIAGNOSIS — R072 Precordial pain: Secondary | ICD-10-CM

## 2019-09-24 DIAGNOSIS — Z9660 Presence of unspecified orthopedic joint implant: Secondary | ICD-10-CM | POA: Insufficient documentation

## 2019-09-24 DIAGNOSIS — E119 Type 2 diabetes mellitus without complications: Secondary | ICD-10-CM | POA: Insufficient documentation

## 2019-09-24 DIAGNOSIS — R0789 Other chest pain: Secondary | ICD-10-CM | POA: Insufficient documentation

## 2019-09-24 HISTORY — DX: Type 2 diabetes mellitus without complications: E11.9

## 2019-09-24 LAB — CBC
HCT: 35.1 % — ABNORMAL LOW (ref 36.0–46.0)
Hemoglobin: 10.5 g/dL — ABNORMAL LOW (ref 12.0–15.0)
MCH: 22.4 pg — ABNORMAL LOW (ref 26.0–34.0)
MCHC: 29.9 g/dL — ABNORMAL LOW (ref 30.0–36.0)
MCV: 75 fL — ABNORMAL LOW (ref 80.0–100.0)
Platelets: 265 10*3/uL (ref 150–400)
RBC: 4.68 MIL/uL (ref 3.87–5.11)
RDW: 16.9 % — ABNORMAL HIGH (ref 11.5–15.5)
WBC: 6.3 10*3/uL (ref 4.0–10.5)
nRBC: 0 % (ref 0.0–0.2)

## 2019-09-24 LAB — BASIC METABOLIC PANEL
Anion gap: 10 (ref 5–15)
BUN: 11 mg/dL (ref 6–20)
CO2: 23 mmol/L (ref 22–32)
Calcium: 8.7 mg/dL — ABNORMAL LOW (ref 8.9–10.3)
Chloride: 102 mmol/L (ref 98–111)
Creatinine, Ser: 1.15 mg/dL — ABNORMAL HIGH (ref 0.44–1.00)
GFR calc Af Amer: >60 mL/min (ref >60)
GFR calc non Af Amer: 57 mL/min — ABNORMAL LOW (ref >60)
Glucose, Bld: 216 mg/dL — ABNORMAL HIGH (ref 70–99)
Potassium: 3.8 mmol/L (ref 3.5–5.1)
Sodium: 135 mmol/L (ref 135–145)

## 2019-09-24 LAB — RESPIRATORY PANEL BY RT PCR (FLU A&B, COVID)
Influenza A by PCR: NEGATIVE
Influenza B by PCR: NEGATIVE
SARS Coronavirus 2 by RT PCR: NEGATIVE

## 2019-09-24 LAB — TROPONIN I (HIGH SENSITIVITY)
Troponin I (High Sensitivity): 25 ng/L — ABNORMAL HIGH (ref <18)
Troponin I (High Sensitivity): 25 ng/L — ABNORMAL HIGH (ref <18)

## 2019-09-24 LAB — HEPATIC FUNCTION PANEL
ALT: 16 U/L (ref 0–44)
AST: 16 U/L (ref 15–41)
Albumin: 3.5 g/dL (ref 3.5–5.0)
Alkaline Phosphatase: 75 U/L (ref 38–126)
Bilirubin, Direct: <0.1 mg/dL (ref 0.0–0.2)
Total Bilirubin: 0.5 mg/dL (ref 0.3–1.2)
Total Protein: 8 g/dL (ref 6.5–8.1)

## 2019-09-24 LAB — LIPASE, BLOOD: Lipase: 20 U/L (ref 11–51)

## 2019-09-24 MED ORDER — METFORMIN HCL 500 MG PO TABS: 500.0000 mg | ORAL_TABLET | Freq: Two times a day (BID) | ORAL | 0 refills | Status: DC

## 2019-09-24 MED ORDER — LIDOCAINE HCL (PF) 1 % IJ SOLN
5.0000 mL | Freq: Once | INTRAMUSCULAR | Status: AC
  Administered 2019-09-24: 5 mL
  Filled 2019-09-24: qty 30

## 2019-09-24 MED ORDER — DOXYCYCLINE HYCLATE 100 MG PO CAPS: 100.0000 mg | ORAL_CAPSULE | Freq: Two times a day (BID) | ORAL | 0 refills | Status: AC

## 2019-09-24 MED ORDER — LISINOPRIL 10 MG PO TABS: 10.0000 mg | ORAL_TABLET | Freq: Every day | ORAL | 0 refills | Status: DC

## 2019-09-24 MED ORDER — SODIUM CHLORIDE 0.9 % IV BOLUS
500.0000 mL | Freq: Once | INTRAVENOUS | Status: AC
  Administered 2019-09-24: 500 mL via INTRAVENOUS

## 2019-09-24 NOTE — Discharge Instructions (Signed)
You were seen in the emergency department today with chest discomfort and a scalp abscess.  We have drained the abscess and will start you on antibiotics.  Please follow closely with your primary care doctor.  I have written 1 month prescriptions for your home medications but your PCP will need to refill these in the future.  Please call the cardiology office listed to schedule the next available follow-up appointment.  If you develop new or worsening symptoms such as chest pain, shortness of breath, lightheadedness, passing out, other severe symptoms you should return to the emergency department immediately.

## 2019-09-24 NOTE — ED Triage Notes (Signed)
Patient complains of central chest pain that radiates to her right arm for the last week. Patient also states she becomes dizzy upon standing.

## 2019-09-24 NOTE — ED Provider Notes (Signed)
Emergency Department Provider Note   I have reviewed the triage vital signs and the nursing notes.   HISTORY  Chief Complaint Chest Pain   HPI Brooke Hughes is a 49 y.o. female with past medical history of elevated BMI, hypertension, diabetes presents to the emergency department with lightheadedness with standing and associated chest tightness.  The chest tightness occurs only with standing and there is lasting only for a few seconds and then resolves.  She has not passed out.  She feels mildly short of breath over the past week.  The symptoms have developed over that time but she denies any fever, sore throat, runny nose.  She does describe to me a poor appetite due to feeling like she is lost her sense of smell.  She denies any vomiting and has baseline constipation which is unchanged.  She has occasional abdominal discomfort but nothing persistent.  She did visit [facility] 2 weeks ago but did not have any known sick contacts at that time.  She has not been vaccinated for COVID-19.   Patient also notes a swollen area on the back of the scalp.  She states it had some bleeding and drainage of what appeared to be pus but that has since stopped.  She feels like the area is getting slightly bigger. Mild tenderness noted. No similar lesions in the past.   Past Medical History:  Diagnosis Date  . Diabetes mellitus without complication ([place])   . Hypertension   . Obesity     There are no problems to display for this patient.   Past Surgical History:  Procedure Laterality Date  . FEMUR FRACTURE SURGERY     r/hip  . JOINT REPLACEMENT      Allergies Vicodin [hydrocodone-acetaminophen] and Penicillins  Family History  Problem Relation Age of Onset  . Diabetes Mother   . Diabetes Father     Social History Social History   Tobacco Use  . Smoking status: Never Smoker  . Smokeless tobacco: Never Used  Substance Use Topics  . Alcohol use: No  . Drug use: No    Review  of Systems  Constitutional: No fever/chills Eyes: No visual changes. ENT: No sore throat. Loss of smell.  Cardiovascular: Positive chest pain and lightheadedness with standing.  Respiratory: Positive shortness of breath. Gastrointestinal: No abdominal pain.  No nausea, no vomiting.  No diarrhea.  No constipation. Genitourinary: Negative for dysuria. Musculoskeletal: Negative for back pain. Skin: Negative for rash. Posterior scalp abscess.  Neurological: Negative for headaches.  10-point ROS otherwise negative.  ____________________________________________   PHYSICAL EXAM:  VITAL SIGNS: ED Triage Vitals  Enc Vitals Group     BP --      Pulse Rate 09/24/19 1532 91     Resp 09/24/19 1532 18     Temp 09/24/19 1532 98.3 F (36.8 C)     Temp Source 09/24/19 1532 Oral     SpO2 09/24/19 1532 96 %     Weight --      Height 09/24/19 1533 6\' 2"  (1.88 m)   Constitutional: Alert and oriented. Well appearing and in no acute distress. Eyes: Conjunctivae are normal. Head: Atraumatic. 1 cm scalp abscess noted in the right occipital region. No surrounding induration or cellulitis.  Nose: No congestion/rhinnorhea. Mouth/Throat: Mucous membranes are moist.   Neck: No stridor.   Cardiovascular: Normal rate, regular rhythm. Good peripheral circulation. Grossly normal heart sounds.   Respiratory: Normal respiratory effort.  No retractions. Lungs CTAB. Gastrointestinal: Soft  and nontender. No distention.  Musculoskeletal: No lower extremity tenderness nor edema. No gross deformities of extremities. Neurologic:  Normal speech and language. No gross focal neurologic deficits are appreciated.  Skin:  Skin is warm, dry and intact. No rash noted.   ____________________________________________   LABS (all labs ordered are listed, but only abnormal results are displayed)  Labs Reviewed  BASIC METABOLIC PANEL - Abnormal; Notable for the following components:      Result Value   Glucose, Bld  216 (*)    Creatinine, Ser 1.15 (*)    Calcium 8.7 (*)    GFR calc non Af Amer 57 (*)    All other components within normal limits  CBC - Abnormal; Notable for the following components:   Hemoglobin 10.5 (*)    HCT 35.1 (*)    MCV 75.0 (*)    MCH 22.4 (*)    MCHC 29.9 (*)    RDW 16.9 (*)    All other components within normal limits  TROPONIN I (HIGH SENSITIVITY) - Abnormal; Notable for the following components:   Troponin I (High Sensitivity) 25 (*)    All other components within normal limits  TROPONIN I (HIGH SENSITIVITY) - Abnormal; Notable for the following components:   Troponin I (High Sensitivity) 25 (*)    All other components within normal limits  RESPIRATORY PANEL BY RT PCR (FLU A&B, COVID)  HEPATIC FUNCTION PANEL  LIPASE, BLOOD  POC SARS CORONAVIRUS 2 AG -  ED   ____________________________________________  EKG   EKG Interpretation  Date/Time:  Saturday September 24 2019 15:41:19 EDT Ventricular Rate:  93 PR Interval:  166 QRS Duration: 84 QT Interval:  348 QTC Calculation: 432 R Axis:   3 Text Interpretation: Normal sinus rhythm Minimal voltage criteria for LVH, may be normal variant ( R in aVL ) Possible Anterior infarct , age undetermined Abnormal ECG No STEMI Confirmed by Nanda Quinton [phone]) on 09/24/2019 3:52:15 PM       ____________________________________________  RADIOLOGY  DG Chest 2 View  Result Date: 09/24/2019 CLINICAL DATA:  Chest pain. EXAM: CHEST - 2 VIEW COMPARISON:  May 02, 2019 FINDINGS: Stable cardiomegaly. The hila and mediastinum are normal. No pneumothorax. No nodules or masses. No focal infiltrates. IMPRESSION: No active cardiopulmonary disease. Electronically Signed   By: Dorise Bullion III M.D   On: 09/24/2019 16:18    ____________________________________________   PROCEDURES  Procedure(s) performed:   Marland KitchenMarland KitchenIncision and Drainage  Date/Time: 09/24/2019 6:40 PM Performed by: Margette Fast, MD Authorized by: Margette Fast,  MD   Consent:    Consent obtained:  Verbal   Consent given by:  Patient   Risks discussed:  Bleeding, damage to other organs, infection, incomplete drainage and pain   Alternatives discussed:  No treatment Location:    Type:  Abscess   Size:  1   Location:  Head   Head location:  Scalp Pre-procedure details:    Skin preparation:  Chloraprep Anesthesia (see MAR for exact dosages):    Anesthesia method:  Local infiltration   Local anesthetic:  Lidocaine 1% w/o epi Procedure type:    Complexity:  Simple Procedure details:    Needle aspiration: no     Incision types:  Stab incision   Incision depth:  Dermal   Scalpel blade:  11   Drainage:  Purulent   Drainage amount:  Scant   Wound treatment:  Wound left open   Packing materials:  None Post-procedure details:    Patient  tolerance of procedure:  Tolerated well, no immediate complications     ____________________________________________   INITIAL IMPRESSION / ASSESSMENT AND PLAN / ED COURSE  Pertinent labs & imaging results that were available during my care of the patient were reviewed by me and considered in my medical decision making (see chart for details).   Patient presents emergency department evaluation of chest pain and lightheadedness which presents only with standing and then resolves shortly afterwards.  She does have some baseline shortness of breath and mentions in review of systems some anosmia which may be leading to lack of appetite.  Her vital signs here are largely unremarkable.  Blood pressure reading is pending and not noted in triage.  Patient is overall well-appearing and in no distress.  Her chest x-ray is clear and initial CBC is largely unremarkable with only mild anemia.  I do plan to test for COVID-19 given the anosmia and other associated symptoms.  I have added hepatic function and lipase with patient's occasional abdominal discomfort but no focal tenderness on my exam.  Plan for IV fluids,  orthostatic vital signs, incision and drainage of the scalp abscess which was discussed with the patient who provided verbal consent for this procedure.  Labs and imaging reviewed. Repeat troponin unchanged. CP is very atypical in that it only occurs with standing. None at rest. Normal orthostatics. Plan for outpatient Cardiology follow up given risk factors and troponin level here. Referral placed. Discussed strict ED return precautions. Patient tolerated I&D well here covering with Doxy. Provided refills home home meds in the ED but discussed need for close PCP follow up for additional refills.  ____________________________________________  FINAL CLINICAL IMPRESSION(S) / ED DIAGNOSES  Final diagnoses:  Precordial chest pain  Scalp abscess     MEDICATIONS GIVEN DURING THIS VISIT:  Medications  sodium chloride 0.9 % bolus 500 mL (0 mLs Intravenous Stopped 09/24/19 1800)  lidocaine (PF) (XYLOCAINE) 1 % injection 5 mL (5 mLs Infiltration Given 09/24/19 1842)     NEW OUTPATIENT MEDICATIONS STARTED DURING THIS VISIT:  Discharge Medication List as of 09/24/2019  8:11 PM    START taking these medications   Details  doxycycline (VIBRAMYCIN) 100 MG capsule Take 1 capsule (100 mg total) by mouth 2 (two) times daily for 7 days., Starting Sat 09/24/2019, Until Sat 10/01/2019, Normal        Note:  This document was prepared using Dragon voice recognition software and may include unintentional dictation errors.  Nanda Quinton, MD, [facility]    Lanay Zinda, [facility], MD 09/25/19 1114

## 2020-01-10 ENCOUNTER — Encounter (HOSPITAL_COMMUNITY): Payer: Self-pay | Admitting: Emergency Medicine

## 2020-01-10 ENCOUNTER — Emergency Department (HOSPITAL_COMMUNITY): Payer: Self-pay

## 2020-01-10 ENCOUNTER — Emergency Department (HOSPITAL_COMMUNITY)
Admission: EM | Admit: 2020-01-10 | Discharge: 2020-01-11 | Disposition: A | Discharge status: Home / Self Care | Payer: Self-pay | Attending: Emergency Medicine | Admitting: Emergency Medicine

## 2020-01-10 DIAGNOSIS — J189 Pneumonia, unspecified organism: Secondary | ICD-10-CM

## 2020-01-10 DIAGNOSIS — R109 Unspecified abdominal pain: Secondary | ICD-10-CM | POA: Insufficient documentation

## 2020-01-10 DIAGNOSIS — I10 Essential (primary) hypertension: Secondary | ICD-10-CM | POA: Insufficient documentation

## 2020-01-10 DIAGNOSIS — R112 Nausea with vomiting, unspecified: Secondary | ICD-10-CM | POA: Insufficient documentation

## 2020-01-10 DIAGNOSIS — Z79899 Other long term (current) drug therapy: Secondary | ICD-10-CM | POA: Insufficient documentation

## 2020-01-10 DIAGNOSIS — R519 Headache, unspecified: Secondary | ICD-10-CM | POA: Insufficient documentation

## 2020-01-10 DIAGNOSIS — J182 Hypostatic pneumonia, unspecified organism: Secondary | ICD-10-CM | POA: Insufficient documentation

## 2020-01-10 DIAGNOSIS — G8929 Other chronic pain: Secondary | ICD-10-CM | POA: Insufficient documentation

## 2020-01-10 DIAGNOSIS — E119 Type 2 diabetes mellitus without complications: Secondary | ICD-10-CM | POA: Insufficient documentation

## 2020-01-10 DIAGNOSIS — Z20822 Contact with and (suspected) exposure to covid-19: Secondary | ICD-10-CM | POA: Insufficient documentation

## 2020-01-10 LAB — COMPREHENSIVE METABOLIC PANEL
ALT: 11 U/L (ref 0–44)
AST: 14 U/L — ABNORMAL LOW (ref 15–41)
Albumin: 4.1 g/dL (ref 3.5–5.0)
Alkaline Phosphatase: 90 U/L (ref 38–126)
Anion gap: 9 (ref 5–15)
BUN: 9 mg/dL (ref 6–20)
CO2: 25 mmol/L (ref 22–32)
Calcium: 9.7 mg/dL (ref 8.9–10.3)
Chloride: 105 mmol/L (ref 98–111)
Creatinine, Ser: 0.72 mg/dL (ref 0.44–1.00)
GFR calc Af Amer: >60 mL/min (ref >60)
GFR calc non Af Amer: >60 mL/min (ref >60)
Glucose, Bld: 151 mg/dL — ABNORMAL HIGH (ref 70–99)
Potassium: 3.8 mmol/L (ref 3.5–5.1)
Sodium: 139 mmol/L (ref 135–145)
Total Bilirubin: 0.7 mg/dL (ref 0.3–1.2)
Total Protein: 8.6 g/dL — ABNORMAL HIGH (ref 6.5–8.1)

## 2020-01-10 LAB — CBC
HCT: 36.6 % (ref 36.0–46.0)
Hemoglobin: 10.8 g/dL — ABNORMAL LOW (ref 12.0–15.0)
MCH: 22.1 pg — ABNORMAL LOW (ref 26.0–34.0)
MCHC: 29.5 g/dL — ABNORMAL LOW (ref 30.0–36.0)
MCV: 75 fL — ABNORMAL LOW (ref 80.0–100.0)
Platelets: 374 10*3/uL (ref 150–400)
RBC: 4.88 MIL/uL (ref 3.87–5.11)
RDW: 18.8 % — ABNORMAL HIGH (ref 11.5–15.5)
WBC: 5.6 10*3/uL (ref 4.0–10.5)
nRBC: 0 % (ref 0.0–0.2)

## 2020-01-10 LAB — LIPASE, BLOOD: Lipase: 32 U/L (ref 11–51)

## 2020-01-10 LAB — I-STAT BETA HCG BLOOD, ED (MC, WL, AP ONLY): I-stat hCG, quantitative: <5 m[IU]/mL (ref <5)

## 2020-01-10 MED ORDER — LABETALOL HCL 5 MG/ML IV SOLN
10.0000 mg | Freq: Once | INTRAVENOUS | Status: AC
  Administered 2020-01-10: 10 mg via INTRAVENOUS
  Filled 2020-01-10: qty 4

## 2020-01-10 MED ORDER — PROCHLORPERAZINE EDISYLATE 10 MG/2ML IJ SOLN
10.0000 mg | Freq: Once | INTRAMUSCULAR | Status: AC
  Administered 2020-01-10: 10 mg via INTRAVENOUS
  Filled 2020-01-10: qty 2

## 2020-01-10 MED ORDER — SODIUM CHLORIDE 0.9 % IV BOLUS
1000.0000 mL | Freq: Once | INTRAVENOUS | Status: AC
  Administered 2020-01-10: 1000 mL via INTRAVENOUS

## 2020-01-10 MED ORDER — DIPHENHYDRAMINE HCL 50 MG/ML IJ SOLN
25.0000 mg | Freq: Once | INTRAMUSCULAR | Status: AC
  Administered 2020-01-10: 25 mg via INTRAVENOUS
  Filled 2020-01-10: qty 1

## 2020-01-10 NOTE — ED Triage Notes (Signed)
Per patient states abdominal pain, vomiting since yesterday

## 2020-01-10 NOTE — ED Provider Notes (Signed)
[facility] DEPT Provider Note   CSN: [id number] Arrival date & time: 01/10/20  1528     History Chief Complaint  Patient presents with  . Abdominal Pain    Falen BRIDGETT HATTABAUGH is a 49 y.o. female.  HPI      Headache, nausea and vomiting, started last night and worsened today Headache 9/10, vomited likely 4-5 times today Denies numbness, weakness, difficulty talking or walking, visual changes or facial droop with exception of sensation of "numbness to the head", difficulty keeping eyes open/fatigue Abdominal pain began today, hurts in the middle, started while throwing up, sharp pain  No diarrhea or constipation, no urinary symptoms No fevers On ROS did report some dyspnea  Took BP medication yesterday not today    Past Medical History:  Diagnosis Date  . Diabetes mellitus without complication ([place])   . Hypertension   . Obesity     There are no problems to display for this patient.   Past Surgical History:  Procedure Laterality Date  . FEMUR FRACTURE SURGERY     r/hip  . JOINT REPLACEMENT       OB History   No obstetric history on file.     Family History  Problem Relation Age of Onset  . Diabetes Mother   . Diabetes Father     Social History   Tobacco Use  . Smoking status: Never Smoker  . Smokeless tobacco: Never Used  Substance Use Topics  . Alcohol use: No  . Drug use: No    Home Medications Prior to Admission medications   Medication Sig Start Date End Date Taking? Authorizing Provider  lisinopril (ZESTRIL) 10 MG tablet Take 1 tablet (10 mg total) by mouth daily. 09/24/19 10/24/19  Long, Wonda Olds, MD  metFORMIN (GLUCOPHAGE) 500 MG tablet Take 1 tablet (500 mg total) by mouth 2 (two) times daily. 09/24/19 10/24/19  LongWonda Olds, MD  Multiple Vitamin (MULTIVITAMIN WITH MINERALS) TABS tablet Take 1 tablet by mouth daily.    [provider]    Allergies    Vicodin [hydrocodone-acetaminophen] and  Penicillins  Review of Systems   Review of Systems  Constitutional: Positive for appetite change and fatigue. Negative for fever (99.6).  HENT: Positive for congestion and sore throat. Negative for rhinorrhea.   Eyes: Negative for visual disturbance.  Respiratory: Positive for cough (yesterday) and shortness of breath.   Cardiovascular: Negative for chest pain.  Gastrointestinal: Positive for abdominal pain, nausea and vomiting. Negative for constipation and diarrhea.  Genitourinary: Negative for difficulty urinating and dysuria.  Musculoskeletal: Negative for back pain and neck pain.  Skin: Negative for rash.  Neurological: Positive for headaches. Negative for syncope.    Physical Exam Updated Vital Signs BP (!) 171/103   Pulse 96   Temp 98.9 F (37.2 C) (Oral)   Resp 18   Ht 6\' 2"  (1.88 m)   Wt (!) 145.2 kg   LMP  (LMP Unknown)   SpO2 100%   BMI 41.09 kg/m   Physical Exam Vitals and nursing note reviewed.  Constitutional:      General: She is not in acute distress.    Appearance: She is well-developed. She is not diaphoretic.  HENT:     Head: Normocephalic and atraumatic.  Eyes:     Conjunctiva/sclera: Conjunctivae normal.  Cardiovascular:     Rate and Rhythm: Normal rate and regular rhythm.     Heart sounds: Normal heart sounds. No murmur heard.  No friction rub. No  gallop.   Pulmonary:     Effort: Pulmonary effort is normal. No respiratory distress.     Breath sounds: Normal breath sounds. No wheezing or rales.  Abdominal:     General: There is no distension.     Palpations: Abdomen is soft.     Tenderness: There is no abdominal tenderness. There is no guarding.  Musculoskeletal:        General: No tenderness.     Cervical back: Normal range of motion.  Skin:    General: Skin is warm and dry.     Findings: No erythema or rash.  Neurological:     Mental Status: She is alert and oriented to person, place, and time.     ED Results / Procedures /  Treatments   Labs (all labs ordered are listed, but only abnormal results are displayed) Labs Reviewed  COMPREHENSIVE METABOLIC PANEL - Abnormal; Notable for the following components:      Result Value   Glucose, Bld 151 (*)    Total Protein 8.6 (*)    AST 14 (*)    All other components within normal limits  CBC - Abnormal; Notable for the following components:   Hemoglobin 10.8 (*)    MCV 75.0 (*)    MCH 22.1 (*)    MCHC 29.5 (*)    RDW 18.8 (*)    All other components within normal limits  SARS CORONAVIRUS 2 BY RT PCR (HOSPITAL ORDER, [facility])  LIPASE, BLOOD  ETHANOL  URINALYSIS, ROUTINE W REFLEX MICROSCOPIC  RAPID URINE DRUG SCREEN, HOSP PERFORMED  I-STAT BETA HCG BLOOD, ED ([facility], [facility], [facility] ONLY)    EKG EKG Interpretation  Date/Time:  Tuesday January 10 2020 21:20:38 EDT Ventricular Rate:  91 PR Interval:    QRS Duration: 86 QT Interval:  365 QTC Calculation: 450 R Axis:   60 Text Interpretation: Sinus rhythm Borderline prolonged PR interval Anteroseptal infarct, old No significant change since last tracing Confirmed by Gareth Morgan [phone]) on 01/10/2020 11:35:58 PM   Radiology CT Head Wo Contrast  Result Date: 01/10/2020 CLINICAL DATA:  Headache EXAM: CT HEAD WITHOUT CONTRAST TECHNIQUE: Contiguous axial images were obtained from the base of the skull through the vertex without intravenous contrast. COMPARISON:  None. FINDINGS: Brain: There is no acute intracranial hemorrhage, mass effect, or edema. Gray-white differentiation is preserved. There is no extra-axial fluid collection. Ventricles and sulci are within normal limits in size and configuration. Vascular: No hyperdense vessel or unexpected calcification. Skull: Calvarium is unremarkable. Sinuses/Orbits: No acute finding. Other: None. IMPRESSION: No acute intracranial abnormality. Electronically Signed   By: Macy Mis M.D.   On: 01/10/2020 22:00   DG Chest Portable 1 View  Result  Date: 01/10/2020 CLINICAL DATA:  Shortness of breath EXAM: PORTABLE CHEST 1 VIEW COMPARISON:  09/24/2019 FINDINGS: Borderline cardiomegaly. Central vascular congestion. Hazy opacity at the lung bases. No pneumothorax. IMPRESSION: 1. Borderline cardiomegaly with central vascular congestion. 2. Hazy opacity at the lung bases may reflect atelectasis or pneumonia. Electronically Signed   By: Donavan Foil M.D.   On: 01/10/2020 21:21    Procedures Procedures (including critical care time)  Medications Ordered in ED Medications  labetalol (NORMODYNE) injection 10 mg (10 mg Intravenous Given 01/10/20 2107)  prochlorperazine (COMPAZINE) injection 10 mg (10 mg Intravenous Given 01/10/20 2105)  diphenhydrAMINE (BENADRYL) injection 25 mg (25 mg Intravenous Given 01/10/20 2106)  sodium chloride 0.9 % bolus 1,000 mL (1,000 mLs Intravenous New Bag/Given 01/10/20 2105)  ED Course  I have reviewed the triage vital signs and the nursing notes.  Pertinent labs & imaging results that were available during my care of the patient were reviewed by me and considered in my medical decision making (see chart for details).    MDM Rules/Calculators/A&P                          49 year old female with a history of hypertension and diabetes presents with concern for headache beginning yesterday, nausea, vomiting and abdominal pain.  Headache began slowly and increased.  CT head completed shows no evidence of intracranial hemorrhage.  No focal neurologic symptoms or signs on exam to suggest CVA.   Abdominal abdominal pain began after the headache, nausea, and vomiting.  History and exam is not consistent with small bowel obstruction, cholecystitis, appendicitis, pancreatitis, diverticulitis, AAA. No sign of DKA.  Suspect possible gastritis related to emesis. Possible pyelonephritis or viral etiology.   Reports on ROS some cough, XR shows possible atelectasis vs pneumonia.  BP on arrival elevated to 200s.  With  negative CT head, have low suspicion for other hypertensive emergency.  BP improved with labetalol.   She has a sleepy affect on arrival, unclear if due to illness, substance, or time of evaluation--however is oriented x4 with nonfocal neurologic exam.  She reports her symptoms have improved after compazine/benadryl.   UA and UDS pending at time of transfer of care.    Final Clinical Impression(s) / ED Diagnoses Final diagnoses:  Nonintractable headache, unspecified chronicity pattern, unspecified headache type  Nausea and vomiting, intractability of vomiting not specified, unspecified vomiting type  Abdominal pain, unspecified abdominal location  Community acquired pneumonia, unspecified laterality    Rx / DC Orders ED Discharge Orders    None       Gareth Morgan, MD 01/11/20 0139

## 2020-01-10 NOTE — ED Notes (Signed)
I walked into the pt's room and the pt was squatting over the trash can, urinating. I asked the pt why she did not call to inform us that she needed to use the restroom. She stated that it was an emergency and she had to go right then. I asked if she were able to provide a urine sample. She stated that she could not provide a sample as she had finished urinating. Pt placed on purewick to prevent repeat incidents.

## 2020-01-11 ENCOUNTER — Other Ambulatory Visit: Payer: Self-pay

## 2020-01-11 LAB — URINALYSIS, ROUTINE W REFLEX MICROSCOPIC
Bacteria, UA: NONE SEEN
Bilirubin Urine: NEGATIVE
Glucose, UA: NEGATIVE mg/dL
Leukocytes,Ua: NEGATIVE
Nitrite: NEGATIVE
Specific Gravity, Urine: 1.03 (ref 1.005–1.030)
pH: 5 (ref 5.0–8.0)

## 2020-01-11 LAB — RAPID URINE DRUG SCREEN, HOSP PERFORMED
Amphetamines: NOT DETECTED
Barbiturates: NOT DETECTED
Benzodiazepines: NOT DETECTED
Cocaine: NOT DETECTED
Opiates: NOT DETECTED
Tetrahydrocannabinol: NOT DETECTED

## 2020-01-11 LAB — SARS CORONAVIRUS 2 BY RT PCR (HOSPITAL ORDER, PERFORMED IN ~~LOC~~ HOSPITAL LAB): SARS Coronavirus 2: NEGATIVE

## 2020-01-11 LAB — ETHANOL: Alcohol, Ethyl (B): <10 mg/dL (ref <10)

## 2020-01-11 MED ORDER — SODIUM CHLORIDE 0.9 % IV BOLUS
1000.0000 mL | Freq: Once | INTRAVENOUS | Status: AC
  Administered 2020-01-11: 1000 mL via INTRAVENOUS

## 2020-01-11 MED ORDER — ONDANSETRON 8 MG PO TBDP
8.0000 mg | ORAL_TABLET | Freq: Three times a day (TID) | ORAL | 0 refills | Status: DC | PRN
Start: 2020-01-11 — End: 2020-08-07

## 2020-01-11 NOTE — ED Provider Notes (Signed)
Nursing notes and vitals signs, including pulse oximetry, reviewed.  Summary of this visit's results, reviewed by myself:  EKG:  EKG Interpretation  Date/Time:  Tuesday January 10 2020 21:20:38 EDT Ventricular Rate:  91 PR Interval:    QRS Duration: 86 QT Interval:  365 QTC Calculation: 450 R Axis:   60 Text Interpretation: Sinus rhythm Borderline prolonged PR interval Anteroseptal infarct, old No significant change since last tracing Confirmed by Gareth Morgan [phone]) on 01/10/2020 11:35:58 PM       Labs:  Results for orders placed or performed during the hospital encounter of 01/10/20 (from the past 24 hour(s))  Lipase, blood     Status: None   Collection Time: 01/10/20  5:05 PM  Result Value Ref Range   Lipase 32 11 - 51 U/L  Comprehensive metabolic panel     Status: Abnormal   Collection Time: 01/10/20  5:05 PM  Result Value Ref Range   Sodium 139 135 - 145 mmol/L   Potassium 3.8 3.5 - 5.1 mmol/L   Chloride 105 98 - 111 mmol/L   CO2 25 22 - 32 mmol/L   Glucose, Bld 151 (H) 70 - 99 mg/dL   BUN 9 6 - 20 mg/dL   Creatinine, Ser 0.72 0.44 - 1.00 mg/dL   Calcium 9.7 8.9 - 10.3 mg/dL   Total Protein 8.6 (H) 6.5 - 8.1 g/dL   Albumin 4.1 3.5 - 5.0 g/dL   AST 14 (L) 15 - 41 U/L   ALT 11 0 - 44 U/L   Alkaline Phosphatase 90 38 - 126 U/L   Total Bilirubin 0.7 0.3 - 1.2 mg/dL   GFR calc non Af Amer >60 >60 mL/min   GFR calc Af Amer >60 >60 mL/min   Anion gap 9 5 - 15  CBC     Status: Abnormal   Collection Time: 01/10/20  5:05 PM  Result Value Ref Range   WBC 5.6 4.0 - 10.5 K/uL   RBC 4.88 3.87 - 5.11 MIL/uL   Hemoglobin 10.8 (L) 12.0 - 15.0 g/dL   HCT 36.6 36 - 46 %   MCV 75.0 (L) 80.0 - 100.0 fL   MCH 22.1 (L) 26.0 - 34.0 pg   MCHC 29.5 (L) 30.0 - 36.0 g/dL   RDW 18.8 (H) 11.5 - 15.5 %   Platelets 374 150 - 400 K/uL   nRBC 0.0 0.0 - 0.2 %  I-Stat beta hCG blood, ED     Status: None   Collection Time: 01/10/20  5:12 PM  Result Value Ref Range   I-stat hCG,  quantitative <5.0 <5 mIU/mL   Comment 3          SARS Coronavirus 2 by RT PCR (hospital order, performed in [facility] hospital lab) Nasopharyngeal Nasopharyngeal Swab     Status: None   Collection Time: 01/10/20 11:30 PM   Specimen: Nasopharyngeal Swab  Result Value Ref Range   SARS Coronavirus 2 NEGATIVE NEGATIVE  Ethanol     Status: None   Collection Time: 01/10/20 11:34 PM  Result Value Ref Range   Alcohol, Ethyl (B) <10 <10 mg/dL  Urinalysis, Routine w reflex microscopic     Status: Abnormal   Collection Time: 01/11/20  2:00 AM  Result Value Ref Range   Color, Urine YELLOW YELLOW   APPearance CLEAR CLEAR   Specific Gravity, Urine 1.030 1.005 - 1.030   pH 5.0 5.0 - 8.0   Glucose, UA NEGATIVE NEGATIVE mg/dL   Hgb urine dipstick TRACE (  A) NEGATIVE   Bilirubin Urine NEGATIVE NEGATIVE   Ketones, ur TRACE (A) NEGATIVE mg/dL   Protein, ur TRACE (A) NEGATIVE mg/dL   Nitrite NEGATIVE NEGATIVE   Leukocytes,Ua NEGATIVE NEGATIVE   RBC / HPF 0-5 0 - 5 RBC/hpf   WBC, UA 0-5 0 - 5 WBC/hpf   Bacteria, UA NONE SEEN NONE SEEN   Squamous Epithelial / LPF 0-5 0 - 5   Mucus PRESENT   Rapid urine drug screen (hospital performed)     Status: None   Collection Time: 01/11/20  2:00 AM  Result Value Ref Range   Opiates NONE DETECTED NONE DETECTED   Cocaine NONE DETECTED NONE DETECTED   Benzodiazepines NONE DETECTED NONE DETECTED   Amphetamines NONE DETECTED NONE DETECTED   Tetrahydrocannabinol NONE DETECTED NONE DETECTED   Barbiturates NONE DETECTED NONE DETECTED    Imaging Studies: CT Head Wo Contrast  Result Date: 01/10/2020 CLINICAL DATA:  Headache EXAM: CT HEAD WITHOUT CONTRAST TECHNIQUE: Contiguous axial images were obtained from the base of the skull through the vertex without intravenous contrast. COMPARISON:  None. FINDINGS: Brain: There is no acute intracranial hemorrhage, mass effect, or edema. Gray-white differentiation is preserved. There is no extra-axial fluid collection.  Ventricles and sulci are within normal limits in size and configuration. Vascular: No hyperdense vessel or unexpected calcification. Skull: Calvarium is unremarkable. Sinuses/Orbits: No acute finding. Other: None. IMPRESSION: No acute intracranial abnormality. Electronically Signed   By: Macy Mis M.D.   On: 01/10/2020 22:00   DG Chest Portable 1 View  Result Date: 01/10/2020 CLINICAL DATA:  Shortness of breath EXAM: PORTABLE CHEST 1 VIEW COMPARISON:  09/24/2019 FINDINGS: Borderline cardiomegaly. Central vascular congestion. Hazy opacity at the lung bases. No pneumothorax. IMPRESSION: 1. Borderline cardiomegaly with central vascular congestion. 2. Hazy opacity at the lung bases may reflect atelectasis or pneumonia. Electronically Signed   By: Donavan Foil M.D.   On: 01/10/2020 21:21   2:33 AM Diagnostic studies reassuring.  We will give patient an additional liter of fluid and discharged on Zofran.   Keiondre Colee, Jenny Reichmann, MD 01/11/20 [phone]

## 2020-07-29 ENCOUNTER — Emergency Department (HOSPITAL_COMMUNITY): Payer: Self-pay

## 2020-07-29 ENCOUNTER — Inpatient Hospital Stay (HOSPITAL_COMMUNITY)
Admission: EM | Admit: 2020-07-29 | Discharge: 2020-08-02 | DRG: 271 | Disposition: A | Discharge status: Home / Self Care | Payer: Self-pay | Attending: Internal Medicine | Admitting: Internal Medicine

## 2020-07-29 ENCOUNTER — Other Ambulatory Visit: Payer: Self-pay

## 2020-07-29 ENCOUNTER — Encounter (HOSPITAL_COMMUNITY): Payer: Self-pay

## 2020-07-29 ENCOUNTER — Emergency Department (HOSPITAL_BASED_OUTPATIENT_CLINIC_OR_DEPARTMENT_OTHER): Payer: Self-pay

## 2020-07-29 DIAGNOSIS — R519 Headache, unspecified: Secondary | ICD-10-CM | POA: Diagnosis not present

## 2020-07-29 DIAGNOSIS — I82432 Acute embolism and thrombosis of left popliteal vein: Secondary | ICD-10-CM | POA: Diagnosis present

## 2020-07-29 DIAGNOSIS — I82512 Chronic embolism and thrombosis of left femoral vein: Secondary | ICD-10-CM | POA: Diagnosis present

## 2020-07-29 DIAGNOSIS — E041 Nontoxic single thyroid nodule: Secondary | ICD-10-CM | POA: Diagnosis present

## 2020-07-29 DIAGNOSIS — R300 Dysuria: Secondary | ICD-10-CM | POA: Diagnosis not present

## 2020-07-29 DIAGNOSIS — M7989 Other specified soft tissue disorders: Secondary | ICD-10-CM

## 2020-07-29 DIAGNOSIS — I82422 Acute embolism and thrombosis of left iliac vein: Secondary | ICD-10-CM | POA: Diagnosis present

## 2020-07-29 DIAGNOSIS — I82532 Chronic embolism and thrombosis of left popliteal vein: Secondary | ICD-10-CM | POA: Diagnosis present

## 2020-07-29 DIAGNOSIS — Z880 Allergy status to penicillin: Secondary | ICD-10-CM

## 2020-07-29 DIAGNOSIS — K5900 Constipation, unspecified: Secondary | ICD-10-CM | POA: Diagnosis not present

## 2020-07-29 DIAGNOSIS — N920 Excessive and frequent menstruation with regular cycle: Secondary | ICD-10-CM | POA: Diagnosis present

## 2020-07-29 DIAGNOSIS — E119 Type 2 diabetes mellitus without complications: Secondary | ICD-10-CM | POA: Diagnosis present

## 2020-07-29 DIAGNOSIS — I82522 Chronic embolism and thrombosis of left iliac vein: Secondary | ICD-10-CM | POA: Diagnosis present

## 2020-07-29 DIAGNOSIS — Z833 Family history of diabetes mellitus: Secondary | ICD-10-CM

## 2020-07-29 DIAGNOSIS — I82492 Acute embolism and thrombosis of other specified deep vein of left lower extremity: Secondary | ICD-10-CM

## 2020-07-29 DIAGNOSIS — D252 Subserosal leiomyoma of uterus: Secondary | ICD-10-CM | POA: Diagnosis present

## 2020-07-29 DIAGNOSIS — Z6841 Body Mass Index (BMI) 40.0 and over, adult: Secondary | ICD-10-CM

## 2020-07-29 DIAGNOSIS — Z885 Allergy status to narcotic agent status: Secondary | ICD-10-CM

## 2020-07-29 DIAGNOSIS — D509 Iron deficiency anemia, unspecified: Secondary | ICD-10-CM | POA: Diagnosis present

## 2020-07-29 DIAGNOSIS — Z7984 Long term (current) use of oral hypoglycemic drugs: Secondary | ICD-10-CM

## 2020-07-29 DIAGNOSIS — I82409 Acute embolism and thrombosis of unspecified deep veins of unspecified lower extremity: Secondary | ICD-10-CM | POA: Diagnosis present

## 2020-07-29 DIAGNOSIS — S0101XA Laceration without foreign body of scalp, initial encounter: Secondary | ICD-10-CM

## 2020-07-29 DIAGNOSIS — I10 Essential (primary) hypertension: Secondary | ICD-10-CM | POA: Diagnosis present

## 2020-07-29 DIAGNOSIS — W19XXXA Unspecified fall, initial encounter: Secondary | ICD-10-CM

## 2020-07-29 DIAGNOSIS — I871 Compression of vein: Secondary | ICD-10-CM | POA: Diagnosis present

## 2020-07-29 DIAGNOSIS — Z20822 Contact with and (suspected) exposure to covid-19: Secondary | ICD-10-CM | POA: Diagnosis present

## 2020-07-29 DIAGNOSIS — M79605 Pain in left leg: Secondary | ICD-10-CM

## 2020-07-29 DIAGNOSIS — I82442 Acute embolism and thrombosis of left tibial vein: Secondary | ICD-10-CM | POA: Diagnosis present

## 2020-07-29 DIAGNOSIS — I82412 Acute embolism and thrombosis of left femoral vein: Principal | ICD-10-CM | POA: Diagnosis present

## 2020-07-29 DIAGNOSIS — Z79899 Other long term (current) drug therapy: Secondary | ICD-10-CM

## 2020-07-29 LAB — BASIC METABOLIC PANEL
Anion gap: 11 (ref 5–15)
BUN: 7 mg/dL (ref 6–20)
CO2: 23 mmol/L (ref 22–32)
Calcium: 9.1 mg/dL (ref 8.9–10.3)
Chloride: 103 mmol/L (ref 98–111)
Creatinine, Ser: 0.79 mg/dL (ref 0.44–1.00)
GFR, Estimated: >60 mL/min (ref >60)
Glucose, Bld: 181 mg/dL — ABNORMAL HIGH (ref 70–99)
Potassium: 3.5 mmol/L (ref 3.5–5.1)
Sodium: 137 mmol/L (ref 135–145)

## 2020-07-29 LAB — CBC
HCT: 32.9 % — ABNORMAL LOW (ref 36.0–46.0)
Hemoglobin: 10.2 g/dL — ABNORMAL LOW (ref 12.0–15.0)
MCH: 23.4 pg — ABNORMAL LOW (ref 26.0–34.0)
MCHC: 31 g/dL (ref 30.0–36.0)
MCV: 75.5 fL — ABNORMAL LOW (ref 80.0–100.0)
Platelets: 229 10*3/uL (ref 150–400)
RBC: 4.36 MIL/uL (ref 3.87–5.11)
RDW: 17 % — ABNORMAL HIGH (ref 11.5–15.5)
WBC: 4.7 10*3/uL (ref 4.0–10.5)
nRBC: 0 % (ref 0.0–0.2)

## 2020-07-29 LAB — I-STAT BETA HCG BLOOD, ED (MC, WL, AP ONLY): I-stat hCG, quantitative: <5 m[IU]/mL (ref <5)

## 2020-07-29 LAB — HEPARIN LEVEL (UNFRACTIONATED): Heparin Unfractionated: 0.29 IU/mL — ABNORMAL LOW (ref 0.30–0.70)

## 2020-07-29 LAB — RESP PANEL BY RT-PCR (FLU A&B, COVID) ARPGX2
Influenza A by PCR: NEGATIVE
Influenza B by PCR: NEGATIVE
SARS Coronavirus 2 by RT PCR: NEGATIVE

## 2020-07-29 LAB — BRAIN NATRIURETIC PEPTIDE: B Natriuretic Peptide: 25.1 pg/mL (ref 0.0–100.0)

## 2020-07-29 LAB — TROPONIN I (HIGH SENSITIVITY)
Troponin I (High Sensitivity): 5 ng/L (ref <18)
Troponin I (High Sensitivity): 6 ng/L (ref <18)

## 2020-07-29 MED ORDER — LISINOPRIL 10 MG PO TABS
10.0000 mg | ORAL_TABLET | Freq: Every day | ORAL | Status: DC
  Administered 2020-07-29 – 2020-08-02 (×5): 10 mg via ORAL
  Filled 2020-07-29 (×5): qty 1

## 2020-07-29 MED ORDER — HEPARIN BOLUS VIA INFUSION
6000.0000 [IU] | Freq: Once | INTRAVENOUS | Status: AC
  Administered 2020-07-29: 6000 [IU] via INTRAVENOUS
  Filled 2020-07-29: qty 6000

## 2020-07-29 MED ORDER — ACETAMINOPHEN 650 MG RE SUPP: 650.0000 mg | Freq: Four times a day (QID) | RECTAL | Status: DC | PRN

## 2020-07-29 MED ORDER — IOHEXOL 350 MG/ML SOLN
150.0000 mL | Freq: Once | INTRAVENOUS | Status: AC | PRN
  Administered 2020-07-29: 150 mL via INTRAVENOUS

## 2020-07-29 MED ORDER — POLYETHYLENE GLYCOL 3350 17 G PO PACK: 17.0000 g | PACK | Freq: Every day | ORAL | Status: DC | PRN

## 2020-07-29 MED ORDER — HEPARIN (PORCINE) 25000 UT/250ML-% IV SOLN
2100.0000 [IU]/h | INTRAVENOUS | Status: DC
  Administered 2020-07-29: 1800 [IU]/h via INTRAVENOUS
  Administered 2020-07-30 (×2): 2000 [IU]/h via INTRAVENOUS
  Administered 2020-07-31 – 2020-08-01 (×5): 2400 [IU]/h via INTRAVENOUS
  Filled 2020-07-29 (×9): qty 250

## 2020-07-29 MED ORDER — ACETAMINOPHEN 325 MG PO TABS
650.0000 mg | ORAL_TABLET | Freq: Four times a day (QID) | ORAL | Status: DC | PRN
  Administered 2020-07-30: 650 mg via ORAL
  Filled 2020-07-29: qty 2

## 2020-07-29 MED ORDER — OXYCODONE HCL 5 MG PO TABS
5.0000 mg | ORAL_TABLET | ORAL | Status: DC | PRN
  Administered 2020-08-01: 5 mg via ORAL
  Filled 2020-07-29: qty 1

## 2020-07-29 NOTE — Consult Note (Signed)
Vascular and Vein Specialist of [facility]  Patient name: Brooke Hughes MRN: [id number] DOB: 1971/04/18 Sex: female   REQUESTING PROVIDER:    ER   REASON FOR CONSULT:    Left leg DVT  HISTORY OF PRESENT ILLNESS:   Brooke Hughes is a 50 y.o. female, who presented to thte ED with a 3-4 day history of left leg swelling. She denies a history of trauma.  She does not have a clotting history.  She denies prolonged travel.  She suffers from diabetes, hypertension, and obesity.  She is a non-smoker  PAST MEDICAL HISTORY    Past Medical History:  Diagnosis Date  . Diabetes mellitus without complication ([place])   . Hypertension   . Obesity      FAMILY HISTORY   Family History  Problem Relation Age of Onset  . Diabetes Mother   . Diabetes Father     SOCIAL HISTORY:   Social History   Socioeconomic History  . Marital status: Married    Spouse name: Not on file  . Number of children: Not on file  . Years of education: Not on file  . Highest education level: Not on file  Occupational History  . Not on file  Tobacco Use  . Smoking status: Never Smoker  . Smokeless tobacco: Never Used  Substance and Sexual Activity  . Alcohol use: No  . Drug use: No  . Sexual activity: Not on file  Other Topics Concern  . Not on file  Social History Narrative  . Not on file   Social Determinants of Health   Financial Resource Strain: Not on file  Food Insecurity: Not on file  Transportation Needs: Not on file  Physical Activity: Not on file  Stress: Not on file  Social Connections: Not on file  Intimate Partner Violence: Not on file    ALLERGIES:    Allergies  Allergen Reactions  . Vicodin [Hydrocodone-Acetaminophen] Hives  . Penicillins Hives and Rash    CURRENT MEDICATIONS:    Current Facility-Administered Medications  Medication Dose Route Frequency Provider Last Rate Last Admin  . heparin ADULT infusion 100 units/mL  (25000 units/236mL)  1,800 Units/hr Intravenous Continuous Curatolo, Adam, DO 18 mL/hr at 07/29/20 1713 1,800 Units/hr at 07/29/20 1713   Current Outpatient Medications  Medication Sig Dispense Refill  . lisinopril (ZESTRIL) 10 MG tablet Take 1 tablet (10 mg total) by mouth daily. 30 tablet 0  . metFORMIN (GLUCOPHAGE) 500 MG tablet Take 1 tablet (500 mg total) by mouth 2 (two) times daily. 60 tablet 0  . Multiple Vitamin (MULTIVITAMIN WITH MINERALS) TABS tablet Take 1 tablet by mouth daily.    . ondansetron (ZOFRAN ODT) 8 MG disintegrating tablet Take 1 tablet (8 mg total) by mouth every 8 (eight) hours as needed for nausea or vomiting. (Patient not taking: Reported on 07/29/2020) 10 tablet 0    REVIEW OF SYSTEMS:   [X]  denotes positive finding, [ ]  denotes negative finding Cardiac  Comments:  Chest pain or chest pressure:    Shortness of breath upon exertion:    Short of breath when lying flat:    Irregular heart rhythm:        Vascular    Pain in calf, thigh, or hip brought on by ambulation:    Pain in feet at night that wakes you up from your sleep:     Blood clot in your veins:    Leg swelling:  x       Pulmonary  Oxygen at home:    Productive cough:     Wheezing:         Neurologic    Sudden weakness in arms or legs:     Sudden numbness in arms or legs:     Sudden onset of difficulty speaking or slurred speech:    Temporary loss of vision in one eye:     Problems with dizziness:         Gastrointestinal    Blood in stool:      Vomited blood:         Genitourinary    Burning when urinating:     Blood in urine:        Psychiatric    Major depression:         Hematologic    Bleeding problems:    Problems with blood clotting too easily:        Skin    Rashes or ulcers:        Constitutional    Fever or chills:     PHYSICAL EXAM:   Vitals:   07/29/20 1605 07/29/20 1607 07/29/20 1705 07/29/20 1730  BP: (!) 161/106  (!) 164/76 (!) 132/101  Pulse: 77  77  74  Resp: 14  12 16   Temp:  98.2 F (36.8 C)    TempSrc:  Oral    SpO2: 99%  99% 100%  Weight:      Height:        GENERAL: The patient is a well-nourished female, in no acute distress. The vital signs are documented above. CARDIAC: There is a regular rate and rhythm.  VASCULAR: palpable pedal pulses, extensive edema of the left leg PULMONARY: Nonlabored respirations ABDOMEN: Soft and non-tender with normal pitched bowel sounds.  MUSCULOSKELETAL: There are no major deformities or cyanosis. NEUROLOGIC: No focal weakness or paresthesias are detected. SKIN: There are no ulcers or rashes noted. PSYCHIATRIC: The patient has a normal affect.  STUDIES:   I have reviewed the following duplex: RIGHT:  - No evidence of common femoral vein obstruction.    LEFT:  - Findings consistent with acute deep vein thrombosis involving the left  common femoral vein, left femoral vein, left proximal profunda vein, left  popliteal vein, and left posterior tibial veins.   CTV: CTA CHEST  1. Suboptimal opacification of the smaller segmental pulmonary arteries. Allowing for this, no evidence of an acute pulmonary embolism. Questionable small chronic pulmonary embolism in the right interlobar artery versus artifact, artifact favored. 2. No acute findings. 3. Thyroid mildly enlarged with at least 1.4 cm hypoattenuating nodule. Thyroid incompletely visualized. Recommend thyroid ultrasound (ref: J Am Coll Radiol. 2015 Feb;12(2): 143-50).  CT ABDOMEN AND PELVIS; CT VENOGRAM EXTREMITIES  1. Acute deep venous thrombosis extends from the left external iliac vein through the common femoral vein into the femoral vein. Nonocclusive thrombus noted in the left popliteal vein. Calf veins not well visualized on either side. 2. No other evidence of acute deep venous thrombosis. 3. No other acute abnormalities. 4. Gallbladder wall calcifications without significant change from the previous CT from  2016.  ASSESSMENT and PLAN   Left leg DVT;  The patient has thrombus extending into her iliac veinous system on the left.  This appears to be a unprovoked process, and so she will need a hypercoaguable workup in the future.  I discussed treatment options including anticoagulation vs mechanical thrombectomy.  We decided to proceed with intervention.  I discussed the details of the procedure,  including the possibility of stenting.  SHe wants to proceed.  We will try to get this done as soon as possible, however due to scheduling issues it may be Wednesday.  She will need to remain on IV heparin until her proceedure   Leia Alf, MD, FACS [facility] [phone] Pager [phone]

## 2020-07-29 NOTE — ED Provider Notes (Signed)
  Physical Exam  BP (!) 162/88   Pulse 66   Temp 98.9 F (37.2 C) (Oral)   Resp 16   Ht 6\' 2"  (1.88 m)   Wt (!) 145.2 kg   SpO2 99%   BMI 41.10 kg/m   Physical Exam Vitals and nursing note reviewed.  Constitutional:      General: She is awake. She is not in acute distress.    Appearance: She is well-developed and well-nourished. She is not ill-appearing.  HENT:     Head: Normocephalic and atraumatic.     Right Ear: External ear normal.     Left Ear: External ear normal.     Nose: Nose normal.  Eyes:     General: No scleral icterus.       Right eye: No discharge.        Left eye: No discharge.     Conjunctiva/sclera: Conjunctivae normal.  Cardiovascular:     Rate and Rhythm: Normal rate and regular rhythm.     Heart sounds: No murmur heard.   Pulmonary:     Effort: Pulmonary effort is normal. No respiratory distress.  Abdominal:     General: Abdomen is flat. There is no distension.     Palpations: Abdomen is soft.     Tenderness: There is no abdominal tenderness. There is no guarding or rebound.  Musculoskeletal:        General: No deformity or edema.     Cervical back: Neck supple.  Skin:    General: Skin is warm and dry.     Findings: No rash.  Neurological:     General: No focal deficit present.     Mental Status: She is alert and oriented to person, place, and time.  Psychiatric:        Mood and Affect: Mood and affect and mood normal.        Behavior: Behavior normal. Behavior is cooperative.     ED Course/Procedures     Procedures  MDM  Received patient handoff from Margarita Mail PA-C at 1500. Please refer to her note for full HPI. Briefly, patient is a 58yoF with a history of HTN, DM, and obesity who presents to the ED for SOB, chest pain, DOE, and LLE edema. VS reassuring and HDS. Plan at time of sign out includes f/u CTA chest and CTV abdomen. Anticipate admission for anticoagulation. Vascular consulted regarding large blood clot to LLE and plan for  thrombectomy this week. Patient started on heparin gtt.  On assessment, patient resting comfortably in no acute distress. CTA chest negative for acute PE. CTV demonstrates acute DVT extending from L external iliac vein through common femoral vein into femoral vein as well as nonocclusive thrombus in L popliteal vein.  Discussed patient with medicine who will admit for further [facility]. Vascular will follow and planning for thrombectomy. Patient remained HDS with no acute events during ED course. Patient in stable condition at time of admission.         Christy Gentles, MD 07/30/20 Volney American, Rea, DO 07/30/20 2334

## 2020-07-29 NOTE — ED Notes (Signed)
Pt transported to vascular.  °

## 2020-07-29 NOTE — Progress Notes (Signed)
VASCULAR LAB    Left lower extremity venous duplex has been performed.  See CV proc for preliminary results.  Messaged results via secure chat to Margarita Mail, PA-C   [facility], Makella Buckingham, RVT 07/29/2020, 1:03 PM

## 2020-07-29 NOTE — ED Notes (Signed)
Pt has 2+ edema of entire left leg, 1+ left pedal pulse, cap refill less than 3 sec, sensation intact.

## 2020-07-29 NOTE — ED Notes (Signed)
Patient transported to CT 

## 2020-07-29 NOTE — Progress Notes (Signed)
[place] for heparin Indication: DVT  Allergies  Allergen Reactions  . Vicodin [Hydrocodone-Acetaminophen] Hives  . Penicillins Hives and Rash    Patient Measurements: Height: 6\' 2"  (188 cm) Weight: (!) 145.2 kg (320 lb 1.7 oz) IBW/kg (Calculated) : 77.7 Heparin Dosing Weight: 111.5 kg  Vital Signs: Temp: 99 F (37.2 C) (02/13 2121) Temp Source: Oral (02/13 2121) BP: 183/91 (02/13 2121) Pulse Rate: 72 (02/13 2121)  Labs: Recent Labs    07/29/20 1214 07/29/20 1734 07/29/20 2301  HGB 10.2*  --   --   HCT 32.9*  --   --   PLT 229  --   --   HEPARINUNFRC  --   --  0.29*  CREATININE 0.79  --   --   TROPONINIHS 5 6  --     Estimated Creatinine Clearance: 143.7 mL/min (by C-G formula based on SCr of 0.79 mg/dL).  Assessment: 50 y.o. female with LLE DVT for heparin  Goal of Therapy:  Heparin level 0.3-0.7 units/ml Monitor platelets by anticoagulation protocol: Yes   Plan:  Increase Heparin  2000 units/hr Follow-up am labs.  Phillis Knack, PharmD, BCPS

## 2020-07-29 NOTE — ED Provider Notes (Addendum)
[facility] EMERGENCY DEPARTMENT Provider Note   CSN: [id number] Arrival date & time: 07/29/20  1114     History Chief Complaint  Patient presents with  . Chest Pain    Brooke Hughes is a 50 y.o. female with a BMI of 41.1, history of hypertension and diabetes who presents the emergency department with chief complaint of shortness of breath.  She has noticed swelling and pressure in the left leg for the past few days.  She has some swelling in both legs but the left is much bigger.  She is never had this before.  She has had associated exertional dyspnea but also endorses orthopnea and chest pain.  Although nursing note states that she has had inspiratory pain she denies this to me and states that it feels more like pressure located retrosternally.  It does not radiate anywhere.  She denies any nausea, vomiting.  She is on lisinopril but has been unable to take it for the past week because she is currently homeless and could not afford her medications.  She denies a history of PE or DVT.  She does not take any blood thinners.  HPI     Past Medical History:  Diagnosis Date  . Diabetes mellitus without complication ([place])   . Hypertension   . Obesity     There are no problems to display for this patient.   Past Surgical History:  Procedure Laterality Date  . FEMUR FRACTURE SURGERY     r/hip  . JOINT REPLACEMENT       OB History   No obstetric history on file.     Family History  Problem Relation Age of Onset  . Diabetes Mother   . Diabetes Father     Social History   Tobacco Use  . Smoking status: Never Smoker  . Smokeless tobacco: Never Used  Substance Use Topics  . Alcohol use: No  . Drug use: No    Home Medications Prior to Admission medications   Medication Sig Start Date End Date Taking? Authorizing Provider  lisinopril (ZESTRIL) 10 MG tablet Take 1 tablet (10 mg total) by mouth daily. 09/24/19 10/24/19  Long, Wonda Olds, MD  metFORMIN  (GLUCOPHAGE) 500 MG tablet Take 1 tablet (500 mg total) by mouth 2 (two) times daily. 09/24/19 10/24/19  LongWonda Olds, MD  Multiple Vitamin (MULTIVITAMIN WITH MINERALS) TABS tablet Take 1 tablet by mouth daily.    [provider]  ondansetron (ZOFRAN ODT) 8 MG disintegrating tablet Take 1 tablet (8 mg total) by mouth every 8 (eight) hours as needed for nausea or vomiting. 01/11/20   Molpus, John, MD    Allergies    Vicodin [hydrocodone-acetaminophen] and Penicillins  Review of Systems   Review of Systems Ten systems reviewed and are negative for acute change, except as noted in the HPI.   Physical Exam Updated Vital Signs BP (!) 163/101   Pulse 62   Temp 98.9 F (37.2 C) (Oral)   Resp 16   Ht 6\' 2"  (1.88 m)   Wt (!) 145.2 kg   SpO2 100%   BMI 41.10 kg/m   Physical Exam Vitals and nursing note reviewed.  Constitutional:      General: She is not in acute distress.    Appearance: She is well-developed and well-nourished. She is not diaphoretic.  HENT:     Head: Normocephalic and atraumatic.  Eyes:     General: No scleral icterus.    Conjunctiva/sclera: Conjunctivae normal.  Cardiovascular:     Rate and Rhythm: Normal rate and regular rhythm.     Heart sounds: Normal heart sounds. No murmur heard. No friction rub. No gallop.   Pulmonary:     Effort: Pulmonary effort is normal. No respiratory distress.     Breath sounds: Normal breath sounds.  Abdominal:     General: Bowel sounds are normal. There is no distension.     Palpations: Abdomen is soft. There is no mass.     Tenderness: There is no abdominal tenderness. There is no guarding.  Musculoskeletal:     Cervical back: Normal range of motion.  Skin:    General: Skin is warm and dry.  Neurological:     Mental Status: She is alert and oriented to person, place, and time.  Psychiatric:        Behavior: Behavior normal.     ED Results / Procedures / Treatments   Labs (all labs ordered are listed, but only  abnormal results are displayed) Labs Reviewed  BASIC METABOLIC PANEL  CBC  I-STAT BETA HCG BLOOD, ED ([facility], [facility], [facility] ONLY)  TROPONIN I (HIGH SENSITIVITY)    EKG None  Radiology No results found.  Procedures .Critical Care Performed by: Margarita Mail, PA-C Authorized by: Margarita Mail, PA-C   Critical care provider statement:    Critical care time (minutes):  30   Critical care time was exclusive of:  Separately billable procedures and treating other patients   Critical care was necessary to treat or prevent imminent or life-threatening deterioration of the following conditions: Extensive DVT.   Critical care was time spent personally by me on the following activities:  Discussions with consultants, evaluation of patient's response to treatment, examination of patient, ordering and performing treatments and interventions, ordering and review of laboratory studies, ordering and review of radiographic studies, pulse oximetry, re-evaluation of patient's condition, obtaining history from patient or surrogate and review of old charts     Medications Ordered in ED Medications - No data to display  ED Course  I have reviewed the triage vital signs and the nursing notes.  Pertinent labs & imaging results that were available during my care of the patient were reviewed by me and considered in my medical decision making (see chart for details).    MDM Rules/Calculators/A&P                          50 year old female here with complaint of chest pain and leg pain.The emergent differential diagnosis of chest pain includes: Acute coronary syndrome, pericarditis, aortic dissection, pulmonary embolism, tension pneumothorax, pneumonia, and esophageal rupture. I ordered and reviewed labs which include a CBC which shows mild chronic anemia of insignificant value, BMP without significant abnormality.  BNP and troponin within normal limits, negative i-STAT hCG.  Given the extensive swelling of her  left leg I ordered a vascular ultrasound which showed an extensive left DVT extending past the femoral region into the iliac veins.  I consulted with Dr. Trula Slade who suspects he will perform thrombectomy likely will be able to do this until Tuesday or Wednesday of the coming week.  I consulted with radiology who recommends a CT venogram for evaluation of the left lower extremity clot and how far it extends.  She will also receive a CT angiogram of the chest to rule out pulmonary embolus.  I have given signout to Dr. Ronnald Nian and Dr. Lanny Hurst who will assume care of the patient for  admission. Final Clinical Impression(s) / ED Diagnoses Final diagnoses:  None    Rx / DC Orders ED Discharge Orders    None       Margarita Mail, PA-C 07/31/20 1452    Pattricia Boss, MD 08/04/20 0846    Margarita Mail, PA-C 08/24/20 1716    Pattricia Boss, MD 08/25/20 [phone]

## 2020-07-29 NOTE — ED Triage Notes (Signed)
Pt arrived via [facility] for c/o SOB w/exertion, 7/10 mid sternal chest pressure and chest pain w/inspirationx2-3 days. Pt denies N/V. Pt c/o 9/10 pressure in left leg Per EMS pt has pitting edema of legs. Per EMS pt is living in her car and hasn't taken her lisinopril in 1 wk, because she couldn't get it. Pt is A&Ox4. Pt hypertensive. NSR on monitor

## 2020-07-29 NOTE — ED Notes (Signed)
Pt refused COVID test. I was trying to swab pt's nose and she kept moving her head and wouldn't let me swab her nose.

## 2020-07-29 NOTE — H&P (Addendum)
Date: 07/29/2020               Patient Name:  Brooke Hughes MRN: [id number]  DOB: 1971/05/29 Age / Sex: 50 y.o., female   PCP: Patient, No Pcp Per         Medical Service: Internal Medicine Teaching Service         Attending Physician: Dr. Velna Ochs, MD    First Contact: Dr. Collene Gobble Pager: [phone]  Second Contact: Dr. Darrick Meigs Pager: [phone]       After Hours (After 5p/  First Contact Pager: [phone]  weekends / holidays): Second Contact Pager: [phone]   Chief Complaint: Left leg pain   History of Present Illness:   Brooke Hughes is a 50 y.o. morbidly obese lady w/ PMHx HTN, NIDDM Type II and two premature births, presenting with chief complaint of left leg pain and swelling. She states she first noticed pain and swelling of her left thigh 4 days ago which has progressively worsened since to the point that she was unable to walk today. She endorses substernal pressure and dyspnea, both only with exertion, which are new since yesterday. She states that prior to yesterday she had been mobile, up and walking around frequently. She has had two pregnancies resulting in premature births at 31 weeks and 7 months, although notes gestational HTN during pregnancies. Did have COVID-19 ~2020 prior to receiving her two vaccinations around that time. Has not had her booster shot although denies any fevers, chills, SOB outside DOE, cough, or sick contacts. Denies any trauma, falls, hx of tobacco use, use of estrogen or hormonal therapies, miscarriages, prolonged periods of immobility, or any other symptoms. Denies any hx of bleeding other than heavy menstrual periods with clotting (not currently on menses).   Home Medications: Current Meds  Medication Sig  . lisinopril (ZESTRIL) 10 MG tablet Take 1 tablet (10 mg total) by mouth daily.  . metFORMIN (GLUCOPHAGE) 500 MG tablet Take 1 tablet (500 mg total) by mouth 2 (two) times daily.  . Multiple Vitamin (MULTIVITAMIN WITH MINERALS) TABS  tablet Take 1 tablet by mouth daily.   Allergies: Allergies as of 07/29/2020 - Review Complete 07/29/2020  Allergen Reaction Noted  . Vicodin [hydrocodone-acetaminophen] Hives 01/29/2017  . Penicillins Hives and Rash 03/17/2012   Past Medical History:  Diagnosis Date  . Diabetes mellitus without complication ([place])   . Hypertension   . Obesity    Family History:  Family History  Problem Relation Age of Onset  . Diabetes Mother   . Diabetes Father   Brother - CHF Mother - CHF, died of MI at age 36.   Social History:  Lives at home with her two sons.  Is not currently employed.  Denies any history of tobacco use.  Does not drink alcohol or use any illicit drugs.   Review of Systems: A complete ROS was negative except as per HPI.   Physical Exam: Blood pressure (!) 156/82, pulse 75, temperature 98.2 F (36.8 C), temperature source Oral, resp. rate 16, height 6\' 2"  (1.88 m), weight (!) 145.2 kg, SpO2 99 %.  General: Patient is morbidly obese. Appears tired but otherwise well. No acute distress. Eyes: Sclera non-icteric. No conjunctival injection.  HENT: Neck is supple. No nasal discharge. Respiratory: Lungs are CTA, bilaterally. No wheezes, rales, or rhonchi. No tachypnea or increased work of breathing on room air. Cardiovascular: Regular rate and rhythm. No murmurs, rubs, or gallops. There is non-pitting edema of the LLE >  RLE, most significant around the thigh. Distal pulses 1+ in bilateral lower extremities.  Neurological: Alert and oriented x 3. Sensation intact in bilateral lower extremities.  Musculoskeletal: Patient is able to move bilateral lower extremities at all joints. Normal muscle tone. Abdominal: Soft and non-tender to palpation. Bowel sounds intact. No rebound or guarding. Skin: No lesions. No rashes.  Psych: Normal affect. Normal tone of voice.   EKG: personally reviewed my interpretation is NSR at 65bpm with LVH but no consecutive T wave inversions or ST  segment changes to suggest acute ischemia. No changes to suggest RV strain. Normal PR and QTc intervals.   CXR: personally reviewed my interpretation is no acute cardiopulmonary process.   CTA Chest:  1. Suboptimal opacification of the smaller segmental pulmonary arteries. Allowing for this, no evidence of an acute pulmonary embolism. Questionable small chronic pulmonary embolism in the right interlobar artery versus artifact, artifact favored. 2. No acute findings. 3. Thyroid mildly enlarged with at least 1.4 cm hypoattenuating nodule. Thyroid incompletely visualized. Recommend thyroid ultrasound (ref: J Am Coll Radiol. 2015 Feb;12(2): 143-50).  CT ABDOMEN AND PELVIS; CT VENOGRAM EXTREMITIES 1. Acute deep venous thrombosis extends from the left external iliac vein through the common femoral vein into the femoral vein. Nonocclusive thrombus noted in the left popliteal vein. Calf veins not well visualized on either side. 2. No other evidence of acute deep venous thrombosis. 3. No other acute abnormalities. 4. Gallbladder wall calcifications without significant change from the previous CT from 2016.  Electronically Signed   By: Lajean Manes M.D.   On: 07/29/2020 17:46  Assessment & Plan by Problem: Active Problems:   DVT (deep venous thrombosis) (HCC)  # Acute DVT of the LLE Patient has had 4 days of worsening LLE pain and swelling and 2 days of exertional CP and DOE. BNP 25.1, Troponins 5 > 6. EKG without signs of right heart strain or ischemia. Found to have acute DVT extending from the L external iliac to the femoral vein with non-occlusive thrombus in the L popliteal vein. CTA chest showed questionable small chronic PE in the R interlobar artery, although no other evidence of clotting. Patient has no inciting event and no personal or family history of clots; consistent with unprovoked DVT. Vascular surgery consulted who recommend mechanical thrombectomy (likely ~ Wednesday  08/01/20) with IV heparin until that time and future hypercoagulable workup.  - Vascular surgery on board; greatly appreciate their recommendations - Continue heparin infusion  - Oxycodone 5mg  q4hrs for moderate-severe pain - Tylenol 650mg  q6hrs for mild pain / fever - Supplemental O2 as needed for SpO2 >92%  - Monitor CBC's  # Thyromegaly with Nodule  Incidental thyromegaly with at least 1.4cm hypoattenuating nodule discovered on CTA chest. However, thyroid incompletely visualized. Patient has no known history of thyroid disease although TSH not in system.  - Check morning TSH - Consider thyroid ultrasound given possibility of cancer as inciting event for DVT  # NIDDM Type II  Patient takes Metformin 500mg  BID only at home. No Hgb A1c on file.  - Check Hgb A1c  - Check morning BMP  # HTN Blood pressures remain elevated here, although in setting of acute illness. - Continue home lisinopril 10mg  daily  # Microcytic Anemia, Chronic  Hgb stable at 10.2, MCV 75.5. Patient endorses history of heavy menses with clotting. 2.3cm subserosal fibroid incidentally discovered on CT imaging today. Denies any active bleeding.  - Check morning iron studies  - Monitor CBC's   Code  Status: Full Code Diet: Heart Healthy DVT PPx: On therapeutic heparin infusion IVF: None  Dispo: Admit patient to Inpatient with expected length of stay greater than 2 midnights.  Signed: Jeralyn Bennett, MD 07/29/2020, 8:05 PM  Pager: [phone] After 5pm on weekdays and 1pm on weekends: On Call pager: [phone]

## 2020-07-29 NOTE — Progress Notes (Signed)
ANTICOAGULATION CONSULT NOTE - Initial Consult  Pharmacy Consult for heparin Indication: DVT  Allergies  Allergen Reactions  . Vicodin [Hydrocodone-Acetaminophen] Hives  . Penicillins Hives and Rash    Patient Measurements: Height: 6\' 2"  (188 cm) Weight: (!) 145.2 kg (320 lb 1.7 oz) IBW/kg (Calculated) : 77.7 Heparin Dosing Weight: 111.5 kg  Vital Signs: Temp: 98.2 F (36.8 C) (02/13 1607) Temp Source: Oral (02/13 1607) BP: 162/88 (02/13 1400) Pulse Rate: 66 (02/13 1400)  Labs: Recent Labs    07/29/20 1214  HGB 10.2*  HCT 32.9*  PLT 229  CREATININE 0.79  TROPONINIHS 5    Estimated Creatinine Clearance: 143.7 mL/min (by C-G formula based on SCr of 0.79 mg/dL).   Medical History: Past Medical History:  Diagnosis Date  . Diabetes mellitus without complication ([place])   . Hypertension   . Obesity     Medications:  Scheduled:  Infusions:   Assessment: 4 yof with SOB with exertion, CP, and edema in L leg. Duplex showing extensive acute L DVT. No AC PTA.   Hgb 10.2, plt 229. No s/sx of bleeding.   Goal of Therapy:  Heparin level 0.3-0.7 units/ml Monitor platelets by anticoagulation protocol: Yes   Plan:  Heparin bolus 6000 units Start heparin infusion at 1800 units/hr Order heparin level in 6 hr Monitor daily HL, CBC, and for s/sx of bleeding   Antonietta Jewel, PharmD, [place] Pharmacist  Phone: [phone] 07/29/2020 4:30 PM  Please check AMION for all [place] phone numbers After 10:00 PM, call [place] [phone]

## 2020-07-30 DIAGNOSIS — D500 Iron deficiency anemia secondary to blood loss (chronic): Secondary | ICD-10-CM

## 2020-07-30 DIAGNOSIS — I10 Essential (primary) hypertension: Secondary | ICD-10-CM

## 2020-07-30 DIAGNOSIS — I82492 Acute embolism and thrombosis of other specified deep vein of left lower extremity: Secondary | ICD-10-CM

## 2020-07-30 DIAGNOSIS — E041 Nontoxic single thyroid nodule: Secondary | ICD-10-CM

## 2020-07-30 DIAGNOSIS — E1165 Type 2 diabetes mellitus with hyperglycemia: Secondary | ICD-10-CM

## 2020-07-30 LAB — BASIC METABOLIC PANEL
Anion gap: 7 (ref 5–15)
BUN: 7 mg/dL (ref 6–20)
CO2: 25 mmol/L (ref 22–32)
Calcium: 8.9 mg/dL (ref 8.9–10.3)
Chloride: 106 mmol/L (ref 98–111)
Creatinine, Ser: 0.75 mg/dL (ref 0.44–1.00)
GFR, Estimated: >60 mL/min (ref >60)
Glucose, Bld: 199 mg/dL — ABNORMAL HIGH (ref 70–99)
Potassium: 3.7 mmol/L (ref 3.5–5.1)
Sodium: 138 mmol/L (ref 135–145)

## 2020-07-30 LAB — GLUCOSE, CAPILLARY
Glucose-Capillary: 186 mg/dL — ABNORMAL HIGH (ref 70–99)
Glucose-Capillary: 171 mg/dL — ABNORMAL HIGH (ref 70–99)

## 2020-07-30 LAB — CBC
HCT: 30.9 % — ABNORMAL LOW (ref 36.0–46.0)
Hemoglobin: 9.3 g/dL — ABNORMAL LOW (ref 12.0–15.0)
MCH: 22.6 pg — ABNORMAL LOW (ref 26.0–34.0)
MCHC: 30.1 g/dL (ref 30.0–36.0)
MCV: 75 fL — ABNORMAL LOW (ref 80.0–100.0)
Platelets: 225 10*3/uL (ref 150–400)
RBC: 4.12 MIL/uL (ref 3.87–5.11)
RDW: 16.9 % — ABNORMAL HIGH (ref 11.5–15.5)
WBC: 5.1 10*3/uL (ref 4.0–10.5)
nRBC: 0 % (ref 0.0–0.2)

## 2020-07-30 LAB — IRON AND TIBC
Iron: 22 ug/dL — ABNORMAL LOW (ref 28–170)
Saturation Ratios: 7 % — ABNORMAL LOW (ref 10.4–31.8)
TIBC: 325 ug/dL (ref 250–450)
UIBC: 303 ug/dL

## 2020-07-30 LAB — HEMOGLOBIN A1C
Hgb A1c MFr Bld: 9.6 % — ABNORMAL HIGH (ref 4.8–5.6)
Mean Plasma Glucose: 228.82 mg/dL

## 2020-07-30 LAB — TSH: TSH: 1.649 u[IU]/mL (ref 0.350–4.500)

## 2020-07-30 LAB — HEPARIN LEVEL (UNFRACTIONATED)
Heparin Unfractionated: <0.1 IU/mL — ABNORMAL LOW (ref 0.30–0.70)
Heparin Unfractionated: 0.3 IU/mL (ref 0.30–0.70)

## 2020-07-30 LAB — FERRITIN: Ferritin: 10 ng/mL — ABNORMAL LOW (ref 11–307)

## 2020-07-30 LAB — HIV ANTIBODY (ROUTINE TESTING W REFLEX): HIV Screen 4th Generation wRfx: NONREACTIVE

## 2020-07-30 MED ORDER — HEPARIN BOLUS VIA INFUSION
4000.0000 [IU] | Freq: Once | INTRAVENOUS | Status: AC
  Administered 2020-07-30: 4000 [IU] via INTRAVENOUS
  Filled 2020-07-30: qty 4000

## 2020-07-30 MED ORDER — INSULIN ASPART 100 UNIT/ML ~~LOC~~ SOLN
0.0000 [IU] | Freq: Three times a day (TID) | SUBCUTANEOUS | Status: DC
  Administered 2020-07-30 – 2020-07-31 (×2): 4 [IU] via SUBCUTANEOUS
  Administered 2020-07-31: 3 [IU] via SUBCUTANEOUS
  Administered 2020-07-31: 4 [IU] via SUBCUTANEOUS
  Administered 2020-08-01 – 2020-08-02 (×2): 3 [IU] via SUBCUTANEOUS
  Administered 2020-08-02: 4 [IU] via SUBCUTANEOUS

## 2020-07-30 MED ORDER — POLYSACCHARIDE IRON COMPLEX 150 MG PO CAPS
150.0000 mg | ORAL_CAPSULE | Freq: Every day | ORAL | Status: DC
  Administered 2020-07-30 – 2020-08-02 (×4): 150 mg via ORAL
  Filled 2020-07-30 (×4): qty 1

## 2020-07-30 NOTE — Progress Notes (Signed)
   Subjective:  Patient evaluated at bedside this AM. 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 Mentions she often waits in the car and is not very mobile at work. Denies previous clots, hx cancer recent surgery, or recent travel. She believes some of her family members had cancer, but is unsure if they had clots.   Since arrival, patient says her leg pain has been controlled as she has not had to walk. Also denies dyspnea and chest pain.  Objective:  Vital signs in last 24 hours: Vitals:   07/30/20 0056 07/30/20 0443 07/30/20 0813 07/30/20 1215  BP: (!) 147/88 138/86 137/86 132/66  Pulse: 76 82 69 78  Resp: 17 17 16 16   Temp: 98.1 F (36.7 C) 98.1 F (36.7 C) 98.6 F (37 C) 98.9 F (37.2 C)  TempSrc: Oral Oral Oral Oral  SpO2: 98% 99% 96%   Weight:      Height:       Physical Exam: General: Obese, laying in bed, no acute distress CV: Regular rate, rhythm. No murmurs, rubs, gallops. Pulm: Clear to auscultation bilaterally. Normal work or breathing. Extremities: Non-pitting edema left leg > right leg, particularly proximal to knees.  Assessment/Plan: [name] is 50yo person with hypertension, type II diabetes mellitus admitted 2/13 with acute lower left extremity DVT with plans for thrombectomy with vascular surgery tomorrow.  Active Problems:   DVT (deep venous thrombosis) (HCC)  #Acute DVT of LLE Patient this morning reports pain controlled, no chest pain or dyspnea present. She has multiple risk factors for DVT, including immobility, sedentary lifestyle, and obesity. No previous DVT. Unclear if there is a family history of cancer or DVT's. Patient also has not had age-appropriate cancer screenings and does not have PCP. Although unclear, DVT likely provoked given recent job change and increase in sedentary lifestyle. Will continue with IV heparin. Appreciate vascular surgery recommendations, plan for  thrombectomy tomorrow. - C/w heparin gtt - Thrombectomy tomorrow - NPO @[facility]  - Oxycodone 5mg  q4h PRN - Will need to set up with PCP upon discharge - PT/OT  #Type II diabetes mellitus A1c 9.6 on admission. Patient does not take insulin at home. Will titrate insulin while inpatient and  discuss with patient importance of follow-up with primary care physician. - CBG monitoring QID - SSI  #Iron deficiency anemia Anemia appears chronic. No obvious signs of bleeding. Iron studies revealed low iron, ferritin. Iron deficiency anemia could have contributory factor to presentation, as IDA has been linked to hypercoaguable states. Will start oral supplementation. - Start Niferex 150mg  qd - Daily CBC  #Thyromegaly with nodule Incidental finding on imaging on arrival. No previous thyroid disease. TSH normal. Unlikely thyroid cancer, but would benefit from further imaging with ultrasound. Will defer for outpatient work-up. - F/u PCP for thyroid ultrasound  #Hypertension BP stable, will continue with home lisinopril.  DIET: HH, NPO@[facility]  IVF: n/a DVT PPX: heparin gtt BOWEL: Miralax CODE: FULL FAM COM: n/a  Prior to Admission Living Arrangement: Home Anticipated Discharge Location: Pending PT/OT eval Barriers to Discharge: medical management Dispo: Anticipated discharge in approximately 2-3 day(s).   Sanjuan Dame, MD 07/30/2020, 1:05 PM Pager: [phone] After 5pm on weekdays and 1pm on weekends: On Call pager [phone]

## 2020-07-30 NOTE — TOC Initial Note (Addendum)
Transition of Care [facility]) - Initial/Assessment Note    Patient Details  Name: Brooke Hughes MRN: [id number] Date of Birth: Mar 31, 1971  Transition of Care [facility]) CM/SW Contact:    Marilu Favre, RN Phone Number: 07/30/2020, 12:03 PM  Clinical Narrative:                  Patient from home alone. Confirmed face sheet information. Phone number is correct however it is turned off. Patient will pay bill and have it turned on as soon as she is discharge. Her daughter listed is on the same plan and her phone is off also.   Patient's brother is Sunnie Nielsen his number is [phone] , she can be reached through him.   Patient does not have a PCP. Agreeable to have a follow up appointment . NCM scheduled follow up appointment at [facility] August 23, 2020 at 2:30 pm.   Patient does not have insurance. WIll assist with discharge prescriptions through [place] and [facility] program. Patient can also use pharmacy at [facility]   Patient aware.   Expected Discharge Plan: Home/Self Care Barriers to Discharge: Continued Medical Work up   Patient Goals and CMS Choice Patient states their goals for this hospitalization and ongoing recovery are:: to return to home CMS Medicare.gov Compare Post Acute Care list provided to:: Patient    Expected Discharge Plan and Services Expected Discharge Plan: Home/Self Care In-house Referral: Financial Counselor Discharge Planning Services: CM [place]   Living arrangements for the past 2 months: Single Family Home                 DME Arranged: N/A         HH Arranged: NA          Prior Living Arrangements/Services Living arrangements for the past 2 months: Single Family Home Lives with:: Self Patient language and need for interpreter reviewed:: Yes Do you feel safe going back to the place where you live?: Yes      Need for Family  Participation in Patient Care: No (Comment) Care giver support system in place?: Yes (comment)   Criminal Activity/Legal Involvement Pertinent to Current Situation/Hospitalization: No - Comment as needed  Activities of Daily Living Home Assistive Devices/Equipment: Blood pressure cuff,CBG Meter ADL Screening (condition at time of admission) Patient's cognitive ability adequate to safely complete daily activities?: No Is the patient deaf or have difficulty hearing?: No Does the patient have difficulty seeing, even when wearing glasses/contacts?: No Does the patient have difficulty concentrating, remembering, or making decisions?: No Patient able to express need for assistance with ADLs?: No Does the patient have difficulty dressing or bathing?: No Independently performs ADLs?: Yes (appropriate for developmental age) Does the patient have difficulty walking or climbing stairs?: No Weakness of Legs: Left Weakness of Arms/Hands: None  Permission Sought/Granted   Permission granted to share information with : No              Emotional Assessment Appearance:: Appears stated age Attitude/Demeanor/Rapport: Engaged Affect (typically observed): Accepting Orientation: : Oriented to Place,Oriented to  Time,Oriented to Situation,Oriented to Self Alcohol / Substance Use: Not Applicable Psych Involvement: No (comment)  Admission diagnosis:  DVT (deep venous thrombosis) ([place]) [I82.409] Fall, initial encounter [W19.XXXA] Laceration of scalp, initial encounter [S01.01XA] Acute deep vein thrombosis (DVT) of other specified vein of left lower extremity [facility]) [I82.492] Patient Active Problem List   Diagnosis Date Noted  .  DVT (deep venous thrombosis) ([place]) 07/29/2020   PCP:  Patient, No Pcp Per Pharmacy:   [facility] [place] [place] - [address] [place] [address] Phone: [phone] Fax:  [phone]     Social Determinants of Health (SDOH) Interventions    Readmission Risk Interventions No flowsheet data found.

## 2020-07-30 NOTE — Progress Notes (Addendum)
Pt for venous mechanical thrombectomy of left leg.  Dr. Trula Slade will most likely be able to do this tomorrow, but will make final decision today.  Npo/consent/lab orders placed.   Leontine Locket, [facility] 07/30/2020 7:56 AM  I agree with the above.  I am going to try to perform her procedure tomorrow.  She will be n.p.o. after midnight.  Annamarie Major

## 2020-07-30 NOTE — Progress Notes (Signed)
ANTICOAGULATION CONSULT NOTE - Follow Up Consult  Pharmacy Consult for Heparin Indication: DVT  Allergies  Allergen Reactions  . Vicodin [Hydrocodone-Acetaminophen] Hives  . Penicillins Hives and Rash    Patient Measurements: Height: 6\' 2"  (188 cm) Weight: (!) 145.2 kg (320 lb 1.7 oz) IBW/kg (Calculated) : 77.7 Heparin Dosing Weight: 111.5 kg  Vital Signs: Temp: 98.9 F (37.2 C) (02/14 1215) Temp Source: Oral (02/14 1215) BP: 132/66 (02/14 1215) Pulse Rate: 78 (02/14 1215)  Labs: Recent Labs    07/29/20 1214 07/29/20 1734 07/29/20 2301 07/30/20 0713  HGB 10.2*  --   --  9.3*  HCT 32.9*  --   --  30.9*  PLT 229  --   --  225  HEPARINUNFRC  --   --  0.29* <0.10*  CREATININE 0.79  --   --  0.75  TROPONINIHS 5 6  --   --     Estimated Creatinine Clearance: 143.7 mL/min (by C-G formula based on SCr of 0.75 mg/dL).   Medications:  Scheduled:  . insulin aspart  0-20 Units Subcutaneous TID WC  . iron polysaccharides  150 mg Oral Daily  . lisinopril  10 mg Oral Daily   Infusions:  . heparin 2,000 Units/hr (07/30/20 1520)    Assessment: 50 yo F on heparin for extensive LLE DVT.  Tentative plans for thrombectomy 2/15.  Heparin level undetectable this AM despite rate increase.  No IV issues or bleeding noted.  Goal of Therapy:  Heparin level 0.3-0.7 units/ml Monitor platelets by anticoagulation protocol: Yes   Plan:  Heparin bolus 4000 units x 1 Increase heparin to 2400 units/hr Heparin level in 6 hours Daily heparin level and CBC  Milas Schappell, Pharm.D., BCPS Clinical Pharmacist  **Pharmacist phone directory can be found on [email or web address] listed under [place].  07/30/2020 3:50 PM

## 2020-07-30 NOTE — Progress Notes (Signed)
[place] for heparin Indication: DVT  Allergies  Allergen Reactions  . Vicodin [Hydrocodone-Acetaminophen] Hives  . Penicillins Hives and Rash    Patient Measurements: Height: 6\' 2"  (188 cm) Weight: (!) 145.2 kg (320 lb 1.7 oz) IBW/kg (Calculated) : 77.7 Heparin Dosing Weight: 111.5 kg  Vital Signs: Temp: 98.5 F (36.9 C) (02/14 1805) Temp Source: Oral (02/14 1805) BP: 147/77 (02/14 1805) Pulse Rate: 72 (02/14 1805)  Labs: Recent Labs    07/29/20 1214 07/29/20 1734 07/29/20 2301 07/30/20 0713 07/30/20 2142  HGB 10.2*  --   --  9.3*  --   HCT 32.9*  --   --  30.9*  --   PLT 229  --   --  225  --   HEPARINUNFRC  --   --  0.29* <0.10* 0.30  CREATININE 0.79  --   --  0.75  --   TROPONINIHS 5 6  --   --   --     Estimated Creatinine Clearance: 143.7 mL/min (by C-G formula based on SCr of 0.75 mg/dL).  Assessment: 50 y.o. female with LLE DVT for heparin  Goal of Therapy:  Heparin level 0.3-0.7 units/ml Monitor platelets by anticoagulation protocol: Yes   Plan:  Continue Heparin at current rate   Phillis Knack, PharmD, BCPS

## 2020-07-31 LAB — CBC
HCT: 29.6 % — ABNORMAL LOW (ref 36.0–46.0)
Hemoglobin: 9.1 g/dL — ABNORMAL LOW (ref 12.0–15.0)
MCH: 22.8 pg — ABNORMAL LOW (ref 26.0–34.0)
MCHC: 30.7 g/dL (ref 30.0–36.0)
MCV: 74.2 fL — ABNORMAL LOW (ref 80.0–100.0)
Platelets: 218 10*3/uL (ref 150–400)
RBC: 3.99 MIL/uL (ref 3.87–5.11)
RDW: 17 % — ABNORMAL HIGH (ref 11.5–15.5)
WBC: 6.1 10*3/uL (ref 4.0–10.5)
nRBC: 0 % (ref 0.0–0.2)

## 2020-07-31 LAB — BASIC METABOLIC PANEL
Anion gap: 8 (ref 5–15)
BUN: 10 mg/dL (ref 6–20)
CO2: 23 mmol/L (ref 22–32)
Calcium: 8.9 mg/dL (ref 8.9–10.3)
Chloride: 104 mmol/L (ref 98–111)
Creatinine, Ser: 0.79 mg/dL (ref 0.44–1.00)
GFR, Estimated: >60 mL/min (ref >60)
Glucose, Bld: 206 mg/dL — ABNORMAL HIGH (ref 70–99)
Potassium: 3.5 mmol/L (ref 3.5–5.1)
Sodium: 135 mmol/L (ref 135–145)

## 2020-07-31 LAB — GLUCOSE, CAPILLARY
Glucose-Capillary: 197 mg/dL — ABNORMAL HIGH (ref 70–99)
Glucose-Capillary: 128 mg/dL — ABNORMAL HIGH (ref 70–99)
Glucose-Capillary: 200 mg/dL — ABNORMAL HIGH (ref 70–99)
Glucose-Capillary: 158 mg/dL — ABNORMAL HIGH (ref 70–99)

## 2020-07-31 LAB — HEPARIN LEVEL (UNFRACTIONATED): Heparin Unfractionated: 0.41 IU/mL (ref 0.30–0.70)

## 2020-07-31 MED ORDER — SODIUM CHLORIDE 0.9 % IV SOLN
1000.0000 mg | Freq: Once | INTRAVENOUS | Status: AC
  Administered 2020-07-31: 1000 mg via INTRAVENOUS
  Filled 2020-07-31: qty 20

## 2020-07-31 MED ORDER — SODIUM CHLORIDE 0.9 % IV SOLN: INTRAVENOUS | Status: DC

## 2020-07-31 MED ORDER — SODIUM CHLORIDE 0.9 % IV SOLN
25.0000 mg | Freq: Once | INTRAVENOUS | Status: AC
  Administered 2020-07-31: 25 mg via INTRAVENOUS
  Filled 2020-07-31: qty 0.5

## 2020-07-31 NOTE — Progress Notes (Addendum)
Subjective:  Patient evaluated at bedside this AM. Reports minimal leg pain at rest. Denies chest pain, dyspnea, palpitations. Discussed plan for thrombectomy today.  Objective:  Vital signs in last 24 hours: Vitals:   07/30/20 1215 07/30/20 1805 07/31/20 0044 07/31/20 0456  BP: 132/66 (!) 147/77 131/74 136/82  Pulse: 78 72 76 71  Resp: 16 16 18 18   Temp: 98.9 F (37.2 C) 98.5 F (36.9 C) 98 F (36.7 C) 97.9 F (36.6 C)  TempSrc: Oral Oral Oral Oral  SpO2:  96% 96% 97%  Weight:      Height:       Physical Exam: General: Laying in bed, no acute distress CV: Regular rate, rhythm. No m/r/g Pulm: Clear to auscultation bilaterally. Normal WOB. MSK: L leg circumferentially larger than right.  CBC Latest Ref Rng & Units 07/31/2020 07/30/2020 07/29/2020  WBC 4.0 - 10.5 K/uL 6.1 5.1 4.7  Hemoglobin 12.0 - 15.0 g/dL 9.1(L) 9.3(L) 10.2(L)  Hematocrit 36.0 - 46.0 % 29.6(L) 30.9(L) 32.9(L)  Platelets 150 - 400 K/uL 218 225 229   BMP Latest Ref Rng & Units 07/31/2020 07/30/2020 07/29/2020  Glucose 70 - 99 mg/dL 206(H) 199(H) 181(H)  BUN 6 - 20 mg/dL 10 7 7   Creatinine 0.44 - 1.00 mg/dL 0.79 0.75 0.79  Sodium 135 - 145 mmol/L 135 138 137  Potassium 3.5 - 5.1 mmol/L 3.5 3.7 3.5  Chloride 98 - 111 mmol/L 104 106 103  CO2 22 - 32 mmol/L 23 25 23   Calcium 8.9 - 10.3 mg/dL 8.9 8.9 9.1    Assessment/Plan: [name] is 50yo person with hypertension, type II diabetes mellitus admitted 2/13 with acute LLE DVT, now on anticoagulation with plans for thrombectomy this afternoon.  Active Problems:   DVT (deep venous thrombosis) (H[facility])  #Acute DVT of LLE, provoked Patient stable, no acute changes in clinical status. Plans for thrombectomy this afternoon with vascular surgery. Will continue with IV anticoagulation until then. Will plan for initiation of NOAC for at least three months. Can re-assess further need as outpatient. Given her co-morbidities and current occupation, likely will  need to continue with long-term anticoagulation for further prophylaxis. Patient is uninsured and without PCP currently. Will work with [facility] for medications when ready for discharge and set up with [facility] if amendable. Will also defer further hypercoaguable work-up for outpatient setting given she is currently receiving heparin. Appreciate vascular surgery recommendations. - C/w heparin gtt - Thrombectomy today - Oxycodone 5mg  q4h PRN - PT/OT - Outpatient hypercoaguable work-up  #Type II diabetes mellitus A1c 9.6. Patient previously diagnosed with diabetes, but unsure of previous A1c. This AM discussed importance of glycemic control to prevent further cardiovascular events. Plan to set up with [facility] for further management of diabetes. CBG's 180-200's. Patient currently NPO, will continue with same regimen and adjust once she starts eating. - CBG QID - SSI  #Iron deficiency anemia Hgb stable, no signs of active bleeding. Started on oral iron repletion yesterday, will plan for IV ferrous dextran tomorrow. Patient reportedly has history of heavy menses and was found to have subserosal fibroid on CT on admission. Will need to set up with [facility] for orange card for OBGYN referral. - C/w oral iron repletion - IV iron tomorrow AM - F/u w/ [facility]  DIET: NPO, HH s/p thrombectomy IVF: n/a DVT PPX: heparin gtt BOWEL: Miralax CODE: FULL FAM COM: n/a  Prior to Admission Living Arrangement: Home Anticipated Discharge Location: Pending PT/OT eval Barriers to Discharge: medical  management Dispo: Anticipated discharge in approximately 1-2 day(s).   Sanjuan Dame, MD 07/31/2020, 6:41 AM Pager: [phone] After 5pm on weekdays and 1pm on weekends: On Call pager [phone]

## 2020-07-31 NOTE — TOC Progression Note (Signed)
Transition of Care [facility]) - Progression Note    Patient Details  Name: Brooke Hughes MRN: [id number] Date of Birth: 06-30-1970  Transition [facility] Care [facility]) CM/SW Contact  Jacalyn Lefevre Edson Snowball, RN Phone Number: 07/31/2020, 4:50 PM  Clinical Narrative:     Patient in agreement for OP PT at [facility] , referral entered. Ordered 3 in1 with [employer] [facility]  Expected Discharge Plan: Home/Self Care Barriers to Discharge: Continued Medical Work up  Expected Discharge Plan and Services Expected Discharge Plan: Home/Self Care In-house Referral: Development worker, community Discharge Planning Services: [place]   Living arrangements for the past 2 months: Single Family Home                 DME Arranged: N/A         HH Arranged: NA           Social Determinants of Health (SDOH) Interventions    Readmission Risk Interventions No flowsheet data found.

## 2020-07-31 NOTE — Progress Notes (Signed)
ANTICOAGULATION CONSULT NOTE - Follow Up Consult  Pharmacy Consult for Heparin Indication: DVT  Allergies  Allergen Reactions  . Vicodin [Hydrocodone-Acetaminophen] Hives  . Penicillins Hives and Rash    Patient Measurements: Height: 6\' 2"  (188 cm) Weight: (!) 145.2 kg (320 lb 1.7 oz) IBW/kg (Calculated) : 77.7 Heparin Dosing Weight: 111.5 kg  Vital Signs: Temp: 97.9 F (36.6 C) (02/15 0456) Temp Source: Oral (02/15 0456) BP: 136/82 (02/15 0456) Pulse Rate: 71 (02/15 0456)  Labs: Recent Labs    07/29/20 1214 07/29/20 1734 07/29/20 2301 07/30/20 0713 07/30/20 2142 07/31/20 0156  HGB 10.2*  --   --  9.3*  --  9.1*  HCT 32.9*  --   --  30.9*  --  29.6*  PLT 229  --   --  225  --  218  HEPARINUNFRC  --   --    < > <0.10* 0.30 0.41  CREATININE 0.79  --   --  0.75  --  0.79  TROPONINIHS 5 6  --   --   --   --    < > = values in this interval not displayed.    Estimated Creatinine Clearance: 143.7 mL/min (by C-G formula based on SCr of 0.79 mg/dL).   Medications:  Scheduled:  . insulin aspart  0-20 Units Subcutaneous TID WC  . iron polysaccharides  150 mg Oral Daily  . lisinopril  10 mg Oral Daily   Infusions:  . sodium chloride    . heparin 2,400 Units/hr (07/31/20 0247)    Assessment: 50 yo F on heparin for extensive LLE DVT.  Tentative plans for thrombectomy 2/15.  Heparin level undetectable this AM despite rate increase.  No IV issues or bleeding noted.   HL came back therapeutic this AM. Hgb remains stable. Plan for thrombectomy today. We will f/u with anticoagulation post procedure.   Goal of Therapy:  Heparin level 0.3-0.7 units/ml Monitor platelets by anticoagulation protocol: Yes   Plan:  Cont heparin 2400 units/hr F/u with anticoagulation post procedure Daily heparin level and CBC  Onnie Boer, PharmD, BCIDP, AAHIVP, CPP Infectious Disease Pharmacist 07/31/2020 8:21 AM

## 2020-07-31 NOTE — Progress Notes (Signed)
Pt received tester dose of Iron tolerated well no rxn noted Hrs 60s-80s. Pharmacy notified and tubing bigger bag

## 2020-07-31 NOTE — Evaluation (Signed)
Occupational Therapy Evaluation Patient Details Name: Brooke Hughes MRN: [id number] DOB: 1970/08/22 Today's Date: 07/31/2020    History of Present Illness Pt is a 50 y.o. female who reported to the ED with DOE, SOB, and L>R LE edema. 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 In ED, pt was diagnosed with L DVT in external iliac vein. PE was ruled out. Pt planned to undergo mechanical thrombectomy 07/31/20. PMH is signficant for DM, HTN, Covid in 2020, 2 premature births, and obesity (BMI = 41).   Clinical Impression   Pt PTA: Pt living at home with son. Pt currently, Pt requires increased time, but pt performing UB/LB ADL with set-upA only. Pt standing for toilet hygiene and standing at sink for grooming. Pt navigating 4 steps (all that IV would allow) with supervisionA. Pt with no LOB episodes. Edema and "pressure" felt in LLE and in though groin area so pt having to turn her LLE outward. Pt education on the importance of movement and not being sedentary. Light exercise HEP education provided.  Pt supervisionA to set-upA for ADL and supervisionA overall for mobility with no AD. Pt does not require continued OT skilled services. OT signing off.    Follow Up Recommendations  No OT follow up    Equipment Recommendations  None recommended by OT    Recommendations for Other Services       Precautions / Restrictions Precautions Precautions: Fall Restrictions Weight Bearing Restrictions: No      Mobility Bed Mobility Overal bed mobility: Needs Assistance Bed Mobility: Supine to Sit     Supine to sit: Supervision Sit to supine: Supervision        Transfers Overall transfer level: Needs assistance Equipment used: None Transfers: Sit to/from Stand Sit to Stand: Supervision         General transfer comment: No LOB for initial stand and supervisionA for IV pole    Balance Overall balance assessment: Needs assistance Sitting-balance support:  Feet supported;No upper extremity supported Sitting balance-Leahy Scale: Good       Standing balance-Leahy Scale: Fair                             ADL either performed or assessed with clinical judgement   ADL Overall ADL's : At baseline;Modified independent                                       General ADL Comments: Pt requires increased time, but pt performing UB/LB ADL with set-upA only. Pt standing for toilet hygiene and standing at sink for grooming. Pt navigating 4 steps (all that IV would allow) with supervisionA. Pt with no LOB episodes. edema in LLE and in though groin area so pt having to turn her LLE outward. Pt education on the importance of movement and not being so sedentary.     Vision Baseline Vision/History: No visual deficits Patient Visual Report: No change from baseline Vision Assessment?: No apparent visual deficits     Perception     Praxis      Pertinent Vitals/Pain Pain Assessment: Faces Faces Pain Scale: Hurts a little bit Pain Location: L LE Pain Descriptors / Indicators: Tightness;Pressure Pain Intervention(s): Monitored during session;Premedicated before session;Repositioned     Hand Dominance Right   Extremity/Trunk Assessment Upper Extremity Assessment Upper Extremity Assessment: Generalized weakness  Lower Extremity Assessment Lower Extremity Assessment: Generalized weakness   Cervical / Trunk Assessment Cervical / Trunk Assessment: Other exceptions Cervical / Trunk Exceptions: tall and large body habitus   Communication Communication Communication: No difficulties   Cognition Arousal/Alertness: Awake/alert Behavior During Therapy: WFL for tasks assessed/performed Overall Cognitive Status: No family/caregiver present to determine baseline cognitive functioning                                 General Comments: Noted descepancies between pt history given to OT and history given to PT (amont  of steps, number of stories in home)   General Comments  Pt with decreased awareness of length of IV pole for stair training as pt continued to go up more than 4 steps eventhough, she was asked to turn around.    Exercises     Shoulder Instructions      Home Living Family/patient expects to be discharged to:: Private residence Living Arrangements: Children Available Help at Discharge: Family;Available PRN/intermittently Type of Home: Apartment Home Access: Stairs to enter Entrance Stairs-Number of Steps: 9 Entrance Stairs-Rails: Right Home Layout: One level Alternate Level Stairs-Number of Steps: 14 Alternate Level Stairs-Rails: Right Bathroom Shower/Tub: Teacher, early years/pre: Standard     Home Equipment: None          Prior Functioning/Environment Level of Independence: Independent                 OT Problem List:        OT Treatment/Interventions:      OT Goals(Current goals can be found in the care plan section) Acute Rehab OT Goals Patient Stated Goal: exercise more, get stronger OT Goal Formulation: All assessment and education complete, DC therapy  OT Frequency:     Barriers to D/C:            Co-evaluation              AM-PAC OT "6 Clicks" Daily Activity     Outcome Measure Help from another person eating meals?: None Help from another person taking care of personal grooming?: None Help from another person toileting, which includes using toliet, bedpan, or urinal?: None Help from another person bathing (including washing, rinsing, drying)?: None Help from another person to put on and taking off regular upper body clothing?: None Help from another person to put on and taking off regular lower body clothing?: None 6 Click Score: 24   End of Session Nurse Communication: Mobility status  Activity Tolerance: Patient tolerated treatment well Patient left: in chair;with call bell/phone within reach  OT Visit Diagnosis:  Unsteadiness on feet (R26.81);Muscle weakness (generalized) (M62.81)                Time: 2585-2778 OT Time Calculation (min): 26 min Charges:  OT General Charges $OT Visit: 1 Visit OT Evaluation $OT Eval Moderate Complexity: 1 Mod OT Treatments $Self Care/Home Management : 8-22 mins  Jefferey Pica, OTR/L Acute Rehabilitation Services Pager: [phone] Office: [phone]   Makenzie Weisner C 07/31/2020, 5:15 PM

## 2020-07-31 NOTE — Evaluation (Signed)
Physical Therapy Evaluation Patient Details Name: Brooke Hughes MRN: [id number] DOB: 08-31-1970 Today's Date: 07/31/2020   History of Present Illness  Pt is a 50 y.o. female who reported to the ED with DOE, SOB, and L>R LE edema. 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 In ED, pt was diagnosed with L DVT in external iliac vein. PE was ruled out. Pt planned to undergo mechanical thrombectomy 07/31/20. PMH is signficant for DM, HTN, Covid in 2020, 2 premature births, and obesity (BMI = 41).    Clinical Impression  Pt received in chair, pleasant and cooperative. Pt was min guard for transfers and ambulation in room ~50 ft with no AD. Pt lives with son and has multiple steps. Unclear how many steps or stories in home since there were discrepancies between pt history given to OT and pt history given to PT. Pt able to follow commands consistently, but decreased awareness of length of IV line and needed cueing to stay in safe distance to IV pole. Educated pt on importance of exercise to prevent future blood clots and taught pt ankle pumps. Pt demonstrated understanding. Pt left in bed prior to planned procedure with all needs met and call bell within reach. Recommend outpatient PT but barriers presented by pt's insurance situation. Likely will need to maximize PT during admission.    Follow Up Recommendations Outpatient PT (Limited by insurance situation.)    Equipment Recommendations  3in1 (PT)    Recommendations for Other Services       Precautions / Restrictions Precautions Precautions: Fall Restrictions Weight Bearing Restrictions: No      Mobility  Bed Mobility Overal bed mobility: Needs Assistance Bed Mobility: Sit to Supine       Sit to supine: Supervision        Transfers Overall transfer level: Needs assistance Equipment used: Rolling walker (2 wheeled) Transfers: Sit to/from Stand Sit to Stand: Min guard             Ambulation/Gait Ambulation/Gait assistance: Min guard Gait Distance (Feet): 50 Feet (Did 10 ft with RW with no LOB. 40 ft with no AD) Assistive device: None Gait Pattern/deviations: Step-through pattern;Decreased step length - right;Decreased step length - left;Decreased weight shift to left Gait velocity: Decreased   General Gait Details: No overt LOB, fairly steady with no AD, assistance to manage IV  Stairs            Wheelchair Mobility    Modified Rankin (Stroke Patients Only)       Balance Overall balance assessment: Needs assistance Sitting-balance support: Feet supported;No upper extremity supported Sitting balance-Leahy Scale: Good       Standing balance-Leahy Scale: Fair                               Pertinent Vitals/Pain Pain Assessment: Faces Faces Pain Scale: Hurts little more Pain Location: L LE Pain Descriptors / Indicators: Constant;Discomfort Pain Intervention(s): Monitored during session    Home Living Family/patient expects to be discharged to:: Private residence Living Arrangements: Children Available Help at Discharge: Family;Available PRN/intermittently Type of Home: Apartment Home Access: Stairs to enter Entrance Stairs-Rails: Right Entrance Stairs-Number of Steps: 9 Home Layout: Two level Home Equipment: None      Prior Function Level of Independence: Independent               Hand Dominance  Extremity/Trunk Assessment   Upper Extremity Assessment Upper Extremity Assessment: Defer to OT evaluation    Lower Extremity Assessment Lower Extremity Assessment: Overall WFL for tasks assessed       Communication   Communication: No difficulties  Cognition Arousal/Alertness: Awake/alert Behavior During Therapy: WFL for tasks assessed/performed Overall Cognitive Status: No family/caregiver present to determine baseline cognitive functioning                                 General  Comments: Noted descepancies between pt history given to OT and history given to PT (amont of steps, number of stories in home)      General Comments General comments (skin integrity, edema, etc.): Pt insisted on remaking bed. Able to maintain balance while doing so but needed tactile and verbal cueing that IV length would not be long enough for her to reach across bed safely. Pt with decreased safety awareness related to length of IV.    Exercises     Assessment/Plan    PT Assessment Patient needs continued PT services  PT Problem List Decreased strength;Decreased mobility;Decreased coordination;Decreased activity tolerance;Decreased balance;Pain;Decreased safety awareness       PT Treatment Interventions Therapeutic exercise;Gait training;Balance training;Stair training;Neuromuscular re-education;Functional mobility training;Therapeutic activities;Patient/family education    PT Goals (Current goals can be found in the Care Plan section)  Acute Rehab PT Goals Patient Stated Goal: exercise more, get stronger PT Goal Formulation: With patient Potential to Achieve Goals: Good    Frequency Min 4X/week (Maximize PT services due to pt's insurance status)   Barriers to discharge        Co-evaluation               AM-PAC PT "6 Clicks" Mobility  Outcome Measure Help needed turning from your back to your side while in a flat bed without using bedrails?: None Help needed moving from lying on your back to sitting on the side of a flat bed without using bedrails?: A Little Help needed moving to and from a bed to a chair (including a wheelchair)?: A Little Help needed standing up from a chair using your arms (e.g., wheelchair or bedside chair)?: A Little Help needed to walk in hospital room?: A Little Help needed climbing 3-5 steps with a railing? : A Little 6 Click Score: 19    End of Session Equipment Utilized During Treatment: Gait belt Activity Tolerance: Patient tolerated  treatment well Patient left: in bed;with call bell/phone within reach Nurse Communication: Mobility status PT Visit Diagnosis: Unsteadiness on feet (R26.81);Muscle weakness (generalized) (M62.81);Pain Pain - Right/Left: Left Pain - part of body: Leg    Time:  -      Charges:             Rosita Kea, SPT

## 2020-07-31 NOTE — Progress Notes (Signed)
Venous thrombectomy procedure had to be canceled today.  She is on the schedule for tomorrow.  She needs to be n.p.o. after midnight.  Brooke Hughes

## 2020-07-31 NOTE — Progress Notes (Signed)
PT Cancellation Note  Patient Details Name: Brooke Hughes MRN: [id number] DOB: 10/17/1970   Cancelled Treatment:    Reason Eval/Treat Not Completed: Other (comment) made multiple attempts to see patient- on first attempt IV team was working with her, on second attempt she was busy attempting to have BM and she asked that we come back. Will attempt if time/schedule allow, otherwise will return on next date of service.    Windell Norfolk, DPT, PN1   Supplemental Physical Therapist [facility]    Pager [phone] Acute Rehab Office [phone]

## 2020-08-01 ENCOUNTER — Inpatient Hospital Stay (HOSPITAL_COMMUNITY)
Admission: EM | Disposition: A | Discharge status: Home / Self Care | Payer: Self-pay | Source: Home / Self Care | Attending: Internal Medicine

## 2020-08-01 HISTORY — PX: PERIPHERAL VASCULAR THROMBECTOMY: CATH118306

## 2020-08-01 HISTORY — PX: PERIPHERAL VASCULAR INTERVENTION: CATH118257

## 2020-08-01 LAB — BASIC METABOLIC PANEL
Anion gap: 9 (ref 5–15)
BUN: 7 mg/dL (ref 6–20)
CO2: 21 mmol/L — ABNORMAL LOW (ref 22–32)
Calcium: 8.7 mg/dL — ABNORMAL LOW (ref 8.9–10.3)
Chloride: 107 mmol/L (ref 98–111)
Creatinine, Ser: 0.75 mg/dL (ref 0.44–1.00)
GFR, Estimated: >60 mL/min (ref >60)
Glucose, Bld: 163 mg/dL — ABNORMAL HIGH (ref 70–99)
Potassium: 3.5 mmol/L (ref 3.5–5.1)
Sodium: 137 mmol/L (ref 135–145)

## 2020-08-01 LAB — URINALYSIS, ROUTINE W REFLEX MICROSCOPIC
Bilirubin Urine: NEGATIVE
Glucose, UA: NEGATIVE mg/dL
Hgb urine dipstick: NEGATIVE
Ketones, ur: NEGATIVE mg/dL
Leukocytes,Ua: NEGATIVE
Nitrite: NEGATIVE
Protein, ur: NEGATIVE mg/dL
Specific Gravity, Urine: 1.024 (ref 1.005–1.030)
pH: 5 (ref 5.0–8.0)

## 2020-08-01 LAB — CBC
HCT: 27 % — ABNORMAL LOW (ref 36.0–46.0)
Hemoglobin: 8.9 g/dL — ABNORMAL LOW (ref 12.0–15.0)
MCH: 23.9 pg — ABNORMAL LOW (ref 26.0–34.0)
MCHC: 33 g/dL (ref 30.0–36.0)
MCV: 72.6 fL — ABNORMAL LOW (ref 80.0–100.0)
Platelets: 221 10*3/uL (ref 150–400)
RBC: 3.72 MIL/uL — ABNORMAL LOW (ref 3.87–5.11)
RDW: 17.1 % — ABNORMAL HIGH (ref 11.5–15.5)
WBC: 6.7 10*3/uL (ref 4.0–10.5)
nRBC: 0 % (ref 0.0–0.2)

## 2020-08-01 LAB — GLUCOSE, CAPILLARY
Glucose-Capillary: 155 mg/dL — ABNORMAL HIGH (ref 70–99)
Glucose-Capillary: 147 mg/dL — ABNORMAL HIGH (ref 70–99)
Glucose-Capillary: 104 mg/dL — ABNORMAL HIGH (ref 70–99)
Glucose-Capillary: 150 mg/dL — ABNORMAL HIGH (ref 70–99)

## 2020-08-01 LAB — POCT ACTIVATED CLOTTING TIME
Activated Clotting Time: 184 seconds
Activated Clotting Time: 220 seconds

## 2020-08-01 LAB — HEPARIN LEVEL (UNFRACTIONATED): Heparin Unfractionated: 0.53 IU/mL (ref 0.30–0.70)

## 2020-08-01 SURGERY — PERIPHERAL VASCULAR THROMBECTOMY
Anesthesia: LOCAL

## 2020-08-01 MED ORDER — MIDAZOLAM HCL 2 MG/2ML IJ SOLN
INTRAMUSCULAR | Status: AC
  Filled 2020-08-01: qty 2

## 2020-08-01 MED ORDER — ASPIRIN EC 81 MG PO TBEC
81.0000 mg | DELAYED_RELEASE_TABLET | Freq: Every day | ORAL | Status: DC
  Administered 2020-08-02: 81 mg via ORAL
  Filled 2020-08-01: qty 1

## 2020-08-01 MED ORDER — HEPARIN SODIUM (PORCINE) 1000 UNIT/ML IJ SOLN
INTRAMUSCULAR | Status: AC
  Filled 2020-08-01: qty 1

## 2020-08-01 MED ORDER — MIDAZOLAM HCL 2 MG/2ML IJ SOLN
INTRAMUSCULAR | Status: DC | PRN
  Administered 2020-08-01 (×2): 1 mg via INTRAVENOUS
  Administered 2020-08-01: 2 mg via INTRAVENOUS
  Administered 2020-08-01: 1 mg via INTRAVENOUS

## 2020-08-01 MED ORDER — FENTANYL CITRATE (PF) 100 MCG/2ML IJ SOLN
INTRAMUSCULAR | Status: AC
  Filled 2020-08-01: qty 2

## 2020-08-01 MED ORDER — IODIXANOL 320 MG/ML IV SOLN
INTRAVENOUS | Status: DC | PRN
  Administered 2020-08-01: 30 mL via INTRAVENOUS

## 2020-08-01 MED ORDER — FENTANYL CITRATE (PF) 100 MCG/2ML IJ SOLN
INTRAMUSCULAR | Status: DC | PRN
  Administered 2020-08-01: 25 ug via INTRAVENOUS
  Administered 2020-08-01 (×2): 50 ug via INTRAVENOUS
  Administered 2020-08-01 (×2): 25 ug via INTRAVENOUS

## 2020-08-01 MED ORDER — HEPARIN (PORCINE) IN NACL 1000-0.9 UT/500ML-% IV SOLN
INTRAVENOUS | Status: DC | PRN
  Administered 2020-08-01: 500 mL

## 2020-08-01 MED ORDER — ASPIRIN EC 81 MG PO TBEC: 81.0000 mg | DELAYED_RELEASE_TABLET | Freq: Every day | ORAL | Status: DC

## 2020-08-01 MED ORDER — LIDOCAINE HCL (PF) 1 % IJ SOLN
INTRAMUSCULAR | Status: AC
  Filled 2020-08-01: qty 30

## 2020-08-01 MED ORDER — ONDANSETRON HCL 4 MG/2ML IJ SOLN: 4.0000 mg | Freq: Four times a day (QID) | INTRAMUSCULAR | Status: DC | PRN

## 2020-08-01 MED ORDER — LABETALOL HCL 5 MG/ML IV SOLN
10.0000 mg | INTRAVENOUS | Status: DC | PRN
Start: 2020-08-01 — End: 2020-08-02

## 2020-08-01 MED ORDER — HEPARIN (PORCINE) IN NACL 1000-0.9 UT/500ML-% IV SOLN
INTRAVENOUS | Status: AC
  Filled 2020-08-01: qty 1500

## 2020-08-01 MED ORDER — SODIUM CHLORIDE 0.9 % IV SOLN: 250.0000 mL | INTRAVENOUS | Status: DC | PRN

## 2020-08-01 MED ORDER — HYDRALAZINE HCL 20 MG/ML IJ SOLN
5.0000 mg | INTRAMUSCULAR | Status: DC | PRN
Start: 2020-08-01 — End: 2020-08-02

## 2020-08-01 MED ORDER — HEPARIN SODIUM (PORCINE) 1000 UNIT/ML IJ SOLN
INTRAMUSCULAR | Status: DC | PRN
  Administered 2020-08-01: 6000 [IU] via INTRAVENOUS
  Administered 2020-08-01: 3000 [IU] via INTRAVENOUS
  Administered 2020-08-01: 8000 [IU] via INTRAVENOUS

## 2020-08-01 MED ORDER — SODIUM CHLORIDE 0.9% FLUSH: 3.0000 mL | Freq: Two times a day (BID) | INTRAVENOUS | Status: DC

## 2020-08-01 MED ORDER — SODIUM CHLORIDE 0.9 % IV SOLN: INTRAVENOUS | Status: AC

## 2020-08-01 MED ORDER — SENNOSIDES-DOCUSATE SODIUM 8.6-50 MG PO TABS
1.0000 | ORAL_TABLET | Freq: Every day | ORAL | Status: DC
  Administered 2020-08-02: 1 via ORAL
  Filled 2020-08-01: qty 1

## 2020-08-01 MED ORDER — LIDOCAINE HCL (PF) 1 % IJ SOLN
INTRAMUSCULAR | Status: DC | PRN
  Administered 2020-08-01: 30 mL via INTRADERMAL

## 2020-08-01 MED ORDER — SODIUM CHLORIDE 0.9% FLUSH: 3.0000 mL | INTRAVENOUS | Status: DC | PRN

## 2020-08-01 SURGICAL SUPPLY — 20 items
BALLN ATLAS 14X40X75 (BALLOONS) ×3
BALLN ATLAS 16X40X75 (BALLOONS) ×3
BALLOON ATLAS 14X40X75 (BALLOONS) IMPLANT
BALLOON ATLAS 16X40X75 (BALLOONS) IMPLANT
CATH ANGIO 5F BER2 100CM (CATHETERS) ×1 IMPLANT
CATH INFINITI VERT 5FR 125CM (CATHETERS) ×1 IMPLANT
CATH RETRIEVER CLOT 16MMX105CM (CATHETERS) ×1 IMPLANT
CATH VISIONS PV .035 IVUS (CATHETERS) ×2 IMPLANT
COVER DOME SNAP 22 D (MISCELLANEOUS) ×1 IMPLANT
GLIDEWIRE ADV .035X260CM (WIRE) ×1 IMPLANT
KIT ENCORE 26 ADVANTAGE (KITS) ×1 IMPLANT
KIT MICROPUNCTURE NIT STIFF (SHEATH) ×1 IMPLANT
PROTECTION STATION PRESSURIZED (MISCELLANEOUS) ×3
SHEATH AVANTI 11F 11CM (SHEATH) ×1 IMPLANT
SHEATH CLOT RETRIEVER (SHEATH) ×1 IMPLANT
SHEATH PINNACLE 8F 10CM (SHEATH) ×1 IMPLANT
STATION PROTECTION PRESSURIZED (MISCELLANEOUS) IMPLANT
STENT WALLSTENT 18X90X75 (Permanent Stent) ×1 IMPLANT
WIRE AMPLATZ SS-J .035X260CM (WIRE) ×1 IMPLANT
WIRE MICROINTRODUCER 60CM (WIRE) ×2 IMPLANT

## 2020-08-01 NOTE — Progress Notes (Signed)
ANTICOAGULATION CONSULT NOTE - Follow Up Consult  Pharmacy Consult for Heparin Indication: DVT  Allergies  Allergen Reactions  . Vicodin [Hydrocodone-Acetaminophen] Hives  . Penicillins Hives and Rash    Patient Measurements: Height: 6\' 2"  (188 cm) Weight: (!) 145.2 kg (320 lb 1.7 oz) IBW/kg (Calculated) : 77.7 Heparin Dosing Weight: 111.5 kg  Vital Signs: Temp: 98.5 F (36.9 C) (02/16 1159) Temp Source: Oral (02/16 1159) BP: 161/100 (02/16 1616) Pulse Rate: 0 (02/16 1626)  Labs: Recent Labs    07/29/20 1734 07/29/20 2301 07/30/20 0713 07/30/20 2142 07/31/20 0156 08/01/20 0103  HGB  --    < > 9.3*  --  9.1* 8.9*  HCT  --   --  30.9*  --  29.6* 27.0*  PLT  --   --  225  --  218 221  HEPARINUNFRC  --    < > <0.10* 0.30 0.41 0.53  CREATININE  --   --  0.75  --  0.79 0.75  TROPONINIHS 6  --   --   --   --   --    < > = values in this interval not displayed.    Estimated Creatinine Clearance: 143.7 mL/min (by C-G formula based on SCr of 0.75 mg/dL).  Assessment: 50 yr old female is on heparin for extensive LLE DVT. Pt is S/P thrombectomy this afternoon. Pt has had therapeutic heparin levels on heparin infusion at 2400 units/hr (last level was 0.53 units/ml earlier today). H/H 8.9/27.0, plt 221.  Heparin infusion was continued during the procedure this afternoon. Per RN, no issues with IV or bleeding observed post procedure.  Goal of Therapy:  Heparin level 0.3-0.7 units/ml Monitor platelets by anticoagulation protocol: Yes   Plan:  Continue heparin infusion at 2400 units/hr Monitor daily heparin level and CBC Monitor for bleeding F/U anticoagulation plans  Gillermina Hu, PharmD, BCPS, [facility] Clinical Pharmacist 08/01/2020 4:52 PM

## 2020-08-01 NOTE — Progress Notes (Signed)
   Subjective:  Patient evaluated at bedside this AM. Patient reports dysuria that began over the last day. She also endorses frontal headache that began earlier this morning. Denies acute vision changes, chest pain, dyspnea, palpitations. Also mentions she feels constipated. Denies abdominal pain, nausea, vomiting.   Objective:  Vital signs in last 24 hours: Vitals:   07/31/20 1157 07/31/20 1711 07/31/20 2313 08/01/20 0530  BP: (!) 143/86 128/75 (!) 144/79 140/89  Pulse: 68 81 73 65  Resp: 18 16 18 16   Temp: 98.6 F (37 C) 98.8 F (37.1 C) 98.5 F (36.9 C) 98.9 F (37.2 C)  TempSrc: Oral Oral Oral Oral  SpO2: 96% 97% 95% 96%  Weight:      Height:       Physical Exam: General: Sitting up in chair, no acute distress CV: Regular rate, rhythm. No m/r/g Pulm: Clear to auscultation bilaterally. Normal WOB. Abdomen: Soft, non-distended. Mild epigastric abdominal pain. MSK: L leg non-pitting edema. R leg non-edematous. Neuro: Awake, alert, oriented x4. CN in tact. Strength, sensation in tact throughout.  Assessment/Plan: [name] is 50yo person with hypertension, type II diabetes mellitus admitted 2/13 with acute LLE DVT, on IV heparin scheduled for thrombectomy later this afternoon.  Active Problems:   DVT (deep venous thrombosis) (HCC)  #Acute provoked DVT LLE No acute changes. Thrombectomy had to be re-scheduled today per vascular surgery. Will continue with IV heparin, discuss with vascular surgery plan for anticoagulation in perioperative time. Patient will need at least three months of anticoagulation and will complete hypercoagulable work-up in outpatient setting. Appreciate vascular surgery recommendations. - C/w heparin gtt - Thrombectomy today - Discuss AC w/ vascular surgery - PT/OT - Outpatient hypercoagulable work-up  #Type II diabetes mellitus A1c 9.6. CBG's at goal <180. Will continue to monitor CBG's as she is able to eat after surgery today. - CBG  monitoring QID - SSI  #Iron deficiency anemia Hgb stable, no signs of bleeding. IV iron infusion yesterday, will continue oral repletion today.  - C/w Nifedex 150mg  qd - Daily CBC - F/u [facility]  #Headache New-onset headache this AM. Currently on anticoagulation for DVT. Neurologically in tact, no deficits. Will give tylenol PRN and continue to monitor. - Tylenol 650mg  q6h PRN  #Dysuria Patient reports dysuria that began yesterday. No fevers, chills. Appears alert and oriented on exam. UA this morning negative, will hold antibiotics. - Continue to monitor  #Constipation Patient reports feeling full in lower abdomen, no recent BM. - Senokot-S daily - Miralax daily PRN  DIET: NPO, HH s/p thrombectomy IVF: n/a DVT PPX: heparin gtt BOWEL: Senokot-S, Miralax CODE: FULL FAM COM: n/a  Prior to Admission Living Arrangement: Home Anticipated Discharge Location: Home Barriers to Discharge: medical management Dispo: Anticipated discharge in approximately 1-2 day(s).   Sanjuan Dame, MD 08/01/2020, 6:41 AM Pager: [phone] After 5pm on weekdays and 1pm on weekends: On Call pager [phone]

## 2020-08-01 NOTE — Op Note (Signed)
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  Ultrasound-guided access of left popliteal vein 2.  Intravascular ultrasound (IVUS) of left popliteal, superficial femoral, common femoral, external iliac, common iliac vein and IVC 3.  Percutaneous mechanical thrombectomy of left popliteal vein, superficial femoral, common femoral, external iliac and common iliac vein (Innari ClotTreiver) 4.  Angioplasty and stent of left common and external iliac vein (18 mm x 90 mm Wallstent) postdilated with a 16 mm Atlas 5.  Left iliac venogram 6.  130 minutes of monitored moderate conscious sedation time  Indications: 50 year old female who was seen by my partner Dr. Trula Slade with extensive left leg DVT including iliac vein involvement.  She presents today for percutaneous mechanical thrombectomy and evaluation with IVUS given extensive left leg swelling after risks and benefits discussed.  Findings:   IVUS confirmed both acute and chronic thrombus in the left popliteal, superficial femoral, common femoral and profunda vein as well as the external iliac and distal common iliac vein.  The IVC was free of thrombus.  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  The Innari ClotTreiverer was used with a total of 7 passes retrieving mostly chronic and some acute thrombus.  This look much better on IVUS and then we primarily stented the left common and external iliac vein with a 18 mm x 90 mm Wallstent postdilated with a 16 mm Atlas balloon.  Anagrams showed widely patent left iliac stent with excellent flow and no residual  stenosis.   Procedure:  The patient was identified in the holding area and taken to room 8.  Patient was then placed prone on the table.  The left popliteal fossa was prepped and draped in standard sterile fashion.  Timeout was performed.  I initially used ultrasound to evaluate the left popliteal fossa unfortunately she has a obese leg with significant edema and swelling and I had a significant difficulty visualizing any popliteal vein but I could identify the artery and ultimately spent a long time getting access trying to find venous access with very poor visualization.  Finally I was able to get a needle with venous backbleeding beside the artery and I was able to advance a micro access needle and placed a micro sheath after the microwire was advanced.  We confirmed on hand-injection that we were indeed in the popliteal vein.  I then used a Glidewire advantage to cross the superficial femoral vein into the common femoral vein and then iliac vein and IVC.  I then placed a 8 French sheath in the left popliteal vein.  Patient was given 6000 units of IV heparin.  We checked ACT's and then gave an additional 8000 units and 3000 units IV heparin during the case to get therapeutic.  Ultimately I was able to get my wire with a BEr 2 catheter up into the right subclavian vein.  I then performed IVUS of the left popliteal, superficial femoral, common femoral, external and common iliac vein into the IVC.  Pertinent findings noted above but there was acute and chronic thrombus in the left popliteal, superficial femoral, common femoral profunda external and common iliac vein.  I then elected to perform percutaneous mechanical  thrombectomy and ultimately I tried to place the Inari clotTreiver sheath in the left popliteal vein but had trouble advancing the sheath given the depth of her popliteal vein that was likely kinking the wire.  I then exchanged for a stiff Amplatz and was finally able to get the Inari clot Treiver  sheath in place.  I then used the Inari clot Treiver and made a total of 7 passes deploying the basket in the IVC and then dragging it through the left iliac vein into the femoral vein and then collapsing and removing the sheath.  We retrieved large volumes with acute and chronic thrombus.  That point time I then IVUS again with much better clearance of the clot.  We did identify 65% stenosis in the left common iliac vein.  This was then primarily stented with an 18 x 90 mm Wallstent postdilated with a 16 mm Atlas.  We did have to put this in bareback given she was very tall we had trouble reaching the lesion from popliteal access.  Final IVUS showed good wall apposition of the stent and we did a final venogram that looked excellent.  That point time wires and catheters were removed and I tied a 4-0 pursestring down around the sheath and removed it.  She remained stable.  Plan: Patient will remain on heparin and this was not held during the case   Marty Heck, MD [facility]: [phone]

## 2020-08-01 NOTE — Progress Notes (Signed)
ANTICOAGULATION CONSULT NOTE - Follow Up Consult  Pharmacy Consult for Heparin Indication: DVT  Allergies  Allergen Reactions  . Vicodin [Hydrocodone-Acetaminophen] Hives  . Penicillins Hives and Rash    Patient Measurements: Height: 6\' 2"  (188 cm) Weight: (!) 145.2 kg (320 lb 1.7 oz) IBW/kg (Calculated) : 77.7 Heparin Dosing Weight: 111.5 kg  Vital Signs: Temp: 98.9 F (37.2 C) (02/16 0530) Temp Source: Oral (02/16 0530) BP: 140/89 (02/16 0530) Pulse Rate: 65 (02/16 0530)  Labs: Recent Labs    07/29/20 1214 07/29/20 1734 07/29/20 2301 07/30/20 0713 07/30/20 2142 07/31/20 0156 08/01/20 0103  HGB 10.2*  --   --  9.3*  --  9.1* 8.9*  HCT 32.9*  --   --  30.9*  --  29.6* 27.0*  PLT 229  --   --  225  --  218 221  HEPARINUNFRC  --   --    < > <0.10* 0.30 0.41 0.53  CREATININE 0.79  --   --  0.75  --  0.79 0.75  TROPONINIHS 5 6  --   --   --   --   --    < > = values in this interval not displayed.    Estimated Creatinine Clearance: 143.7 mL/min (by C-G formula based on SCr of 0.75 mg/dL).   Medications:  Scheduled:  . insulin aspart  0-20 Units Subcutaneous TID WC  . iron polysaccharides  150 mg Oral Daily  . lisinopril  10 mg Oral Daily   Infusions:  . sodium chloride 100 mL/hr at 07/31/20 0859  . heparin 2,400 Units/hr (08/01/20 0836)    Assessment: 50 yo F on heparin for extensive LLE DVT.  Tentative plans for thrombectomy 2/15.  Heparin level undetectable this AM despite rate increase.  No IV issues or bleeding noted.   HL continues to be therapeutic. Hgb remains stable ~9. Thrombectomy had to be rescheduled to today. We will f/u with anticoagulation post procedure.   Goal of Therapy:  Heparin level 0.3-0.7 units/ml Monitor platelets by anticoagulation protocol: Yes   Plan:  Cont heparin 2400 units/hr F/u with anticoagulation post procedure Daily heparin level and CBC  Onnie Boer, PharmD, BCIDP, AAHIVP, CPP Infectious Disease  Pharmacist 08/01/2020 8:56 AM

## 2020-08-01 NOTE — Progress Notes (Signed)
    Subjective  -   No change in left leg edema   Physical Exam:  Palpable pedal pulses Left leg edema       Assessment/Plan:    Discussed proceeding with venous thrombectomy today. Continue IV heparin.  Do not stop for proceedure  Brooke Hughes 08/01/2020 10:04 AM --  Vitals:   07/31/20 2313 08/01/20 0530  BP: (!) 144/79 140/89  Pulse: 73 65  Resp: 18 16  Temp: 98.5 F (36.9 C) 98.9 F (37.2 C)  SpO2: 95% 96%    Intake/Output Summary (Last 24 hours) at 08/01/2020 1004 Last data filed at 07/31/2020 1516 Gross per 24 hour  Intake 1317.43 ml  Output -  Net 1317.43 ml     Laboratory CBC    Component Value Date/Time   WBC 6.7 08/01/2020 0103   HGB 8.9 (L) 08/01/2020 0103   HCT 27.0 (L) 08/01/2020 0103   PLT 221 08/01/2020 0103    BMET    Component Value Date/Time   NA 137 08/01/2020 0103   K 3.5 08/01/2020 0103   CL 107 08/01/2020 0103   CO2 21 (L) 08/01/2020 0103   GLUCOSE 163 (H) 08/01/2020 0103   BUN 7 08/01/2020 0103   CREATININE 0.75 08/01/2020 0103   CALCIUM 8.7 (L) 08/01/2020 0103   GFRNONAA >60 08/01/2020 0103   GFRAA >60 01/10/2020 1705    COAG No results found for: INR, PROTIME No results found for: PTT  Antibiotics Anti-infectives (From admission, onward)   None       V. Leia Alf, M.D., [facility] [place] Office: [phone] Pager:  [phone]

## 2020-08-02 ENCOUNTER — Encounter (HOSPITAL_COMMUNITY): Payer: Self-pay | Admitting: Vascular Surgery

## 2020-08-02 ENCOUNTER — Telehealth: Payer: Self-pay

## 2020-08-02 ENCOUNTER — Other Ambulatory Visit: Payer: Self-pay | Admitting: Student

## 2020-08-02 ENCOUNTER — Encounter (HOSPITAL_COMMUNITY): Payer: Self-pay | Admitting: Pharmacist Clinician (PhC)/ Clinical Pharmacy Specialist

## 2020-08-02 DIAGNOSIS — E041 Nontoxic single thyroid nodule: Secondary | ICD-10-CM | POA: Diagnosis present

## 2020-08-02 DIAGNOSIS — E119 Type 2 diabetes mellitus without complications: Secondary | ICD-10-CM

## 2020-08-02 DIAGNOSIS — D509 Iron deficiency anemia, unspecified: Secondary | ICD-10-CM | POA: Diagnosis present

## 2020-08-02 LAB — CBC
HCT: 26.5 % — ABNORMAL LOW (ref 36.0–46.0)
Hemoglobin: 8.1 g/dL — ABNORMAL LOW (ref 12.0–15.0)
MCH: 23 pg — ABNORMAL LOW (ref 26.0–34.0)
MCHC: 30.6 g/dL (ref 30.0–36.0)
MCV: 75.3 fL — ABNORMAL LOW (ref 80.0–100.0)
Platelets: 214 10*3/uL (ref 150–400)
RBC: 3.52 MIL/uL — ABNORMAL LOW (ref 3.87–5.11)
RDW: 17.1 % — ABNORMAL HIGH (ref 11.5–15.5)
WBC: 8.1 10*3/uL (ref 4.0–10.5)
nRBC: 0 % (ref 0.0–0.2)

## 2020-08-02 LAB — GLUCOSE, CAPILLARY
Glucose-Capillary: 143 mg/dL — ABNORMAL HIGH (ref 70–99)
Glucose-Capillary: 197 mg/dL — ABNORMAL HIGH (ref 70–99)

## 2020-08-02 LAB — HEPARIN LEVEL (UNFRACTIONATED): Heparin Unfractionated: 0.98 IU/mL — ABNORMAL HIGH (ref 0.30–0.70)

## 2020-08-02 MED ORDER — APIXABAN 5 MG PO TABS: ORAL_TABLET | ORAL | 0 refills | Status: DC

## 2020-08-02 MED ORDER — LISINOPRIL 10 MG PO TABS: 10.0000 mg | ORAL_TABLET | Freq: Every day | ORAL | 0 refills | Status: DC

## 2020-08-02 MED ORDER — SENNOSIDES-DOCUSATE SODIUM 8.6-50 MG PO TABS: 1.0000 | ORAL_TABLET | Freq: Every day | ORAL | 0 refills | Status: DC

## 2020-08-02 MED ORDER — APIXABAN 5 MG PO TABS
10.0000 mg | ORAL_TABLET | Freq: Two times a day (BID) | ORAL | Status: DC
  Administered 2020-08-02: 10 mg via ORAL
  Filled 2020-08-02: qty 2

## 2020-08-02 MED ORDER — APIXABAN 5 MG PO TABS: 5.0000 mg | ORAL_TABLET | Freq: Two times a day (BID) | ORAL | Status: DC

## 2020-08-02 MED ORDER — METFORMIN HCL 500 MG PO TABS: 500.0000 mg | ORAL_TABLET | Freq: Two times a day (BID) | ORAL | 0 refills | Status: DC

## 2020-08-02 MED ORDER — POLYSACCHARIDE IRON COMPLEX 150 MG PO CAPS: 150.0000 mg | ORAL_CAPSULE | ORAL | 0 refills | Status: DC

## 2020-08-02 MED FILL — SENEXON-S 8.6-50 MG TABS: 8.6-50 | 30 days supply | Qty: 30 | Fill #0

## 2020-08-02 MED FILL — ELIQUIS 5 MG TABLET: 5 | 30 days supply | Qty: 64 | Fill #0

## 2020-08-02 MED FILL — POLY-IRON 150 FORTE CAPSULE: 150-25-1 | 30 days supply | Qty: 30 | Fill #0

## 2020-08-02 MED FILL — LISINOPRIL 10 MG TABS: 10 | 30 days supply | Qty: 30 | Fill #0

## 2020-08-02 MED FILL — metFORMIN HCL 500 MG TABS: 500 | 30 days supply | Qty: 60 | Fill #0

## 2020-08-02 MED FILL — Heparin Sod (Porcine)-NaCl IV Soln 1000 Unit/500ML-0.9%: INTRAVENOUS | Qty: 1000 | Status: AC

## 2020-08-02 NOTE — Discharge Summary (Addendum)
Name: Brooke Hughes MRN: [id number] DOB: [birth year] 50 y.o. PCP: Patient, No Pcp Per  Date of Admission: 07/29/2020 11:14 AM Date of Discharge:  08/02/2020 Attending Physician: Velna Ochs, MD  Subjective: Patient evaluated at bedside this AM. She says she is doing well, only some stomach discomfort that started yesterday. She does report 2 BM yesterday that were normal for her, no blood. Otherwise her pain is better controlled and she was able to get up to go to the bathroom. Explained she will need blood thinner for the next 30 days and to [facility] with [facility].  Discharge Diagnosis: 1. 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 Type 2 Diabetes Mellitus 3. Iron deficiency anemia 4. Thyroid nodule 5. Hypertension  Discharge Medications: Allergies as of 08/02/2020       Reactions   Vicodin [hydrocodone-acetaminophen] Hives   Penicillins Hives, Rash        Medication List     TAKE these medications    apixaban 5 MG Tabs tablet Commonly known as: ELIQUIS Take two tablets twice daily starting February 17th. Continue for two days afterward. Then take one tablet in AM and one tablet in PM daily.   iron polysaccharides 150 MG capsule Commonly known as: NIFEREX Take 1 capsule (150 mg total) by mouth every other day. Start taking on: August 03, 2020   lisinopril 10 MG tablet Commonly known as: ZESTRIL Take 1 tablet (10 mg total) by mouth daily. Start taking on: August 03, 2020   metFORMIN 500 MG tablet Commonly known as: GLUCOPHAGE Take 1 tablet (500 mg total) by mouth 2 (two) times daily.   multivitamin with minerals Tabs tablet Take 1 tablet by mouth daily.   ondansetron 8 MG disintegrating tablet Commonly known as: Zofran ODT Take 1 tablet (8 mg total) by mouth every 8 (eight) hours as needed for nausea or vomiting.   senna-docusate 8.6-50 MG tablet Commonly known as: Senokot-S Take 1 tablet by mouth  daily. Start taking on: August 03, 2020               Durable Medical Equipment  (From admission, onward)           Start     Ordered   08/02/20 1122  For home use only DME [place] Rile  Once       Question:  Patient needs a walker to treat with the following condition  Answer:  DVT (deep venous thrombosis) ([place])   08/02/20 1121   07/31/20 1652  For home use only DME 3 n 1  Once        07/31/20 1651            Disposition and follow-up:   Brooke Hughes was discharged from [facility] in Stable condition.  At the hospital follow up visit please address:  1. 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 Sat in the car for the entire day.  Underwent percutaneous thrombectomy on 2/16 secondary extensive clot burden involving the common iliac, external iliac, common femoral, superficial femoral and popliteal veins. Underwent angioplasty and stenting (Wallstent) of the left common and external iliac veins. Patient to be discharged on Eliquis 10mg  BID x3d followed by 5mg  BID afterward. Patient has appointment [facility] and will follow-up with vascular surgery. Please also ensure use of compression stockings. She will also need hypercoagulable  work-up in 4-6 weeks.   2. Type 2 Diabetes Mellitus (uncontrolled). A1c 9.6 on admission. Patient has poor health literacy. Due to this and need for additional medications related to the DVT, she was encouraged to restart her home dose of metformin 500mg  twice daily and lisinopril 10mg  daily. Discussed with her importance of glycemic control and CV risks. She will likely need to set up appointment with diabetes educator and financial counselor in the clinic.  3. Iron deficiency anemia: Iron studies consistent with IDA. Given IV iron dextran while inpatient and started on iron polysaccharide complex. Patient  did have some abdominal discomfort, so switched to oral iron every other day. Please assess for further adverse effects. In addition, it was noted she had fibroid on CT and reports heavy periods. She likely will need OBGYN referral.  4. Thyroid nodule: Incidental finding on CT. TSH normal on admission. Recommend follow-up thyroid ultrasound for thorough evaluation.  5. Hypertension: Patient with previous history of hypertension, on lisinopril at home. Continue to monitor and adjust as needed given her multiple risk factor for adverse CV events.  *Of note, patient will need age-appropriate cancer screenings, as she has not had PCP and denies prior mammogram, colonoscopy, pap smear.  Labs / imaging needed at time of follow-up: CBC, BMP, thyroid ultrasound  Pending labs/ test needing follow-up: none  Follow-up Appointments:  Follow-up Information     [facility] Follow up.   Specialty: Rehabilitation Contact information: [address] [id number] mc [address] [place]        Marty Heck, MD Follow up.   Specialty: Vascular Surgery Why: Office will call you with follow-up appointment Contact information: [place] [address] [place]. Go on 08/07/2020.   Why: You have a scheduled appointment with our clinic on Tuesday, August 07, 2020 at 2:15PM. If you are not able to make this time, please call the clinic to re-schedule. You will need to be seen next week for follow-up. Contact information: [address] [place] [facility] Course by problem list: 1. 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 Lower extremity duplex ultrasound and then venogram revealed extensive clot burden involving common iliac, external iliac,  common femoral, superficial femoral and popliteal veins. She was placed on IV heparin. Underwent thrombectomy of the above veins and angioplasty and stenting (Wallstent) of the left common and external iliac veins on 2/16 by Dr. Carlis Abbott after which time she experienced pain relief.  It was noted by Dr. Carlis Abbott that her vascular anatomy was consistent with May Thurner syndrome which was likely the provoking factor. Patient also has other risk factors, including immobility due to her job as a Geophysicist/field seismologist and obesity. She also has not had age appropriate cancer screenings. After completion of thrombectomy, she was transitioned to Eliquis. She will complete loading dose for the first three days then continue with maintenance dose. She has follow-up appointment with [facility] next week.  2. Type 2 diabetes mellitus: A1c 9.6 on admission. Patient overall has poor health literacy. When discussing her blood sugars, attempted to explain risks of uncontrolled long-standing blood sugars, including increased risk for heart attacks and strokes. Patient will likely need further diabetic education with [facility]. She is to  continue her home dose of metformin at this time and will likely need further titration.   3. Iron deficiency anemia: Iron studies on admission consistent with IDA. She received two doses IV iron dextran and was subsequently started on iron polysaccharide complex. She did develop some abdominal discomfort and constipation, which resolved. She was transitioned to every other day iron supplementation. Will need re-evaluation for further adverse effects. In addition, she will need follow-up with OBGYN for fibroids and history of heavy periods.  4. Thyroid nodule: On CT imaging on arrival, incidental finding of thyroid nodule. TSH normal. Will need further evaluation with thyroid ultrasound as outpatient. Of note, she also has not had other age-appropriate cancer screenings given she has not had previous PCP. Will  follow-up with [facility].  5. Hypertension: Patient with previous history of hypertension on lisinopril at home. BP remained stable throughout hospitalization, will follow-up with [facility] next week.  Discharge Exam:   BP (!) 155/91 (BP Location: Right Arm)   Pulse 76   Temp 98.5 F (36.9 C) (Oral)   Resp 20   Ht 6\' 2"  (1.88 m)   Wt (!) 145.2 kg   SpO2 97%   BMI 41.10 kg/m  General: No acute distress, sitting up in chair HENT: Normocephalic, atraumatic. CV: Regular rate, rhythm. No murmurs, rubs, gallops. Pulm: Clear to auscultation bilaterally. No wheezing, rhonchi, rales. Abd: Soft, non-distended. Mild discomfort with palpation lower abdomen bilaterally. Neuro: Awake, alert, oriented x4. Moving extremities appropriately. MSK: L leg edematous, less than previous days  Pertinent Labs, Studies, and Procedures:  CBC Latest Ref Rng & Units 08/02/2020 08/01/2020 07/31/2020  WBC 4.0 - 10.5 K/uL 8.1 6.7 6.1  Hemoglobin 12.0 - 15.0 g/dL 8.1(L) 8.9(L) 9.1(L)  Hematocrit 36.0 - 46.0 % 26.5(L) 27.0(L) 29.6(L)  Platelets 150 - 400 K/uL 214 221 218   BMP Latest Ref Rng & Units 08/01/2020 07/31/2020 07/30/2020  Glucose 70 - 99 mg/dL 163(H) 206(H) 199(H)  BUN 6 - 20 mg/dL 7 10 7   Creatinine 0.44 - 1.00 mg/dL 0.75 0.79 0.75  Sodium 135 - 145 mmol/L 137 135 138  Potassium 3.5 - 5.1 mmol/L 3.5 3.5 3.7  Chloride 98 - 111 mmol/L 107 104 106  CO2 22 - 32 mmol/L 21(L) 23 25  Calcium 8.9 - 10.3 mg/dL 8.7(L) 8.9 8.9   A1c 9.6  LE ultrasound 2/13 > Findings consistent with acute DVT involving left common femoral vein, left femoral vein, left proximal profunda vein, left popliteal vein, left posterior tibial veins.  CTA chest 2/13 > No evidence of an acute pulmonary embolism. Thyroid mildly enlarged with at least 1.4cm hypoattenuating nodule. Recommend thyroid ultrasound.  CT abdomen/pelvis, CT venogram extremities 2/13 > Acute DVT extends from left external iliac vein through common femoral vein into femoral  vein. Nonocclusive thrombus noted in left popliteal vein. No other evidence of acute DVT.  Thrombectomy 2/17 > Per Dr. Carlis Abbott: IVUS confirmed both acute and chronic thrombus in the left popliteal, superficial femoral, common femoral and profunda vein as well as the external iliac and distal common iliac vein.  The IVC was free of thrombus.  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  The Innari ClotTreiverer was used with a total of 7 passes retrieving mostly chronic and some acute thrombus.  This look much better on IVUS and then we primarily stented the left common and external iliac vein with a 18 mm x  90 mm Wallstent postdilated with a 16 mm Atlas balloon.  Anagrams showed widely patent left iliac stent with excellent flow and no residual stenosis.  Discharge Instructions: Discharge Instructions     Ambulatory referral to Physical Therapy   Complete by: As directed    Iontophoresis - 4 mg/ml of dexamethasone: No   T.E.N.S. Unit Evaluation and Dispense as Indicated: No   Call MD for:   Complete by: As directed    Leg swelling, leg pain   Call MD for:  difficulty breathing, headache or visual disturbances   Complete by: As directed    Call MD for:  extreme fatigue   Complete by: As directed    Call MD for:  hives   Complete by: As directed    Call MD for:  persistant dizziness or light-headedness   Complete by: As directed    Call MD for:  persistant nausea and vomiting   Complete by: As directed    Call MD for:  redness, tenderness, or signs of infection (pain, swelling, redness, odor or green/yellow discharge around incision site)   Complete by: As directed    Call MD for:  severe uncontrolled pain   Complete by: As directed    Call MD for:  temperature >100.4   Complete by: As directed    Diet - low sodium heart healthy   Complete by: As directed    Discharge instructions   Complete by: As  directed    Brooke Hughes, I am so glad you are feeling better! You were admitted to the hospital because of a blood clot in your left leg. The vascular surgeons were able to perform a procedure to remove the clot successfully. You will continue on the blood thinner and follow-up with our clinic. You are now stable and ready for discharge! Please see the following notes:  -It is very important that you take your blood thinner as prescribed. Please take two tablets of Eliquis tonight, February 17th, 2022. You will take two tablets in the morning and two tablets at night for the next two days. Starting on Sunday, February 20th, 2022, you will take one tablet in the morning and one tablet in the evening. It is very important you continue to take this to prevent further clots.  -While you were in the hospital, you were found to have iron deficiency. We gave you IV iron and started you on an iron supplement pill. These can cause constipation and stomach issues. Therefore, we want you to take this pill every other day.   -In addition, we would like for you to follow-up with [place] in the clinic. Moving forward, it is very important to control your blood sugars and high blood pressure. You are at an increased risk of strokes and heart attacks if these are left untreated. We also will need to continue checking your thyroid as well as refer your to an OBGYN for your heavy periods.  -You have an appointment with the [facility] on Tuesday, February 22, at 2:15PM. Please arrive at least 15 minutes early. You also will need to follow-up with the vascular surgeons. Their office will call you to set up an appointment.  It was a pleasure meeting you, Brooke Hughes. I hope you stay happy and healthy!  Thank you, Sanjuan Dame, MD   Increase activity slowly   Complete by: As directed       Signed: Sanjuan Dame, MD 08/02/2020, 11:23 AM   Pager: [phone]

## 2020-08-02 NOTE — Progress Notes (Signed)
Physical Therapy Treatment Patient Details Name: Brooke Hughes MRN: [id number] DOB: 1970-12-25 Today's Date: 08/02/2020    History of Present Illness Pt is a 50 y.o. female who reported to the ED with DOE, SOB, and L>R LE edema. 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 In ED, pt was diagnosed with L DVT in external iliac vein. PE was ruled out. Pt planned to undergo mechanical thrombectomy 07/31/20. PMH is signficant for DM, HTN, Covid in 2020, 2 premature births, and obesity (BMI = 41).    PT Comments    Pt received in chair, willing to participate in PT and hoping to return home today. Supervision for transfers and min guard for ambulation. Able to complete standing marches x16 with R hand on counter to simulate stairs at home. Deferred stairs in stairwell due to medical concerns related to supratherapeutic heparin level. Avoided steps due to possible hemarthrosis and possible excessive force on joints. No overt LOB with mobility, but pt demonstrated decreased awareness of surroundings. Educated patient on safe stair navigation at home and pt verbalized understanding. Would benefit from RW for pain management, stability, and balance. Recommend OP PT to address strength, endurance, and balance. Pt agreeable to OP PT. Pt left in chair with all needs met, call bell within reach, and NT present.      Follow Up Recommendations  Outpatient PT (Limited by insurance situation.)     Equipment Recommendations  3in1 (PT)    Recommendations for Other Services       Precautions / Restrictions Precautions Precautions: Fall Restrictions Weight Bearing Restrictions: No    Mobility  Bed Mobility               General bed mobility comments: Pt received sitting in chair    Transfers Overall transfer level: Needs assistance Equipment used: None Transfers: Sit to/from Stand Sit to Stand: Supervision         General transfer comment: No LOB, minimal  dizziness that went away after standing for 1 minute  Ambulation/Gait Ambulation/Gait assistance: Min guard Gait Distance (Feet): 60 Feet Assistive device: None Gait Pattern/deviations: Step-through pattern;Decreased step length - right;Decreased step length - left;Decreased weight shift to left     General Gait Details: No overt LOB but pt moving fast and unaware of surroundings   Stairs             Wheelchair Mobility    Modified Rankin (Stroke Patients Only)       Balance Overall balance assessment: Needs assistance Sitting-balance support: Feet supported;No upper extremity supported Sitting balance-Leahy Scale: Good       Standing balance-Leahy Scale: Good                              Cognition Arousal/Alertness: Awake/alert Behavior During Therapy: WFL for tasks assessed/performed Overall Cognitive Status: No family/caregiver present to determine baseline cognitive functioning                                        Exercises      General Comments General comments (skin integrity, edema, etc.): Able to do standing x16 marches on each side with R hand on counter with no overt LOB, able to walk to bathroom and sink min guard, no assistance needed to wash face at sink  Pertinent Vitals/Pain Pain Assessment: 0-10 Pain Score: 7  Pain Location: L LE Pain Descriptors / Indicators: Tightness;Pressure;Aching Pain Intervention(s): Monitored during session    Home Living                      Prior Function            PT Goals (current goals can now be found in the care plan section) Acute Rehab PT Goals Patient Stated Goal: exercise more, get stronger    Frequency    Min 4X/week (Maximize PT services due to pt's insurance status)      PT Plan Current plan remains appropriate    Co-evaluation              AM-PAC PT "6 Clicks" Mobility   Outcome Measure  Help needed turning from your back to your  side while in a flat bed without using bedrails?: None Help needed moving from lying on your back to sitting on the side of a flat bed without using bedrails?: None Help needed moving to and from a bed to a chair (including a wheelchair)?: None Help needed standing up from a chair using your arms (e.g., wheelchair or bedside chair)?: None Help needed to walk in hospital room?: A Little Help needed climbing 3-5 steps with a railing? : A Little 6 Click Score: 22    End of Session Equipment Utilized During Treatment: Gait belt Activity Tolerance: Patient tolerated treatment well Patient left: with call bell/phone within reach;in chair Nurse Communication: Mobility status PT Visit Diagnosis: Unsteadiness on feet (R26.81);Muscle weakness (generalized) (M62.81);Pain Pain - Right/Left: Left Pain - part of body: Leg     Time:  -     Charges:                       Anne Gallagher, SPT   

## 2020-08-02 NOTE — Progress Notes (Signed)
ANTICOAGULATION CONSULT NOTE - Follow Up Consult  Pharmacy Consult for Heparin>>apixaban Indication: DVT  Allergies  Allergen Reactions  . Vicodin [Hydrocodone-Acetaminophen] Hives  . Penicillins Hives and Rash    Patient Measurements: Height: 6\' 2"  (188 cm) Weight: (!) 145.2 kg (320 lb 1.7 oz) IBW/kg (Calculated) : 77.7 Heparin Dosing Weight: 111.5 kg  Vital Signs: Temp: 99 F (37.2 C) (02/17 0447) Temp Source: Oral (02/17 0447) BP: 147/99 (02/17 0858) Pulse Rate: 75 (02/17 0447)  Labs: Recent Labs    07/31/20 0156 08/01/20 0103 08/02/20 0258  HGB 9.1* 8.9* 8.1*  HCT 29.6* 27.0* 26.5*  PLT 218 221 214  HEPARINUNFRC 0.41 0.53 0.98*  CREATININE 0.79 0.75  --     Estimated Creatinine Clearance: 143.7 mL/min (by C-G formula based on SCr of 0.75 mg/dL).  Assessment: 50 yr old female is on heparin for extensive LLE DVT. Pt is S/P thrombectomy this afternoon. Pt has had therapeutic heparin levels on heparin infusion at 2400 units/hr (last level was 0.53 units/ml earlier today). H/H 8.9/27.0, plt 221.  Heparin infusion was continued during the procedure this afternoon. Per RN, no issues with IV or bleeding observed post procedure.  Ok to transition to PO apixaban today per [facility]VS and Dr. Collene Gobble. She has gotten 4 days of IV anticoagulation. We will do a 3 days load then maintenance therapy.  Hgb 8.1 Plt wnl  Goal of Therapy:  Monitor platelets by anticoagulation protocol: Yes   Plan:  Apixaban 10mg  PO BID x3 days then 5mg  BID Rx will follow peripherally  Onnie Boer, PharmD, BCIDP, AAHIVP, CPP Infectious Disease Pharmacist 08/02/2020 9:50 AM

## 2020-08-02 NOTE — Progress Notes (Addendum)
Progress Note    08/02/2020 8:13 AM 1 Day Post-Op  Subjective:  Moderate LLE pain when moving about   Vitals:   08/01/20 2346 08/02/20 0447  BP: 131/75 134/78  Pulse: 78 75  Resp: 17 18  Temp: 97.8 F (36.6 C) 99 F (37.2 C)  SpO2: 98% 97%    Physical Exam: General appearance: Awake, alert in no apparent distress Cardiac: Heart rate and rhythm are regular Respirations: Nonlabored Extremities: Left foot is warm with intact sensation and motor function.  2+ palpable DP pulse. Moderate edema with soft compartments. Popliteal tender to palpation but soft. Dressing dry.   CBC    Component Value Date/Time   WBC 8.1 08/02/2020 0258   RBC 3.52 (L) 08/02/2020 0258   HGB 8.1 (L) 08/02/2020 0258   HCT 26.5 (L) 08/02/2020 0258   PLT 214 08/02/2020 0258   MCV 75.3 (L) 08/02/2020 0258   MCH 23.0 (L) 08/02/2020 0258   MCHC 30.6 08/02/2020 0258   RDW 17.1 (H) 08/02/2020 0258   LYMPHSABS 1.2 05/02/2019 0146   MONOABS 0.7 05/02/2019 0146   EOSABS 0.0 05/02/2019 0146   BASOSABS 0.0 05/02/2019 0146    BMET    Component Value Date/Time   NA 137 08/01/2020 0103   K 3.5 08/01/2020 0103   CL 107 08/01/2020 0103   CO2 21 (L) 08/01/2020 0103   GLUCOSE 163 (H) 08/01/2020 0103   BUN 7 08/01/2020 0103   CREATININE 0.75 08/01/2020 0103   CALCIUM 8.7 (L) 08/01/2020 0103   GFRNONAA >60 08/01/2020 0103   GFRAA >60 01/10/2020 1705     Intake/Output Summary (Last 24 hours) at 08/02/2020 0813 Last data filed at 08/01/2020 2029 Gross per 24 hour  Intake 3 ml  Output -  Net 3 ml    HOSPITAL MEDICATIONS Scheduled Meds: . aspirin EC  81 mg Oral Daily  . insulin aspart  0-20 Units Subcutaneous TID WC  . iron polysaccharides  150 mg Oral Daily  . lisinopril  10 mg Oral Daily  . senna-docusate  1 tablet Oral Daily  . sodium chloride flush  3 mL Intravenous Q12H   Continuous Infusions: . sodium chloride 100 mL/hr at 07/31/20 0859  . sodium chloride    . heparin 2,100 Units/hr  (08/02/20 0432)   PRN Meds:.sodium chloride, acetaminophen **OR** acetaminophen, hydrALAZINE, labetalol, ondansetron (ZOFRAN) IV, oxyCODONE, polyethylene glycol, sodium chloride flush  Assessment and Plan: POD #1  percutaneous mechanical thrombectomy of left popliteal vein, superficial femoral, common femoral, external iliac and common iliac vein (Innari ClotTreiver) and angioplasty and stent of left common and external iliac vein (18 mm x 90 mm Wallstent) postdilated with a 16 mm Atlas.  OK to begin DOAC. Continue LLE elevation  Will plan follow-up in one month with venous duplex.  -DVT prophylaxis:  Heparin infusion   [name], PA-C [facility] [phone] 08/02/2020  8:13 AM    I agree with the above.  I have seen and evaluated the patient.  She underwent mechanical thrombectomy yesterday and subsequent stenting for a left leg DVT with significant symptoms she states that she has less pressure in her leg today and that it feels better.  She did not have any bleeding through her dressing overnight.  She will need to be fitted for 20-30 thigh-high compression stockings.  She will need to be discharged home on a DOAC.  She will follow-up in our office in approximately 1 month.  She is cleared for discharge from a vascular  perspective  Brooke Hughes

## 2020-08-02 NOTE — Discharge Instructions (Signed)
Deep Vein Thrombosis  Deep vein thrombosis (DVT) is a condition in which a blood clot forms in a deep vein, such as a vein in the lower leg, thigh, pelvis, or arm. Deep veins are veins in the deep venous system. A clot is blood that has thickened into a gel or solid. This condition is serious and can be life-threatening if the clot travels to the lungs and causes a blockage (pulmonary embolism) in the arteries of the lung. A DVT can also damage veins in the leg. This can lead to long-term, or chronic, venous disease, leg pain, swelling, discoloration, and ulcers or sores (post-thrombotic syndrome). What are the causes? This condition may be caused by:  A slowdown of blood flow.  Damage to a vein.  A condition that causes blood to clot more easily, such as certain blood-clotting disorders. What increases the risk? The following factors may make you more likely to develop this condition:  Having obesity.  Being older, especially older than age 60.  Being inactive (sedentary lifestyle) or not moving around. This may include: ? Sitting or lying down for longer than 4-6 hours other than to sleep at night. ? Being in the hospital, having major or lengthy surgery, or having a thin, flexible tube (central line catheter) placed in a large vein.  Being pregnant, giving birth, or having recently given birth.  Taking medicines that contain estrogen, such as birth control or hormone replacement therapy.  Using products that contain nicotine or tobacco, especially if you use hormonal birth control.  Having a history of blood clots or a blood-clotting disease, a blood vessel disease (peripheral vascular disease), or congestive heart disease.  Having a history of cancer, especially if being treated with chemotherapy. What are the signs or symptoms? Symptoms of this condition include:  Swelling, pain, pressure, or tenderness in an arm or a leg.  An arm or a leg becoming warm, red, or  discolored.  A leg turning very pale. You may have a large DVT. This is rare. If the clot is in your leg, you may notice symptoms more or have worse symptoms when you stand or walk. In some cases, there are no symptoms. How is this diagnosed? This condition is diagnosed with:  Your medical history and a physical exam.  Tests, such as: ? Blood tests to check how well your blood clots. ? Doppler ultrasound. This is the best way to find a DVT. ? Venogram. Contrast dye is injected into a vein, and X-rays are taken to check for clots. How is this treated? Treatment for this condition depends on:  The cause of your DVT.  The size and location of your DVT, or having more than one DVT.  Your risk for bleeding or developing more clots.  Other medical conditions you may have. Treatment may include:  Taking a blood thinner, also called an anticoagulant, to prevent clots from forming and growing.  Wearing compression stockings, if directed.  Injecting medicines into the affected vein to break up the clot (catheter-directed thrombolysis). This is used only for severe DVT and only if a specialist recommends it.  Specific surgical procedures, when DVT is severe or hard to treat. These may be done to: ? Isolate and remove your clot. ? Place an inferior vena cava (IVC) filter in a large vein to catch blood clots before they reach your lungs. You may get some medical treatments for 6 months or longer. Follow these instructions at home: If you are taking blood thinners:    Talk with your health care provider before you take any medicines that contain aspirin or NSAIDs, such as ibuprofen. These medicines increase your risk for dangerous bleeding.  Take your medicine exactly as told, at the same time every day. Do not skip a dose. Do not take more than the prescribed dose. This is important.  Ask your health care provider about foods and medicines that could change the way your blood thinner  works (may interact). Avoid these foods and medicines if you are told to do so.  Avoid anything that may cause bleeding or bruising. You may bleed more easily while taking blood thinners. ? Be very careful when using knives, scissors, or other sharp objects. ? Use an electric razor instead of a blade. ? Avoid activities that could cause injury or bruising, and follow instructions for preventing falls. ? Tell your health care provider if you have had any internal bleeding, bleeding ulcers, or neurologic diseases, such as strokes or cerebral aneurysms.  Wear a medical alert bracelet or carry a card that lists what medicines you take. General instructions  Take over-the-counter and prescription medicines only as told by your health care provider.  Return to your normal activities as told by your health care provider. Ask your health care provider what activities are safe for you.  If recommended, wear compression stockings as told by your health care provider. These stockings help to prevent blood clots and reduce swelling in your legs.  Keep all follow-up visits as told by your health care provider. This is important. Contact a health care provider if:  You miss a dose of your blood thinner.  You have new or worse pain, swelling, or redness in an arm or a leg.  You have worsening numbness or tingling in an arm or a leg.  You have unusual bruising. Get help right away if:  You have signs or symptoms that a blood clot has moved to the lungs. These may include: ? Shortness of breath. ? Chest pain. ? Fast or irregular heartbeats (palpitations). ? Light-headedness or dizziness. ? Coughing up blood.  You have signs or symptoms that your blood is too thin. These may include: ? Blood in your vomit, stool, or urine. ? A cut that will not stop bleeding. ? A menstrual period that is heavier than usual. ? A severe headache or confusion. These symptoms may represent a serious problem that  is an emergency. Do not wait to see if the symptoms will go away. Get medical help right away. Call your local emergency services (911 in the [place] Do not drive yourself to the hospital. Summary  Deep vein thrombosis (DVT) happens when a blood clot forms in a deep vein. This may occur in the lower leg, thigh, pelvis, or arm.  Symptoms affect the arm or leg and can include swelling, pain, tenderness, warmth, redness, or discoloration.  This condition may be treated with medicines or compression stockings. In severe cases, surgery may be done.  If you are taking blood thinners, take them exactly as told. Do not skip a dose. Do not take more than is prescribed.  Get help right away if you have shortness of breath, chest pain, fast or irregular heartbeats, or blood in your vomit, urine, or stool. This information is not intended to replace advice given to you by your health care provider. Make sure you discuss any questions you have with your health care provider. Document Revised: 05/28/2019 Document Reviewed: 05/28/2019 Elsevier Patient Education  2021 Elsevier   Inc.  

## 2020-08-02 NOTE — Progress Notes (Signed)
Nsg Discharge Note  Patient discharging home. Left with 3in1, walker, and medications.  Admit Date:  07/29/2020 Discharge date: 08/02/2020   Brooke Hughes to be D/C'd Home per MD order.  AVS completed.  Copy for chart, and copy for patient signed, and dated. Patient/caregiver able to verbalize understanding.  Discharge Medication: Allergies as of 08/02/2020      Reactions   Vicodin [hydrocodone-acetaminophen] Hives   Penicillins Hives, Rash      Medication List    TAKE these medications   apixaban 5 MG Tabs tablet Commonly known as: ELIQUIS Take two tablets twice daily starting February 17th. Continue for two days afterward. Then take one tablet in AM and one tablet in PM daily.   iron polysaccharides 150 MG capsule Commonly known as: NIFEREX Take 1 capsule (150 mg total) by mouth every other day. Start taking on: August 03, 2020   lisinopril 10 MG tablet Commonly known as: ZESTRIL Take 1 tablet (10 mg total) by mouth daily. Start taking on: August 03, 2020   metFORMIN 500 MG tablet Commonly known as: GLUCOPHAGE Take 1 tablet (500 mg total) by mouth 2 (two) times daily.   multivitamin with minerals Tabs tablet Take 1 tablet by mouth daily.   ondansetron 8 MG disintegrating tablet Commonly known as: Zofran ODT Take 1 tablet (8 mg total) by mouth every 8 (eight) hours as needed for nausea or vomiting.   senna-docusate 8.6-50 MG tablet Commonly known as: Senokot-S Take 1 tablet by mouth daily. Start taking on: August 03, 2020            Durable Medical Equipment  (From admission, onward)         Start     Ordered   08/02/20 1126  For home use only DME Walker rolling  Once       Question Answer Comment  Walker: With [place] Wheels   Patient needs a walker to treat with the following condition Weakness      08/02/20 1126   08/02/20 1122  For home use only DME Walker  Once       Question:  Patient needs a walker to treat with the following condition   Answer:  DVT (deep venous thrombosis) ([place])   08/02/20 1121   07/31/20 1652  For home use only DME 3 n 1  Once        07/31/20 1651          Discharge Assessment: Vitals:   08/02/20 0858 08/02/20 1104  BP: (!) 147/99 (!) 155/91  Pulse:  76  Resp:  20  Temp:  98.5 F (36.9 C)  SpO2:  97%   Skin clean, dry and intact without evidence of skin break down, no evidence of skin tears noted. IV catheter discontinued intact. Site without signs and symptoms of complications - no redness or edema noted at insertion site, patient denies c/o pain - only slight tenderness at site.  Dressing with slight pressure applied.  D/c Instructions-Education: Discharge instructions given to patient/family with verbalized understanding. D/c education completed with patient/family including follow up instructions, medication list, d/c activities limitations if indicated, with other d/c instructions as indicated by MD - patient able to verbalize understanding, all questions fully answered. Patient instructed to return to ED, call 911, or call MD for any changes in condition.  Patient escorted via [place], and D/C home via private auto.  Erasmo Leventhal, RN 08/02/2020 1:33 PM

## 2020-08-02 NOTE — Progress Notes (Signed)
ANTICOAGULATION CONSULT NOTE - Follow Up Consult  Pharmacy Consult for heparin Indication: DVT  Labs: Recent Labs    07/30/20 0713 07/30/20 2142 07/31/20 0156 08/01/20 0103 08/02/20 0258  HGB 9.3*  --  9.1* 8.9* 8.1*  HCT 30.9*  --  29.6* 27.0* 26.5*  PLT 225  --  218 221 214  HEPARINUNFRC <0.10*   < > 0.41 0.53 0.98*  CREATININE 0.75  --  0.79 0.75  --    < > = values in this interval not displayed.    Assessment: 50yo female supratherapeutic on heparin after several levels at goal; no gtt issues or signs of bleeding per RN; pt went for thrombectomy yesterday and rec'd 17,000 units of heparin boluses during procedure though that has been >12h ago.  Goal of Therapy:  Heparin level 0.3-0.7 units/ml   Plan:  Will decrease heparin gtt by 2 units/kg/hr to 2100 units/hr and check level in 6 hours.    Wynona Neat, PharmD, BCPS  08/02/2020,4:03 AM

## 2020-08-02 NOTE — TOC Progression Note (Addendum)
Transition of Care [facility]) - Progression Note    Patient Details  Name: Brooke Hughes MRN: [id number] Date of Birth: 1970-12-15  [facility] CM/SW Contact  Jacalyn Lefevre Edson Snowball, RN Phone Number: 08/02/2020, 10:40 AM  Clinical Narrative:     See previous note. OP PT already ordered. Called [employer] for 3 in1 . Changed pharmacy to Trans[facility] Care Pharmacy. They can use 30 day free card for Eliquis.  Added rolling walker   Pending other discharge prescriptions, NCM can assist with MATCH.    Patient has follow up appointment at [facility] August 23, 2020 for further assistance and to establish PCP.   Dr Collene Gobble will follow patient post discharge in the [facility]. NCM called [facility], spoke to Leonore and cancelled appointment   Expected Discharge Plan: Home/Self Care Barriers to Discharge: Continued Medical Work up  Expected Discharge Plan and Services Expected Discharge Plan: Home/Self Care In-house Referral: Development worker, community Discharge Planning Services: [place]   Living arrangements for the past 2 months: Single Family Home                 DME Arranged: N/A         HH Arranged: NA           Social Determinants of Health (SDOH) Interventions    Readmission Risk Interventions No flowsheet data found.

## 2020-08-02 NOTE — Telephone Encounter (Signed)
Hospital TOC per Dr. Collene Gobble, discharge 08/02/2020, appt 08/07/2020.

## 2020-08-07 ENCOUNTER — Other Ambulatory Visit (HOSPITAL_COMMUNITY): Payer: Self-pay | Admitting: Internal Medicine

## 2020-08-07 ENCOUNTER — Encounter: Payer: Self-pay | Admitting: Internal Medicine

## 2020-08-07 ENCOUNTER — Other Ambulatory Visit: Payer: Self-pay

## 2020-08-07 ENCOUNTER — Ambulatory Visit (INDEPENDENT_AMBULATORY_CARE_PROVIDER_SITE_OTHER): Payer: Self-pay | Admitting: Internal Medicine

## 2020-08-07 VITALS — BP 146/77 | HR 72 | Temp 98.0°F | Ht 72.0 in | Wt 345.4 lb

## 2020-08-07 DIAGNOSIS — D219 Benign neoplasm of connective and other soft tissue, unspecified: Secondary | ICD-10-CM

## 2020-08-07 DIAGNOSIS — Z0000 Encounter for general adult medical examination without abnormal findings: Secondary | ICD-10-CM | POA: Insufficient documentation

## 2020-08-07 DIAGNOSIS — I10 Essential (primary) hypertension: Secondary | ICD-10-CM | POA: Insufficient documentation

## 2020-08-07 DIAGNOSIS — E041 Nontoxic single thyroid nodule: Secondary | ICD-10-CM

## 2020-08-07 DIAGNOSIS — D500 Iron deficiency anemia secondary to blood loss (chronic): Secondary | ICD-10-CM

## 2020-08-07 DIAGNOSIS — I824Y2 Acute embolism and thrombosis of unspecified deep veins of left proximal lower extremity: Secondary | ICD-10-CM

## 2020-08-07 DIAGNOSIS — E1165 Type 2 diabetes mellitus with hyperglycemia: Secondary | ICD-10-CM

## 2020-08-07 MED ORDER — APIXABAN 5 MG PO TABS
5.0000 mg | ORAL_TABLET | Freq: Two times a day (BID) | ORAL | 3 refills | Status: DC
Start: 2020-08-07 — End: 2022-05-01

## 2020-08-07 MED ORDER — METFORMIN HCL 1000 MG PO TABS
1000.0000 mg | ORAL_TABLET | Freq: Two times a day (BID) | ORAL | 3 refills | Status: DC
Start: 2020-08-07 — End: 2022-05-01

## 2020-08-07 MED ORDER — LISINOPRIL 20 MG PO TABS: 20.0000 mg | ORAL_TABLET | Freq: Every day | ORAL | 3 refills | Status: DC

## 2020-08-07 MED ORDER — CANAGLIFLOZIN 100 MG PO TABS: 100.0000 mg | ORAL_TABLET | Freq: Every day | ORAL | 3 refills | Status: DC

## 2020-08-07 MED FILL — INVOKANA 100 MG TABLET: 100 | 30 days supply | Qty: 30 | Fill #0

## 2020-08-07 MED FILL — LISINOPRIL 20 MG TABLET: 20 | 30 days supply | Qty: 30 | Fill #0

## 2020-08-07 MED FILL — METFORMIN HCL 1000 MG TABS: 1000 | 30 days supply | Qty: 60 | Fill #0

## 2020-08-07 NOTE — Progress Notes (Signed)
   CC: Follow-up DVT, establish care  HPI:  [name] is a 50 y.o. 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  Please see problem based charting for further details.  Past Medical History:  Diagnosis Date  . Diabetes mellitus without complication ([place])   . Hypertension   . Obesity     Review of Systems:   Review of Systems  Constitutional: Negative.   HENT: Negative.   Eyes: Negative.   Respiratory: Negative.   Cardiovascular: Negative.   Gastrointestinal: Negative.   Genitourinary: Negative.   Musculoskeletal:       Mild pain at the angioplasty insertion site.  Skin: Negative.   Neurological: Negative.   Endo/Heme/Allergies: Negative.   Psychiatric/Behavioral: Negative.     Family history: Mother and father with diabetes.  Mother passed from myocardial infarction at 51.  Brother with CHF Social history: Has 3 children (2 boys and one girl), lives at home.  Denies EtOH, tobacco use or illicit drug use.   Physical Exam:  Vitals:   08/07/20 1433 08/07/20 1437  BP: (!) 157/74 (!) 146/77  Pulse: 72   Temp: 98 F (36.7 C)   TempSrc: Oral   SpO2: 100%   Weight: (!) 345 lb 6.4 oz (156.7 kg)   Height: 6' (1.829 m)    Physical Exam Vitals and nursing note reviewed.  Constitutional:      Appearance: She is obese.  HENT:     Head: Normocephalic and atraumatic.  Eyes:     Conjunctiva/sclera: Conjunctivae normal.  Cardiovascular:     Rate and Rhythm: Normal rate.     Heart sounds: Normal heart sounds.  Pulmonary:     Breath sounds: Normal breath sounds.  Abdominal:     General: Bowel sounds are normal.     Tenderness: There is no abdominal tenderness.  Musculoskeletal:        General: Tenderness (Mild  tenderness at the IV angioplasty insertion site) present. No swelling.     Right lower leg: No edema.     Left lower leg: Edema (Non-pitting, wearing compression stockings) present.  Skin:    General: Skin is warm.  Neurological:     Mental Status: She is alert.     Gait: Gait normal.     Assessment & Plan:   See Encounters Tab for problem based charting.  Patient discussed with Dr. Evette Doffing

## 2020-08-07 NOTE — Assessment & Plan Note (Signed)
#  Hypertension: Not at goal.  BP Readings from Last 3 Encounters:  08/07/20 (!) 146/77  08/02/20 (!) 155/91  01/11/20 (!) 161/98    Plan: -Increase lisinopril to 20 mg daily -Return to clinic in 2 to 4 weeks for BMP and hypertension follow-up

## 2020-08-07 NOTE — Patient Instructions (Addendum)
Ms. Parrott,   It was a pleasure taking care of you here in the clinic today.  Here are my recommendations after our visit.  1.  Please continue the Eliquis 5 mg twice daily.  We will continue this for 3 months 2.  For your diabetes, increase Metformin to 1000 mg twice daily.  I have added a medication called Invokana which you will take 100 mg daily 3.  For your blood pressure, increase the lisinopril to 20 mg.  I have given a refill 4.  Please come back to the clinic in 2 to 4 weeks for [place] to check blood work 5.  Please follow-up with the vascular surgeon 6.  I have also ordered a thyroid ultrasound 7.  Please follow-up with the OB/GYN 8.  I will refer you to our financial counselor.   Address to pharmacy:  [facility] [address], [place], [place] [address]  Take care! Dr. Eileen Stanford

## 2020-08-07 NOTE — Assessment & Plan Note (Signed)
#  Health maintenance: Patient is self-pay and will require an appointment with our financial counselor before referring for mammogram and colonoscopy. -Mammogram -Colonoscopy given the new recently revised guidelines to start at 48 years old -Due to body habitus, will have Pap smear performed during her OB/GYN appointment when addressing her fibroids and menorrhagia

## 2020-08-07 NOTE — Assessment & Plan Note (Signed)
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  She was found to have an extensive DVT involving the left, femoral vein, left femoral vein, left proximal profunda vein, left popliteal vein and left posterior tibial vein.  She was initially started on therapeutic IV heparin.  She subsequently underwent angioplasty and stenting (Wallstent) of the left common and external iliac veins and was discharged on Eliquis 5 mg twice daily.  Overall, she states she is doing well and has noticed improvement in her leg swelling and pain.  She is compliant with her compression stockings.  Plan: -Continue Eliquis 5 mg twice daily for at least 3 months (per UTD Anticoagulation is continued per venous thromboembolism (VTE) guidelines.) -Follow-up with vascular surgery -Hypercoagulable work-up next month -Continue compression stockings

## 2020-08-07 NOTE — Assessment & Plan Note (Signed)
#  Type 2 diabetes mellitus: A1c during admission was 9.6%.  She was instructed to restart her home Metformin 500 mg twice daily.  States her home is fasting CBGs are around 150s  Plan: -Increase Metformin to 1000 mg twice daily -Add Invokana 100 mg daily.  (No history of glycemic DKA)

## 2020-08-07 NOTE — Assessment & Plan Note (Signed)
#  Thyroid nodule: This was incidentally found on CT which showed a 1.4 cm hypoattenuating nodule.  TSH was unremarkable.  Plan: -Follow-up thyroid ultrasound

## 2020-08-07 NOTE — Assessment & Plan Note (Signed)
#  Iron deficiency anemia: This is thought to be secondary to fibroids and menorrhagia.  Last menstrual period was last month and reported menorrhagia with intermittent clots.  She received IV iron dextran in the hospital and was started on iron polysaccharide complex.  Plan: -Continue oral iron supplements -Follow-up CBC -Follow-up OB/GYN

## 2020-08-08 LAB — CBC
Hematocrit: 28.6 % — ABNORMAL LOW (ref 34.0–46.6)
Hemoglobin: 8.7 g/dL — ABNORMAL LOW (ref 11.1–15.9)
MCH: 23.3 pg — ABNORMAL LOW (ref 26.6–33.0)
MCHC: 30.4 g/dL — ABNORMAL LOW (ref 31.5–35.7)
MCV: 77 fL — ABNORMAL LOW (ref 79–97)
Platelets: 298 10*3/uL (ref 150–450)
RBC: 3.74 x10E6/uL — ABNORMAL LOW (ref 3.77–5.28)
RDW: 18.2 % — ABNORMAL HIGH (ref 11.7–15.4)
WBC: 5.9 10*3/uL (ref 3.4–10.8)

## 2020-08-09 ENCOUNTER — Telehealth (HOSPITAL_COMMUNITY): Payer: Self-pay

## 2020-08-09 NOTE — Progress Notes (Signed)
Internal Medicine Clinic Attending  Case discussed with Dr. Agyei  At the time of the visit.  We reviewed the resident's history and exam and pertinent patient test results.  I agree with the assessment, diagnosis, and plan of care documented in the resident's note.  

## 2020-08-09 NOTE — Telephone Encounter (Signed)
Pharmacy Transitions of Care Follow-up Telephone Call  Date of discharge: 08/02/20  Discharge Diagnosis: fall/DVT  How have you been since you were released from the hospital? Patient has been well but did have an isolated episode of nausea and diarrhea last night on 08/08/20. She feels beeter this morning. She was instructed to call her PCP if she experiences this again. Knows s/sx of bleeding to look out for  Medication changes made at discharge: yes  Medication changes obtained and verified? yes    Medication Accessibility:  Home Pharmacy: [facility]   Was the patient provided with refills on discharged medications? no  Have all prescriptions been transferred from [facility] to home pharmacy? no  . Is the patient able to afford medications? Patient is seeing IM for meds to be put on IM program    Medication Review:  APIXABEN (ELIQUIS)  Apixaban 10 mg BID initiated on 08/02/20. Will switch to apixaban 5 mg BID after 7 days.  - Discussed importance of taking medication around the same time everyday   - Advised patient of medications to avoid (NSAIDs, ASA)  - Educated that Tylenol (acetaminophen) will be the preferred analgesic to prevent risk of bleeding  - Emphasized importance of monitoring for signs and symptoms of bleeding (abnormal bruising, prolonged bleeding, nose bleeds, bleeding from gums, discolored urine, black tarry stools)  - Advised patient to alert all providers of anticoagulation therapy prior to starting a new medication or having a procedure    Follow-up Appointments:  Patient has follow up with Internal Medicine on 08/23/20 and will receive refills at this visit to be put on IM program.   If their condition worsens, is the pt aware to call PCP or go to the Emergency Dept.? yes  Final Patient Assessment: Patient doing well. Knows s/sx of bleeding top watch out for. Has f/u scheduled and knows to get refill at this visit.

## 2020-08-09 NOTE — Telephone Encounter (Signed)
Pt saw Dr Eileen Stanford on 08/07/20.

## 2020-08-23 ENCOUNTER — Other Ambulatory Visit: Payer: Self-pay

## 2020-08-23 ENCOUNTER — Inpatient Hospital Stay: Payer: Self-pay | Admitting: Physician Assistant

## 2020-08-23 ENCOUNTER — Ambulatory Visit: Payer: Self-pay

## 2020-08-27 ENCOUNTER — Ambulatory Visit: Payer: Self-pay | Admitting: Physical Therapy

## 2020-09-04 ENCOUNTER — Ambulatory Visit: Payer: Self-pay

## 2020-09-04 ENCOUNTER — Other Ambulatory Visit: Payer: Self-pay

## 2020-09-10 ENCOUNTER — Encounter: Payer: Self-pay | Admitting: *Deleted

## 2020-09-20 ENCOUNTER — Ambulatory Visit (HOSPITAL_COMMUNITY): Payer: Self-pay

## 2020-09-25 ENCOUNTER — Ambulatory Visit: Payer: Self-pay | Admitting: Family Medicine

## 2020-10-10 NOTE — Addendum Note (Signed)
Addended by: Hulan Fray on: 10/10/2020 03:49 PM   Modules accepted: Orders

## 2020-11-05 ENCOUNTER — Encounter: Payer: Self-pay | Admitting: Student

## 2021-08-26 ENCOUNTER — Emergency Department (HOSPITAL_COMMUNITY): Payer: Self-pay

## 2021-08-26 ENCOUNTER — Encounter (HOSPITAL_COMMUNITY): Payer: Self-pay

## 2021-08-26 ENCOUNTER — Other Ambulatory Visit: Payer: Self-pay

## 2021-08-26 ENCOUNTER — Emergency Department (HOSPITAL_COMMUNITY)
Admission: EM | Admit: 2021-08-26 | Discharge: 2021-08-26 | Disposition: A | Discharge status: Home / Self Care | Payer: Self-pay | Attending: Emergency Medicine | Admitting: Emergency Medicine

## 2021-08-26 DIAGNOSIS — Z7901 Long term (current) use of anticoagulants: Secondary | ICD-10-CM | POA: Insufficient documentation

## 2021-08-26 DIAGNOSIS — M25522 Pain in left elbow: Secondary | ICD-10-CM

## 2021-08-26 DIAGNOSIS — W19XXXA Unspecified fall, initial encounter: Secondary | ICD-10-CM

## 2021-08-26 DIAGNOSIS — E119 Type 2 diabetes mellitus without complications: Secondary | ICD-10-CM | POA: Insufficient documentation

## 2021-08-26 DIAGNOSIS — S0990XA Unspecified injury of head, initial encounter: Secondary | ICD-10-CM | POA: Insufficient documentation

## 2021-08-26 DIAGNOSIS — Z79899 Other long term (current) drug therapy: Secondary | ICD-10-CM | POA: Insufficient documentation

## 2021-08-26 DIAGNOSIS — W010XXA Fall on same level from slipping, tripping and stumbling without subsequent striking against object, initial encounter: Secondary | ICD-10-CM | POA: Insufficient documentation

## 2021-08-26 DIAGNOSIS — M25562 Pain in left knee: Secondary | ICD-10-CM

## 2021-08-26 DIAGNOSIS — Z7984 Long term (current) use of oral hypoglycemic drugs: Secondary | ICD-10-CM | POA: Insufficient documentation

## 2021-08-26 DIAGNOSIS — I10 Essential (primary) hypertension: Secondary | ICD-10-CM

## 2021-08-26 DIAGNOSIS — R600 Localized edema: Secondary | ICD-10-CM | POA: Insufficient documentation

## 2021-08-26 MED ORDER — ACETAMINOPHEN 500 MG PO TABS
1000.0000 mg | ORAL_TABLET | Freq: Once | ORAL | Status: AC
  Administered 2021-08-26: 1000 mg via ORAL
  Filled 2021-08-26: qty 2

## 2021-08-26 MED ORDER — LISINOPRIL 10 MG PO TABS
20.0000 mg | ORAL_TABLET | Freq: Once | ORAL | Status: AC
  Administered 2021-08-26: 20 mg via ORAL
  Filled 2021-08-26: qty 2

## 2021-08-26 NOTE — ED Triage Notes (Signed)
Patient reports that she tripped on uneven concrete. Patient denies hitting her head.  ?Patient c/o left knee and elbow injury/pain. ?

## 2021-08-26 NOTE — ED Provider Notes (Cosign Needed)
[facility] DEPT Provider Note   CSN: [id number] Arrival date & time: 08/26/21  1204     History  Chief Complaint  Patient presents with   Brooke Hughes is a 51 y.o. female with past medical history of hypertension, diabetes mellitus, obesity, status post right femur fracture.  Presents to the emergency department with a chief complaint of left knee and left elbow pain after suffering a fall.  Patient states that this morning approximately 7 AM Brooke Hughes tripped over uneven concrete and fell to the floor.  Patient endorses landing on her left knee and left elbow.  Patient is unsure if Brooke Hughes hit her head.  Denies any loss of consciousness.  Patient reports that Brooke Hughes has had pain to left elbow and left knee since the injury.  Patient rates her pain 9/10 on the pain scale.  Pain is worse with movement and touch.  Patient has been ambulatory since the accident.  Patient has not tried any modalities to alleviate her pain.  Patient is on blood thinners.  Patient denies any neck pain, back pain, numbness, weakness, saddle anesthesia, bowel/bladder dysfunction, vomiting, visual disturbance.   Fall Pertinent negatives include no chest pain, no headaches and no shortness of breath.      Home Medications Prior to Admission medications   Medication Sig Start Date End Date Taking? Authorizing Provider  apixaban (ELIQUIS) 5 MG TABS tablet Take 1 tablet (5 mg total) by mouth 2 (two) times daily. Take two tablets twice daily starting February 17th. Continue for two days afterward. Then take one tablet in AM and one tablet in PM daily. 08/07/20   Jean Rosenthal, MD  apixaban (ELIQUIS) 5 MG TABS tablet TAKE TWO TABLETS TWICE DAILY STARTING FEBRUARY 17TH. CONTINUE FOR TWO DAYS AFTERWARD. THEN TAKE ONE TABLET IN AM AND ONE TABLET IN PM DAILY 08/02/20 08/02/21  Sanjuan Dame, MD  apixaban (ELIQUIS) 5 MG TABS tablet TAKE 1 TABLET BY MOUTH IN THE AM & 1 TABLET IN THE PM DAILY  08/07/20 08/07/21  Jean Rosenthal, MD  canagliflozin (INVOKANA) 100 MG TABS tablet Take 1 tablet (100 mg total) by mouth daily before breakfast. IM PROGRAM 08/07/20   Jean Rosenthal, MD  iron polysaccharides (NIFEREX) 150 MG capsule Take 1 capsule (150 mg total) by mouth every other day. 08/03/20   Sanjuan Dame, MD  lisinopril (ZESTRIL) 10 MG tablet TAKE 1 TABLET (10 MG TOTAL) BY MOUTH DAILY. 08/02/20 08/02/21  Sanjuan Dame, MD  lisinopril (ZESTRIL) 20 MG tablet Take 1 tablet (20 mg total) by mouth daily. 08/07/20   Agyei, Caprice Kluver, MD  lisinopril (ZESTRIL) 20 MG tablet TAKE 1 TABLET (20 MG TOTAL) BY MOUTH DAILY. 08/07/20 08/07/21  Jean Rosenthal, MD  metFORMIN (GLUCOPHAGE) 1000 MG tablet Take 1 tablet (1,000 mg total) by mouth 2 (two) times daily with a meal. 08/07/20 08/07/21  Agyei, Caprice Kluver, MD  metFORMIN (GLUCOPHAGE) 1000 MG tablet TAKE 1 TABLET (1,000 MG TOTAL) BY MOUTH 2 (TWO) TIMES DAILY WITH A MEAL. 08/07/20 08/07/21  Jean Rosenthal, MD  metFORMIN (GLUCOPHAGE) 500 MG tablet TAKE 1 TABLET (500 MG TOTAL) BY MOUTH TWO TIMES DAILY. 08/02/20 08/02/21  Sanjuan Dame, MD  Multiple Vitamin (MULTIVITAMIN WITH MINERALS) TABS tablet Take 1 tablet by mouth daily.    [provider]  senna-docusate (SENOKOT-S) 8.6-50 MG tablet Take 1 tablet by mouth daily. 08/03/20   Sanjuan Dame, MD      Allergies  Vicodin [hydrocodone-acetaminophen] and Penicillins    Review of Systems   Review of Systems  Constitutional:  Negative for chills and fever.  HENT:  Negative for facial swelling.   Eyes:  Negative for visual disturbance.  Respiratory:  Negative for shortness of breath.   Cardiovascular:  Negative for chest pain.  Gastrointestinal:  Negative for vomiting.  Genitourinary:  Negative for difficulty urinating.  Musculoskeletal:  Positive for arthralgias and myalgias. Negative for back pain and neck pain.  Skin:  Negative for color change, pallor, rash and wound.  Neurological:  Negative for  dizziness, tremors, seizures, syncope, facial asymmetry, speech difficulty, weakness, light-headedness, numbness and headaches.  Psychiatric/Behavioral:  Negative for confusion.    Physical Exam Updated Vital Signs BP (!) 198/118 (BP Location: Left Arm) Comment: Patient states that Brooke Hughes has not taken her BP meds today.   Pulse 82    Temp 98.1 F (36.7 C)    Resp 18    Ht '6\' 2"'$  (1.88 m)    Wt 127 kg    LMP 07/17/2021 (Approximate)    SpO2 96%    BMI 35.95 kg/m  Physical Exam Vitals and nursing note reviewed.  Constitutional:      General: Brooke Hughes is not in acute distress.    Appearance: Brooke Hughes is not ill-appearing, toxic-appearing or diaphoretic.  HENT:     Head: Normocephalic and atraumatic.  Eyes:     General: No scleral icterus.       Right eye: No discharge.        Left eye: No discharge.  Cardiovascular:     Rate and Rhythm: Normal rate.  Pulmonary:     Effort: Pulmonary effort is normal.  Musculoskeletal:     Right shoulder: No swelling, deformity, laceration, tenderness, bony tenderness or crepitus.     Left shoulder: No swelling, deformity, laceration, tenderness, bony tenderness or crepitus. Normal range of motion.     Right upper arm: Normal.     Left upper arm: Normal.     Right elbow: Normal.     Left elbow: No swelling, deformity, effusion or lacerations. Normal range of motion. Tenderness present in olecranon process.     Right forearm: Normal.     Left forearm: Tenderness present. No swelling, edema, deformity, lacerations or bony tenderness.     Right wrist: Normal.     Left wrist: Normal.     Right hand: No swelling, deformity, lacerations, tenderness or bony tenderness. Normal range of motion. Normal sensation. Normal pulse.     Left hand: No swelling, deformity, lacerations, tenderness or bony tenderness. Normal range of motion. Normal sensation. Normal capillary refill. Normal pulse.     Cervical back: No swelling, edema, deformity, erythema, signs of trauma,  lacerations, rigidity, spasms, torticollis, tenderness, bony tenderness or crepitus. No pain with movement, spinous process tenderness or muscular tenderness. Normal range of motion.     Thoracic back: No swelling, edema, deformity, signs of trauma, lacerations, spasms, tenderness or bony tenderness.     Lumbar back: No swelling, edema, deformity, signs of trauma, lacerations, spasms, tenderness or bony tenderness.     Right upper leg: Normal.     Left upper leg: Normal.     Right knee: No swelling, deformity, effusion, erythema, ecchymosis, lacerations, bony tenderness or crepitus. Normal range of motion. No tenderness. Normal alignment.     Left knee: Bony tenderness present. No swelling, deformity, effusion, erythema, ecchymosis, lacerations or crepitus. Normal range of motion. No tenderness. Normal alignment.  Right lower leg: No swelling, deformity, lacerations, tenderness or bony tenderness. Edema present.     Left lower leg: No swelling, deformity, lacerations, tenderness or bony tenderness. Edema present.     Right ankle: No swelling, deformity, ecchymosis or lacerations. No tenderness. Normal range of motion.     Left ankle: No swelling, deformity, ecchymosis or lacerations. Normal range of motion.     Right foot: Normal range of motion and normal capillary refill. No swelling, deformity, laceration, tenderness, bony tenderness or crepitus. Normal pulse.     Left foot: Normal range of motion and normal capillary refill. No swelling, deformity, laceration, tenderness, bony tenderness or crepitus. Normal pulse.     Comments: Tenderness over left patella.  Full active range of motion to left knee.  Pelvis stable.  No midline tenderness or deformity to cervical, thoracic, or lumbar spine.  Skin:    General: Skin is warm and dry.  Neurological:     General: No focal deficit present.     Mental Status: Brooke Hughes is alert and oriented to person, place, and time.     GCS: GCS eye subscore is 4.  GCS verbal subscore is 5. GCS motor subscore is 6.     Cranial Nerves: Cranial nerves 2-12 are intact. No cranial nerve deficit, dysarthria or facial asymmetry.  Psychiatric:        Behavior: Behavior is cooperative.    ED Results / Procedures / Treatments   Labs (all labs ordered are listed, but only abnormal results are displayed) Labs Reviewed - No data to display  EKG None  Radiology DG Elbow Complete Left  Result Date: 08/26/2021 CLINICAL DATA:  Pain EXAM: LEFT ELBOW - COMPLETE 3+ VIEW COMPARISON:  None. FINDINGS: There is no evidence of fracture, dislocation, or joint effusion. There is no evidence of arthropathy or other focal bone abnormality. Soft tissues are unremarkable. IMPRESSION: Negative left elbow radiographs. Electronically Signed   By: Maurine Simmering M.D.   On: 08/26/2021 13:50   DG Forearm Left  Result Date: 08/26/2021 CLINICAL DATA:  pain after fall EXAM: LEFT FOREARM - 2 VIEW COMPARISON:  None. FINDINGS: There is no evidence of fracture or other focal bone lesions. Soft tissues are unremarkable. IMPRESSION: Negative left forearm radiographs. Electronically Signed   By: Maurine Simmering M.D.   On: 08/26/2021 13:49   CT HEAD WO CONTRAST (5MM)  Result Date: 08/26/2021 CLINICAL DATA:  Fall with head trauma.  Anticoagulation. EXAM: CT HEAD WITHOUT CONTRAST TECHNIQUE: Contiguous axial images were obtained from the base of the skull through the vertex without intravenous contrast. RADIATION DOSE REDUCTION: This exam was performed according to the departmental dose-optimization program which includes automated exposure control, adjustment of the mA and/or kV according to patient size and/or use of iterative reconstruction technique. COMPARISON:  01/10/2020 FINDINGS: Brain: The brainstem, cerebellum, cerebral peduncles, thalami, basal ganglia, basilar cisterns, and ventricular system appear within normal limits. No intracranial hemorrhage, mass lesion, or acute CVA. Vascular:  Unremarkable Skull: The calvarium appears intact. There appears to be a well corticated defect in the right posteromedial ring of C1 image 2 of series 3. Given the well corticated appearance, I am skeptical that this represents an acute fracture and more likely at simply represents failure fusion of the posterior arch of C1 adjacent to the lateral mass of C1. This level was not quite included on the prior CT from 01/10/2020. Sinuses/Orbits: Mild chronic right maxillary sinusitis. Other: No supplemental non-categorized findings. IMPRESSION: 1. No acute intracranial findings. 2. 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 The well corticated margins would not be typical for a fracture. Correlate with the patient's mechanism of injury in determining whether CT scan of the cervical spine is warranted. Electronically Signed   By: Van Clines M.D.   On: 08/26/2021 14:37   CT Cervical Spine Wo Contrast  Result Date: 08/26/2021 CLINICAL DATA:  Trauma EXAM: CT CERVICAL SPINE WITHOUT CONTRAST TECHNIQUE: Multidetector CT imaging of the cervical spine was performed without intravenous contrast. Multiplanar CT image reconstructions were also generated. RADIATION DOSE REDUCTION: This exam was performed according to the departmental dose-optimization program which includes automated exposure control, adjustment of the mA and/or kV according to patient size and/or use of iterative reconstruction technique. COMPARISON:  None. FINDINGS: Alignment: No static subluxation. Facets are aligned. Occipital condyles and the lateral masses of C1 and C2 are normally approximated. Skull base and vertebrae: No acute fracture. Soft tissues and spinal canal: No prevertebral fluid or swelling. No visible canal hematoma. Disc levels: C5-7 degenerative disc disease. Upper chest: No pneumothorax, pulmonary nodule or pleural effusion. Other: Normal  visualized paraspinal cervical soft tissues. IMPRESSION: No acute fracture or static subluxation of the cervical spine. Electronically Signed   By: Ulyses Jarred M.D.   On: 08/26/2021 17:07   CT Knee Left Wo Contrast  Result Date: 08/26/2021 CLINICAL DATA:  Knee trauma, tibial plateau fracture. EXAM: CT OF THE LEFT KNEE WITHOUT CONTRAST TECHNIQUE: Multidetector CT imaging of the left knee was performed according to the standard protocol. Multiplanar CT image reconstructions were also generated. RADIATION DOSE REDUCTION: This exam was performed according to the departmental dose-optimization program which includes automated exposure control, adjustment of the mA and/or kV according to patient size and/or use of iterative reconstruction technique. COMPARISON:  Radiograph performed earlier on the same date FINDINGS: Bones/Joint/Cartilage No fracture or dislocation. Normal alignment. Small suprapatellar joint effusion. Moderate to severe tricompartmental joint space narrowing with large marginal osteophytes. Ligaments Ligaments are suboptimally evaluated by CT. Muscles and Tendons Muscles are normal in bulk and density.  No intra muscular hematoma. Soft tissue No fluid collection or hematoma.  No soft tissue mass. IMPRESSION: 1.  No evidence of fracture or dislocation. 2. 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 Moderate suprapatellar joint effusion, likely reactive. Electronically Signed   By: Keane Police D.O.   On: 08/26/2021 17:31   DG Knee Complete 4 Views Left  Result Date: 08/26/2021 CLINICAL DATA:  Fall, knee pain EXAM: LEFT KNEE - COMPLETE 4+ VIEW COMPARISON:  None. FINDINGS: There is subtle fragmentation of the lateral plateau of the left tibia, of uncertain acuity, seen on frontal view. There is otherwise severe tricompartmental joint space narrowing and osteophytosis, worst in the medial and patellofemoral compartments. Probable  moderate knee joint effusion. Soft tissues are unremarkable. IMPRESSION: 1. There is subtle fragmentation of the lateral plateau of the left tibia, of uncertain acuity, seen on frontal view. Correlate for acute point tenderness. CT or MR may be used to further evaluate for fracture and acuity. 2. There is otherwise severe tricompartmental joint space narrowing and osteophytosis, worst in the medial and patellofemoral compartments. 3. Probable moderate knee joint effusion. Electronically Signed   By: Delanna Ahmadi M.D.   On: 08/26/2021 14:00    Procedures Procedures    Medications Ordered in ED Medications  acetaminophen (TYLENOL) tablet 1,000 mg (1,000 mg Oral Given 08/26/21  1247)  lisinopril (ZESTRIL) tablet 20 mg (20 mg Oral Given 08/26/21 1354)    ED Course/ Medical Decision Making/ A&P                           Medical Decision Making Amount and/or Complexity of Data Reviewed Radiology: ordered.  Risk OTC drugs. Prescription drug management.   Alert 51 year old female in no acute stress, nontoxic-appearing.  Presents emergency department with a chief plaint of left knee and left elbow pain after suffering a fall.    Information obtained from patient.  Past medical records were reviewed including previous provider notes, labs, and imaging.  Patient has medical history as outlined in HPI which complicates her care.  Due to patient being on blood thinners and unsure of hitting her head will obtain noncontrast head CTs to evaluate for intracranial hemorrhage.  Due to reports of left knee and left elbow pain will obtain x-ray imaging.  Patient has a history of hypertension.  Noted to be hypertensive upon arrival.  Brooke Hughes denies any chest pain, shortness of breath, headache, visual disturbance, numbness, or weakness.  Reports that Brooke Hughes did not take her blood pressure medications today.  Suspect that blood pressure is elevated due to missing home medication as well as pain.  We will give patient  home dose of lisinopril.  I personally viewed and interpreted x-ray imaging.  I agree with radiology interpretation of -No acute osseous abnormality to left forearm or elbow -Subtle fragmentation of the lateral plateau on the left tibia.  Severe tricompartmental joint space narrowing and osteophytes.  CT head imaging shows no acute intracranial findings.  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    Due to concern for tibial plateau fracture will obtain CT imaging of left knee.  Due to concern for C1 abnormality in setting of fall will obtain CT imaging of cervical spine.  CT imaging of left knee shows no evidence of fracture or dislocation.  CT imaging of cervical spine shows no acute fracture or subluxation.  Patient reports that Brooke Hughes has improvement in pain after receiving Tylenol.  Is able to stand and ambulate without difficulty.  Will discharge patient at this time to follow-up with orthopedic specialist pain does not improve.        Final Clinical Impression(s) / ED Diagnoses Final diagnoses:  Fall, initial encounter  Acute pain of left knee  Left elbow pain  Hypertension, unspecified type    Rx / DC Orders ED Discharge Orders     None         Loni Beckwith, [place] 08/26/21 1756

## 2021-08-26 NOTE — Discharge Instructions (Addendum)
You came to the emergency department today to be evaluated for your knee and elbow pain after suffering a fall.  The x-ray of your knee was concerning for possible tibial plateau fracture however repeat imaging with CT scan showed no acute fractures.  The x-rays of your left elbow and forearm do not show any broken bones. ? ?Please take Tylenol (acetaminophen) to relieve your pain.  You make take tylenol, up to 1,000 mg (two extra strength pills) every 8 hours as needed.  Do not take more than 3,000 mg tylenol in a 24 hour period (not more than one dose every 8 hours.  Please check all medication labels as many medications such as pain and cold medications may contain tylenol.  Do not drink alcohol while taking these medications.  ? ?Please follow-up with your primary care provider for further management of your blood pressure. ? ?Get help right away if: ?You have: ?Loss of feeling (numbness), tingling, or weakness in your arms or legs. ?Very bad neck pain, especially tenderness in the middle of the back of your neck. ?A change in your ability to control your pee or poop (stool). ?More pain in any area of your body. ?Swelling in any area of your body, especially your legs. ?Shortness of breath or light-headedness. ?Chest pain. ?Blood in your pee, poop, or vomit. ?Very bad pain in your belly (abdomen) or your back. ?Very bad headaches or headaches that are getting worse. ?Sudden vision loss or double vision. ?Your eye suddenly turns red. ?The black center of your eye (pupil) is an odd shape or size. ?

## 2021-08-31 DIAGNOSIS — I639 Cerebral infarction, unspecified: Secondary | ICD-10-CM

## 2021-08-31 HISTORY — DX: Cerebral infarction, unspecified: I63.9

## 2021-09-02 DIAGNOSIS — E1165 Type 2 diabetes mellitus with hyperglycemia: Secondary | ICD-10-CM | POA: Insufficient documentation

## 2021-09-05 ENCOUNTER — Other Ambulatory Visit (HOSPITAL_COMMUNITY): Payer: Self-pay

## 2021-09-05 DIAGNOSIS — Z91199 Patient's noncompliance with other medical treatment and regimen due to unspecified reason: Secondary | ICD-10-CM | POA: Insufficient documentation

## 2021-09-07 DIAGNOSIS — I63311 Cerebral infarction due to thrombosis of right middle cerebral artery: Secondary | ICD-10-CM | POA: Insufficient documentation

## 2021-09-07 DIAGNOSIS — E7800 Pure hypercholesterolemia, unspecified: Secondary | ICD-10-CM | POA: Insufficient documentation

## 2021-09-09 DIAGNOSIS — E559 Vitamin D deficiency, unspecified: Secondary | ICD-10-CM | POA: Insufficient documentation

## 2021-11-23 DIAGNOSIS — Z131 Encounter for screening for diabetes mellitus: Secondary | ICD-10-CM

## 2021-11-23 LAB — POCT GLYCOSYLATED HEMOGLOBIN (HGB A1C): Hemoglobin A1C: 10.5 % — AB (ref 4.0–5.6)

## 2021-11-23 LAB — GLUCOSE, POCT (MANUAL RESULT ENTRY): POC Glucose: 322 mg/dl — AB (ref 70–99)

## 2022-05-01 ENCOUNTER — Encounter: Payer: Self-pay | Admitting: Student

## 2022-05-01 ENCOUNTER — Other Ambulatory Visit (HOSPITAL_COMMUNITY)
Admission: RE | Admit: 2022-05-01 | Discharge: 2022-05-01 | Disposition: A | Discharge status: Home / Self Care | Payer: Commercial Managed Care - HMO | Source: Ambulatory Visit | Attending: Student in an Organized Health Care Education/Training Program | Admitting: Student in an Organized Health Care Education/Training Program

## 2022-05-01 ENCOUNTER — Ambulatory Visit (INDEPENDENT_AMBULATORY_CARE_PROVIDER_SITE_OTHER): Payer: Commercial Managed Care - HMO | Admitting: Student

## 2022-05-01 VITALS — BP 185/140 | HR 74 | Ht 74.0 in | Wt 321.3 lb

## 2022-05-01 DIAGNOSIS — I10 Essential (primary) hypertension: Secondary | ICD-10-CM | POA: Diagnosis not present

## 2022-05-01 DIAGNOSIS — Z1159 Encounter for screening for other viral diseases: Secondary | ICD-10-CM

## 2022-05-01 DIAGNOSIS — Z114 Encounter for screening for human immunodeficiency virus [HIV]: Secondary | ICD-10-CM | POA: Insufficient documentation

## 2022-05-01 DIAGNOSIS — Z794 Long term (current) use of insulin: Secondary | ICD-10-CM

## 2022-05-01 DIAGNOSIS — N898 Other specified noninflammatory disorders of vagina: Secondary | ICD-10-CM | POA: Insufficient documentation

## 2022-05-01 DIAGNOSIS — Z7984 Long term (current) use of oral hypoglycemic drugs: Secondary | ICD-10-CM

## 2022-05-01 DIAGNOSIS — E1165 Type 2 diabetes mellitus with hyperglycemia: Secondary | ICD-10-CM

## 2022-05-01 DIAGNOSIS — N390 Urinary tract infection, site not specified: Secondary | ICD-10-CM | POA: Diagnosis present

## 2022-05-01 DIAGNOSIS — Z124 Encounter for screening for malignant neoplasm of cervix: Secondary | ICD-10-CM | POA: Insufficient documentation

## 2022-05-01 DIAGNOSIS — I824Y2 Acute embolism and thrombosis of unspecified deep veins of left proximal lower extremity: Secondary | ICD-10-CM

## 2022-05-01 DIAGNOSIS — Z8673 Personal history of transient ischemic attack (TIA), and cerebral infarction without residual deficits: Secondary | ICD-10-CM | POA: Insufficient documentation

## 2022-05-01 HISTORY — DX: Encounter for screening for other viral diseases: Z11.59

## 2022-05-01 HISTORY — DX: Encounter for screening for human immunodeficiency virus (HIV): Z11.4

## 2022-05-01 MED ORDER — HYDROCHLOROTHIAZIDE 25 MG PO TABS: 25.0000 mg | ORAL_TABLET | Freq: Every day | ORAL | 5 refills | Status: DC

## 2022-05-01 MED ORDER — LISINOPRIL 20 MG PO TABS: 20.0000 mg | ORAL_TABLET | Freq: Every day | ORAL | 3 refills | Status: DC

## 2022-05-01 MED ORDER — SEMAGLUTIDE(0.25 OR 0.5MG/DOS) 2 MG/3ML ~~LOC~~ SOPN: 0.2500 mg | PEN_INJECTOR | SUBCUTANEOUS | 3 refills | Status: DC

## 2022-05-01 MED ORDER — PEN NEEDLES 32G X 4 MM MISC: 1.0000 [IU] | 1 refills | Status: DC

## 2022-05-01 MED ORDER — METFORMIN HCL 500 MG PO TABS: 1000.0000 mg | ORAL_TABLET | Freq: Two times a day (BID) | ORAL | 3 refills | Status: DC

## 2022-05-01 MED ORDER — APIXABAN 5 MG PO TABS: 5.0000 mg | ORAL_TABLET | Freq: Two times a day (BID) | ORAL | 3 refills | Status: DC

## 2022-05-01 NOTE — Assessment & Plan Note (Signed)
Patient has documented history of ischemic stroke noted in March 2023.  Patient was discharged on aspirin 81 mg daily as well as Lipitor 80 mg daily.  There was work-up for possible embolic stroke, which was negative.  There is a history of patient having a possible PFO, in which patient was evaluated by cardiology in which patient was offered a cardiac monitor, and patient declined at the time.  Patient is currently managed for secondary prevention with aspirin 81 mg daily and Lipitor 80 mg daily.  Plan: -Continue aspirin 81 mg daily -Continue atorvastatin 80 mg daily -Patient follows with neurology

## 2022-05-01 NOTE — Assessment & Plan Note (Addendum)
Patient has past medical history of type 2 diabetes.  Patient is unclear on what medications she takes.  I speak to daughter on phone and patient daughter states that the patient needs to be on insulin and is no longer on insulin patient's only medications currently include metformin 1000 mg daily.  Patient's daughter is unsure about Invokana, but per charting patient is on Invokana 100 mg daily, but patient is not taking this.  Unclear.  Patient's A1c in 11/23/21 was 10.5.  Patient's only medication was metformin.  Given patient is overweight as well as A1c at 10.5, I am inclined to start Ozempic.  Patient is agreeable to starting Ozempic.  Patient is encouraged to decrease carbohydrate intake.  Patient is encouraged to exercise.  Patient is counseled on importance of good glucose control.  Plan: -Daughter updated on medication changes -Given patient might have UTI, will not start SGLT2 at this time -Start Ozempic 0.25 mg weekly -Continue metformin 1000 mg twice daily -A1c pending -Urine microalbumin/creatinine ratio pending -Ophthalmology referral made for dilated retinal exam  Addendum: -A1c returned at 15. This is very high. Spoke to daughter on the phone as patient is not able to manage her own medications and the daughter feared that the A1c would be this high, as patient is not very compliant with her medications.  Plan will be to start insulin.  I will start 29 units at bedtime.  Patient has been on insulin before, and with speaking with daughter on the phone, daughter is very comfortable administering insulin.  Continue with Ozempic 0.25 mg weekly.  Continue with metformin 1000 mg twice daily.  Patient also has meter at home per daughter, and they will check her blood sugars at home. -Urine micro albumin/creatinine ratio within normal limits -Close follow-up made for 05/15/2022

## 2022-05-01 NOTE — Assessment & Plan Note (Addendum)
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  Patient was on Eliquis 5 mg twice daily.  Patient is no longer taking this.  No concern for clots at this time.

## 2022-05-01 NOTE — Assessment & Plan Note (Signed)
Patient is agreeable to HIV screening today.  HIV screening ordered

## 2022-05-01 NOTE — Assessment & Plan Note (Signed)
Patient is agreeable for hepatitis C screening today.  Hepatitis C screening ordered

## 2022-05-01 NOTE — Assessment & Plan Note (Addendum)
Patient reports a 2-week history of vaginal discharge and burning sensation with urination.  Patient denies any recent antibiotic use.  Patient denies any recent sexual intercourse.  Patient describes this as a itching and burning sensation.  She reports her discharge is cloudy and white, and also notices cloudiness in her urine.  Patient denies any cramping or any spotting.  Patient does report fevers and chills.  Patient had a fever of 101 F last week, but has not had any fevers today or this week.  Patient describes a foul smelling odor.  Pelvic exam performed with chaperone at bedside.  Pelvic exam showed cloudy vaginal discharge noted near cervical os, no vesicles, no bleeding noted.  Current differential includes bacterial vaginitis, UTI, or possible Candida vaginitis.  Pap smear performed at same time.  Wet prep performed as well.  Plan: -Wet prep pending -Pap smear cytology pending -UA pending -Urine culture pending -Plan will be to treat accordingly depending on results  Addendum: -UA showing 3+ glucose.  Wet prep positive for Candida vaginitis and Candida glabrata.  Per my reading, Candida glabrata is a frequent vaginal colonizer that has low vaginal virulence and rarely causes symptoms, and other causes should be ruled out first. Plan would be to treat for candida albicans first to see if this would treat the symptoms. If not successful, plan would be for boric acid or nystatin suppositories. -Close follow up on 05/15/22 -Pap smear negative

## 2022-05-01 NOTE — Progress Notes (Addendum)
CC: Vaginal Discharge   HPI:  [name] is a 51 y.o. female who presents to the clinic as a new patient with concerns of vaginal discharge.  Please see assessment and plan for full HPI.  History: Medical: Type 2 diabetes, hypertension, obesity, iron deficiency anemia, stroke Surgical: Femur fracture repair Family: Mother with diabetes and father with diabetes Social: Patient is dependent on daughter for medication management  Past Medical History:  Diagnosis Date   Diabetes mellitus without complication ([place])    Hypertension    Obesity      Current Outpatient Medications:    amLODipine (NORVASC) 10 MG tablet, Take 10 mg by mouth daily., Disp: , Rfl:    aspirin 81 MG chewable tablet, Chew 81 mg by mouth daily., Disp: , Rfl:    atorvastatin (LIPITOR) 80 MG tablet, Take 80 mg by mouth daily., Disp: , Rfl:    carvedilol (COREG) 12.5 MG tablet, 12.5 mg 2 (two) times daily with a meal., Disp: , Rfl:    fluconazole (DIFLUCAN) 150 MG tablet, Take 1 tablet (150 mg total) by mouth daily for 2 doses. Please take 1 tablet on day 1. If symptoms do not resolve in 3 days, take the 2nd tablet., Disp: 2 tablet, Rfl: 0   hydrochlorothiazide (HYDRODIURIL) 25 MG tablet, Take 1 tablet (25 mg total) by mouth daily., Disp: 30 tablet, Rfl: 5   Insulin Glargine (BASAGLAR KWIKPEN) 100 UNIT/ML, Inject 29 Units into the skin daily., Disp: 8.7 mL, Rfl: 5   Insulin Pen Needle (PEN NEEDLES) 32G X 4 MM MISC, 1 Units by Does not apply route once a week., Disp: 100 each, Rfl: 1   metFORMIN (GLUCOPHAGE) 500 MG tablet, Take 1,000 mg by mouth 2 (two) times daily with a meal., Disp: , Rfl:    Semaglutide,0.25 or 0.'5MG'$ /DOS, 2 MG/3ML SOPN, Inject 0.25 mg into the skin once a week., Disp: 3 mL, Rfl: 3   lisinopril (ZESTRIL) 20 MG tablet, Take 1 tablet (20 mg total) by mouth daily., Disp: 30 tablet, Rfl: 3   Multiple Vitamin (MULTIVITAMIN WITH MINERALS) TABS tablet, Take 1 tablet by mouth daily., Disp: , Rfl:     senna-docusate (SENOKOT-S) 8.6-50 MG tablet, Take 1 tablet by mouth daily., Disp: 30 tablet, Rfl: 0  Review of Systems:    Constitutional: Patient does endorse fevers and chills Respiratory: Patient denies any shortness of breath Cardiovascular: Patient denies any chest pain GU: Patient endorse vaginal discharge, dysuria, and foul odor Physical Exam:  Vitals:   05/01/22 1107 05/01/22 1129  BP: (!) 174/126 (!) 185/140  Pulse: 81 74  SpO2: 94%   Weight: (!) 321 lb 4.8 oz (145.7 kg)   Height: '6\' 2"'$  (1.88 m)     General: Patient is sitting comfortably in the room  Head: Normocephalic, atraumatic  Cardio: Regular rate and rhythm, no murmurs, rubs or gallops.  Pulmonary: Clear to ausculation bilaterally with no rales, rhonchi, and crackles  Abdomen: Soft, nontender with normoactive bowel sounds with no rebound or guarding  Pelvic exam: Chaperone at bedside, no vesicles or bumps noted on external genitalia.  Speculum used to observe cervical os.  Vaginal discharge cloudy in nature observed.  No spotting noted.  No hemorrhaging noted on cervical os.   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  Patient was on Eliquis 5 mg twice daily.  Patient is no longer taking this.  No  concern for clots at this time.   HTN (hypertension) Patient has a past medical history of hypertension.  Patient's current regimen includes lisinopril 20 mg daily, amlodipine 10 mg daily, Coreg 12.5 mg twice daily.  Patient presents today with blood pressure in the 180s.  Patient denies any headaches, vision changes, lightheadedness, or dizziness.  Patient heart rate at 81.  Patient denies taking her medication this morning, but I still feel that this blood pressure is very elevated and needs to be addressed.  Patient has some difficulty understanding which medications she takes and her daughter manages all of her  medications.  I spoke to daughter on the phone, and explained that her blood pressure very high and she needs a new medication.  Patient's daughter is understanding, and is agreeable.  Patient is agreeable to new medication as well.  Plan: -Continue amlodipine 10 mg daily -Continue Coreg 12.5 mg twice daily -Continue lisinopril 20 mg daily -Start HCTZ 25 mg daily -Encourage patient to keep blood pressure log -Encourage patient to bring in medication list at next visit as well as blood pressure cuff -Patient to follow-up in 2 to 3 weeks for blood pressure recheck, patient encouraged to take medication prior to visit   Type 2 diabetes mellitus ([place]) Patient has past medical history of type 2 diabetes.  Patient is unclear on what medications she takes.  I speak to daughter on phone and patient daughter states that the patient needs to be on insulin and is no longer on insulin patient's only medications currently include metformin 1000 mg daily.  Patient's daughter is unsure about Invokana, but per charting patient is on Invokana 100 mg daily, but patient is not taking this.  Unclear.  Patient's A1c in 11/23/21 was 10.5.  Patient's only medication was metformin.  Given patient is overweight as well as A1c at 10.5, I am inclined to start Ozempic.  Patient is agreeable to starting Ozempic.  Patient is encouraged to decrease carbohydrate intake.  Patient is encouraged to exercise.  Patient is counseled on importance of good glucose control.  Plan: -Daughter updated on medication changes -Given patient might have UTI, will not start SGLT2 at this time -Start Ozempic 0.25 mg weekly -Continue metformin 1000 mg twice daily -A1c pending -Urine microalbumin/creatinine ratio pending -Ophthalmology referral made for dilated retinal exam  Addendum: -A1c returned at 15. This is very high. Spoke to daughter on the phone as patient is not able to manage her own medications and the daughter feared that the A1c  would be this high, as patient is not very compliant with her medications.  Plan will be to start insulin.  I will start 29 units at bedtime.  Patient has been on insulin before, and with speaking with daughter on the phone, daughter is very comfortable administering insulin.  Continue with Ozempic 0.25 mg weekly.  Continue with metformin 1000 mg twice daily.  Patient also has meter at home per daughter, and they will check her blood sugars at home. -Urine micro albumin/creatinine ratio within normal limits -Close follow-up made for 05/15/2022   Encounter for hepatitis C screening test for low risk patient Patient is agreeable for hepatitis C screening today.  Hepatitis C screening ordered  Encounter for screening for HIV Patient is agreeable to HIV screening today.  HIV screening ordered  Vaginal discharge Patient reports a 2-week history of vaginal discharge and burning sensation with urination.  Patient denies any recent antibiotic use.  Patient denies any recent sexual intercourse.  Patient  describes this as a itching and burning sensation.  She reports her discharge is cloudy and white, and also notices cloudiness in her urine.  Patient denies any cramping or any spotting.  Patient does report fevers and chills.  Patient had a fever of 101 F last week, but has not had any fevers today or this week.  Patient describes a foul smelling odor.  Pelvic exam performed with chaperone at bedside.  Pelvic exam showed cloudy vaginal discharge noted near cervical os, no vesicles, no bleeding noted.  Current differential includes bacterial vaginitis, UTI, or possible Candida vaginitis.  Pap smear performed at same time.  Wet prep performed as well.  Plan: -Wet prep pending -Pap smear cytology pending -UA pending -Urine culture pending -Plan will be to treat accordingly depending on results  Addendum: -UA showing 3+ glucose.  Wet prep positive for Candida vaginitis and Candida glabrata.  Per my  reading, Candida glabrata is a frequent vaginal colonizer that has low vaginal virulence and rarely causes symptoms, and other causes should be ruled out first. Plan would be to treat for candida albicans first to see if this would treat the symptoms. If not successful, plan would be for boric acid or nystatin suppositories. -Close follow up on 05/15/22  Screening for cervical cancer Patient due for cervical cancer screening.  Patient agreeable for Pap smear today.  Plan: -Pap smear results pending  History of cerebrovascular accident (CVA) in adulthood Patient has documented history of ischemic stroke noted in March 2023.  Patient was discharged on aspirin 81 mg daily as well as Lipitor 80 mg daily.  There was work-up for possible embolic stroke, which was negative.  There is a history of patient having a possible PFO, in which patient was evaluated by cardiology in which patient was offered a cardiac monitor, and patient declined at the time.  Patient is currently managed for secondary prevention with aspirin 81 mg daily and Lipitor 80 mg daily.  Plan: -Continue aspirin 81 mg daily -Continue atorvastatin 80 mg daily -Patient follows with neurology   Patient seen with Dr. Wray Kearns, DO PGY-1 Internal Medicine Resident  Pager: [phone]

## 2022-05-01 NOTE — Assessment & Plan Note (Signed)
Patient due for cervical cancer screening.  Patient agreeable for Pap smear today.  Plan: -Pap smear results pending

## 2022-05-01 NOTE — Assessment & Plan Note (Signed)
Patient has a past medical history of hypertension.  Patient's current regimen includes lisinopril 20 mg daily, amlodipine 10 mg daily, Coreg 12.5 mg twice daily.  Patient presents today with blood pressure in the 180s.  Patient denies any headaches, vision changes, lightheadedness, or dizziness.  Patient heart rate at 81.  Patient denies taking her medication this morning, but I still feel that this blood pressure is very elevated and needs to be addressed.  Patient has some difficulty understanding which medications she takes and her daughter manages all of her medications.  I spoke to daughter on the phone, and explained that her blood pressure very high and she needs a new medication.  Patient's daughter is understanding, and is agreeable.  Patient is agreeable to new medication as well.  Plan: -Continue amlodipine 10 mg daily -Continue Coreg 12.5 mg twice daily -Continue lisinopril 20 mg daily -Start HCTZ 25 mg daily -Encourage patient to keep blood pressure log -Encourage patient to bring in medication list at next visit as well as blood pressure cuff -Patient to follow-up in 2 to 3 weeks for blood pressure recheck, patient encouraged to take medication prior to visit

## 2022-05-01 NOTE — Patient Instructions (Addendum)
Brooke Hughes, [place] you for allowing me to take part in your care today.  Here are your instructions.  1.  Regarding your diabetes, I have changed him his medication.  Please continue to take metformin 1000 mg twice a day.  I also added Ozempic, this is an injection, please inject 0.25 mg weekly.  Do not use Ozempic daily.  Please follow-up in 3 months for repeat A1c.  I have checked A1c today as well as for protein in your urine.  Please await for phone call with the results.  2.  Regarding your high blood pressure, I have changed your medication.  I want you to continue taking amlodipine 10 mg daily I want you to continue taking carvedilol 12.5 mg twice a day and continue taking lisinopril 20 mg daily.  I have added HCTZ 25 mg daily.  Add this to your regimen.  3.  Regarding your burning sensation, I have did a Pap smear today with wet prep.  I will call you with the results.  I have also checked for urine infection, I will call you with the results.  4.  I am screening for hepatitis C and HIV, I will follow with the results  5.  I have referred you for a ophthalmology appointment for dilated eye exam  6.  Please follow-up in 2 to 3 weeks for blood pressure follow-up.   Thank you, Dr. Posey Pronto  If you have any other questions please contact the internal medicine clinic at [phone]

## 2022-05-02 LAB — HEPATITIS C ANTIBODY: Hep C Virus Ab: NONREACTIVE

## 2022-05-02 LAB — URINALYSIS, ROUTINE W REFLEX MICROSCOPIC
Bilirubin, UA: NEGATIVE
Ketones, UA: NEGATIVE
Leukocytes,UA: NEGATIVE
Nitrite, UA: NEGATIVE
Protein,UA: NEGATIVE
RBC, UA: NEGATIVE
Urobilinogen, Ur: 0.2 mg/dL (ref 0.2–1.0)
pH, UA: 5 (ref 5.0–7.5)

## 2022-05-02 LAB — CYTOLOGY - PAP
Adequacy: ABSENT
Comment: NEGATIVE
Diagnosis: NEGATIVE
High risk HPV: NEGATIVE

## 2022-05-02 LAB — CERVICOVAGINAL ANCILLARY ONLY
Bacterial Vaginitis (gardnerella): NEGATIVE
Candida Glabrata: POSITIVE — AB
Candida Vaginitis: POSITIVE — AB
Chlamydia: NEGATIVE
Comment: NEGATIVE
Comment: NEGATIVE
Comment: NEGATIVE
Comment: NEGATIVE
Comment: NEGATIVE
Comment: NORMAL
Neisseria Gonorrhea: NEGATIVE
Trichomonas: NEGATIVE

## 2022-05-02 LAB — HIV ANTIBODY (ROUTINE TESTING W REFLEX): HIV Screen 4th Generation wRfx: NONREACTIVE

## 2022-05-02 LAB — MICROALBUMIN / CREATININE URINE RATIO
Creatinine, Urine: 55.3 mg/dL
Microalb/Creat Ratio: <5 mg/g creat (ref 0–29)
Microalbumin, Urine: <3 ug/mL

## 2022-05-02 LAB — HEMOGLOBIN A1C
Est. average glucose Bld gHb Est-mCnc: 384 mg/dL
Hgb A1c MFr Bld: 15 % — ABNORMAL HIGH (ref 4.8–5.6)

## 2022-05-02 MED ORDER — FLUCONAZOLE 150 MG PO TABS: 150.0000 mg | ORAL_TABLET | Freq: Every day | ORAL | 0 refills | Status: AC

## 2022-05-02 MED ORDER — BASAGLAR KWIKPEN 100 UNIT/ML ~~LOC~~ SOPN: 29.0000 [IU] | PEN_INJECTOR | Freq: Every day | SUBCUTANEOUS | 5 refills | Status: DC

## 2022-05-02 NOTE — Progress Notes (Signed)
A1c very elevated at 15, patient daughter updated about this, and plan will be to start insulin 29 units nightly with close follow up on 05/15/22 in which patient's daughter states that she will be present. UA showing 3+ glucose. No evidence of bacterial infection. HIV, non reactive. Wet prep showing evidence of candida vaginitis and candida glabrata. Per research, Standley Dakins is less likely to cause vaginal symptoms and other causes should be addressed first, so plan will be to treat with fluconazole. If symptoms do not resolve, plan will be to start either Boric acid or nystatin suppositories. Patient's daughter is updated with all of the results and patient's daughter is comfortable checking the patient's sugars at home and giving insulin to the patient. Close follow up with patient is scheduled for 05/15/22 in which the patient's daughter will accompany the patient.

## 2022-05-02 NOTE — Addendum Note (Signed)
Addended by: Leigh Aurora on: 05/02/2022 04:05 PM   Modules accepted: Orders

## 2022-05-03 LAB — URINE CULTURE

## 2022-05-05 ENCOUNTER — Encounter: Payer: Self-pay | Admitting: Student

## 2022-05-05 NOTE — Progress Notes (Signed)
Internal Medicine Clinic Attending  I saw and evaluated the patient.  I personally confirmed the key portions of the history and exam documented by Dr. Patel and I reviewed pertinent patient test results.  The assessment, diagnosis, and plan were formulated together and I agree with the documentation in the resident's note.  

## 2022-05-15 ENCOUNTER — Encounter: Payer: Commercial Managed Care - HMO | Admitting: Student

## 2022-05-22 ENCOUNTER — Other Ambulatory Visit: Payer: Self-pay

## 2022-05-22 ENCOUNTER — Encounter: Payer: Self-pay | Admitting: Student

## 2022-05-22 ENCOUNTER — Telehealth: Payer: Self-pay | Admitting: *Deleted

## 2022-05-22 ENCOUNTER — Ambulatory Visit (INDEPENDENT_AMBULATORY_CARE_PROVIDER_SITE_OTHER): Payer: Commercial Managed Care - HMO | Admitting: Student

## 2022-05-22 VITALS — BP 151/75 | HR 83 | Temp 97.9°F | Ht 73.0 in | Wt 318.0 lb

## 2022-05-22 DIAGNOSIS — Z59819 Housing instability, housed unspecified: Secondary | ICD-10-CM | POA: Diagnosis not present

## 2022-05-22 DIAGNOSIS — I10 Essential (primary) hypertension: Secondary | ICD-10-CM

## 2022-05-22 DIAGNOSIS — E1165 Type 2 diabetes mellitus with hyperglycemia: Secondary | ICD-10-CM | POA: Diagnosis not present

## 2022-05-22 MED ORDER — CONTOUR TEST VI STRP: ORAL_STRIP | 12 refills | Status: DC

## 2022-05-22 MED ORDER — PEN NEEDLES 32G X 4 MM MISC: 1.0000 [IU] | 11 refills | Status: DC

## 2022-05-22 MED ORDER — SEMAGLUTIDE(0.25 OR 0.5MG/DOS) 2 MG/3ML ~~LOC~~ SOPN: 0.5000 mg | PEN_INJECTOR | SUBCUTANEOUS | 3 refills | Status: DC

## 2022-05-22 MED ORDER — LANCETS 30G MISC: 1.0000 | Freq: Three times a day (TID) | 11 refills | Status: AC

## 2022-05-22 NOTE — Patient Instructions (Signed)
  Thank you, [name], for allowing [place] to provide your care today. Today we discussed . . .  > Diabetes       -Please continue to take insulin 29 units every night, metformin 1000 mg twice a day, and Ozempic.  You will take Ozempic 0.25 mg for 1 more week and then on 06/02/2022 he will increase to 0.5 mg weekly.  We have refilled your lancets and test trips so you can check your blood sugar in the morning.  We will have you back in 1 month to reevaluate and draw another A1c. > Hypertension       -Your pressure today is still elevated.  It sounds like you are doing well on the new medication so we will not make any changes today.  Please check your blood pressure at home with your home cuff and write down these values.  Try to check it every day.  Please continue amlodipine 10 mg daily, carvedilol 12.5 mg twice daily, lisinopril 20 mg daily, and hydrochlorothiazide 25 mg daily.   I have ordered the following labs for you:  Lab Orders  No laboratory test(s) ordered today      Tests ordered today:  None   Referrals ordered today:   Referral Orders  No referral(s) requested today      I have ordered the following medication/changed the following medications:   Stop the following medications: There are no discontinued medications.   Start the following medications: Meds ordered this encounter  Medications   glucose blood (CONTOUR TEST) test strip    Sig: Use as instructed    Dispense:  100 each    Refill:  12   Lancets 30G MISC    Sig: 1 each by Does not apply route every 8 (eight) hours.    Dispense:  100 each    Refill:  11      Follow up:  1 Month     Remember:     Should you have any questions or concerns please call the internal medicine clinic at [phone].     Johny Blamer, [place]

## 2022-05-22 NOTE — Progress Notes (Signed)
   CC: Follow-up hypertension and diabetes  HPI:  Brooke Hughes is a 51 y.o. female with PMH as below who presents to the clinic for follow-up for hypertension and diabetes.  Please see encounters tab for problem-based charting.  Past Medical History:  Diagnosis Date   Diabetes mellitus without complication ([employer])    Hypertension    Obesity    Review of Systems:   A comprehensive review of systems was negative.   Physical Exam:  Vitals:   05/22/22 1414 05/22/22 1518  BP: (!) 147/97 (!) 151/75  Pulse: 69 83  Temp: 97.9 F (36.6 C)   TempSrc: Oral   SpO2: 97%   Weight: (!) 318 lb (144.2 kg)   Height: '6\' 1"'$  (1.854 m)     Constitutional: Obese middle-age female. In no acute distress. HENT: Normocephalic, atraumatic,  Eyes: Sclera non-icteric,  EOM intact Cardio:Regular rate and rhythm. No murmurs, rubs, or gallops. 2+ bilateral radial pulses. Pulm:Clear to auscultation bilaterally. Normal work of breathing on room air. Abdomen: Soft, non-tender, non-distended, positive bowel sounds. SLH:[id number] for extremity edema. Skin:Warm and dry. Neuro:Alert and oriented x3. No focal deficit noted. Psych:Pleasant mood and affect.   Assessment & Plan:   HTN (hypertension) Blood pressure today still elevated at 151/75 after adding HCTZ 25 mg to her existing regimen of amlodipine 10 mg daily, carvedilol 12.5 mg twice daily and lisinopril 20 mg daily.  She was supposed to be checking her blood pressure daily and bring a log but she has had recent housing issues that have caused a lot of stress.  Due to not knowing how well this medication has been affecting her at home we will not make any changes today.  She will keep a log of her blood pressures and return in 1 month for reevaluation. - Continue hydrochlorothiazide 25 mg daily, amlodipine 10 mg daily, carvedilol 12.5 mg twice daily, and lisinopril 20 mg daily - Keep a blood pressure log and return in 1 month for reevaluation  Type 2  diabetes mellitus ([place]) Patient's A1c was recently 15 and she was started on 29 units of insulin glargine nightly along with Ozempic 0.25 mg weekly added onto her existing metformin 1000 mg twice daily.  At that time her urine microalbumin/creatinine ratio was normal.  She has not been able to check her sugars at home due to being out of test trips and lancets.  We will refill this today and she will start checking her sugar in the morning and once later in the day.  She denies any symptoms of low blood sugars after starting the insulin.  She also denies any nausea or vomiting or other common side effects of Ozempic.  She will bring her glucometer in with her in 1 month. - Continue insulin glargine 29 units nightly, metformin 1000 mg twice daily, and Ozempic 0.25 mg weekly to increase to 0.5 mg weekly on 06/02/2022    Patient seen with Dr. Willia Craze, [place] Internal Medicine Resident PGY-1 Pager: [phone]

## 2022-05-22 NOTE — Telephone Encounter (Signed)
Call from [facility] - stated pt's insurance prefers One-touch meter - send new rx. Also need new rx for test strips with the frequency of testing. Thanks

## 2022-05-23 ENCOUNTER — Telehealth: Payer: Self-pay | Admitting: *Deleted

## 2022-05-23 ENCOUNTER — Other Ambulatory Visit: Payer: Self-pay | Admitting: Student

## 2022-05-23 DIAGNOSIS — E1165 Type 2 diabetes mellitus with hyperglycemia: Secondary | ICD-10-CM

## 2022-05-23 MED ORDER — ONETOUCH VERIO W/DEVICE KIT: 1.0000 | PACK | 0 refills | Status: AC | PRN

## 2022-05-23 MED ORDER — ONETOUCH VERIO VI STRP: 1.0000 | ORAL_STRIP | Freq: Three times a day (TID) | 12 refills | Status: AC

## 2022-05-23 NOTE — Assessment & Plan Note (Signed)
Patient's A1c was recently 15 and she was started on 29 units of insulin glargine nightly along with Ozempic 0.25 mg weekly added onto her existing metformin 1000 mg twice daily.  At that time her urine microalbumin/creatinine ratio was normal.  She has not been able to check her sugars at home due to being out of test trips and lancets.  We will refill this today and she will start checking her sugar in the morning and once later in the day.  She denies any symptoms of low blood sugars after starting the insulin.  She also denies any nausea or vomiting or other common side effects of Ozempic.  She will bring her glucometer in with her in 1 month. - Continue insulin glargine 29 units nightly, metformin 1000 mg twice daily, and Ozempic 0.25 mg weekly to increase to 0.5 mg weekly on 06/02/2022

## 2022-05-23 NOTE — Progress Notes (Signed)
Internal Medicine Clinic Attending  I saw and evaluated the patient.  I personally confirmed the key portions of the history and exam documented by Dr. Goodwin and I reviewed pertinent patient test results.  The assessment, diagnosis, and plan were formulated together and I agree with the documentation in the resident's note.  

## 2022-05-23 NOTE — Assessment & Plan Note (Signed)
Blood pressure today still elevated at 151/75 after adding HCTZ 25 mg to her existing regimen of amlodipine 10 mg daily, carvedilol 12.5 mg twice daily and lisinopril 20 mg daily.  She was supposed to be checking her blood pressure daily and bring a log but she has had recent housing issues that have caused a lot of stress.  Due to not knowing how well this medication has been affecting her at home we will not make any changes today.  She will keep a log of her blood pressures and return in 1 month for reevaluation. - Continue hydrochlorothiazide 25 mg daily, amlodipine 10 mg daily, carvedilol 12.5 mg twice daily, and lisinopril 20 mg daily - Keep a blood pressure log and return in 1 month for reevaluation

## 2022-05-23 NOTE — Progress Notes (Signed)
  Care Coordination   Note   05/23/2022 Name: Brooke Hughes MRN: [id number] DOB: 10/21/1970  Brooke Hughes is a 51 y.o. year old female who sees Leigh Aurora, DO for primary care. I reached out to Brooke Hughes by phone today to offer care coordination services.  [name] was given information about Care Coordination services today including:   The Care Coordination services include support from the care team which includes your Nurse Coordinator, Clinical Social Worker, or Pharmacist.  The Care Coordination team is here to help remove barriers to the health concerns and goals most important to you. Care Coordination services are voluntary, and the patient may decline or stop services at any time by request to their care team member.   Care Coordination Consent Status: Patient agreed to services and verbal consent obtained.   Follow up plan:  Telephone appointment with care coordination team member scheduled for:  05/26/22  Encounter Outcome:  Pt. Scheduled  [place]  Direct Dial: [phone]

## 2022-05-26 ENCOUNTER — Ambulatory Visit: Payer: Self-pay | Admitting: Licensed Clinical Social Worker

## 2022-05-26 NOTE — Patient Instructions (Signed)
Visit Information  Thank you for taking time to visit with me today. Please don't hesitate to contact me if I can be of assistance to you.   Following are the goals we discussed today:   Goals Addressed               This Visit's Progress     CCM Expected Outcome:  Monitor, Self-Manage and Reduce Symptoms of Afib (pt-stated)        Patient disability is pending. SW educated patient on process. Patient has requested medical documents from PCP and will send documents to Social Security administration to complete disability application.   Patient currently residing with family, but wants a place of her own. Patient has $0.00 income. Patient stated she will wait to move once received disability. SW referred patient to [employer]. Patient stated she would apply.   Patient had no further needs or questions. SW advised patient to contact PCP for future needs and to place a referral for SW assistance.           Patient verbalizes understanding of instructions and care plan provided today and agrees to view in [place]. Active MyChart status and patient understanding of how to access instructions and care plan via MyChart confirmed with patient.     No further follow up required: .  Milus Height, Arita Miss, MSW, [place]  Social Worker [facility] Care Management  [phone]

## 2022-05-26 NOTE — Patient Outreach (Signed)
  Care Coordination   Initial Visit Note   05/26/2022 Name: Brooke Hughes MRN: [id number] DOB: [birth year]  Brooke Hughes is a 51 y.o. year old female who sees Leigh Aurora, DO for primary care. I spoke with  Brooke Hughes by phone today.  What matters to the patients health and wellness today?  Disability/ Housing    Goals Addressed               This Visit's Progress     CCM Expected Outcome:  Monitor, Self-Manage and Reduce Symptoms of Afib (pt-stated)        Patient disability is pending. SW educated patient on process. Patient has requested medical documents from PCP and will send documents to Social Security administration to complete disability application.   Patient currently residing with family, but wants a place of her own. Patient has $0.00 income. Patient stated she will wait to move once received disability. SW referred patient to [employer]. Patient stated she would apply.   Patient had no further needs or questions. SW advised patient to contact PCP for future needs and to place a referral for SW assistance.         SDOH assessments and interventions completed:  Yes     Care Coordination Interventions:  Yes, provided   Follow up plan: No further intervention required.   Encounter Outcome:  Pt. Visit Completed

## 2022-08-31 ENCOUNTER — Encounter (HOSPITAL_COMMUNITY): Payer: Self-pay

## 2022-08-31 ENCOUNTER — Ambulatory Visit (HOSPITAL_COMMUNITY)
Admission: EM | Admit: 2022-08-31 | Discharge: 2022-08-31 | Disposition: A | Discharge status: Home / Self Care | Payer: Commercial Managed Care - HMO | Attending: Emergency Medicine | Admitting: Emergency Medicine

## 2022-08-31 DIAGNOSIS — R300 Dysuria: Secondary | ICD-10-CM

## 2022-08-31 DIAGNOSIS — N390 Urinary tract infection, site not specified: Secondary | ICD-10-CM

## 2022-08-31 LAB — POCT URINALYSIS DIPSTICK, ED / UC
Bilirubin Urine: NEGATIVE
Glucose, UA: 500 mg/dL — AB
Ketones, ur: NEGATIVE mg/dL
Nitrite: NEGATIVE
Protein, ur: NEGATIVE mg/dL
Specific Gravity, Urine: 1.02 (ref 1.005–1.030)
Urobilinogen, UA: 0.2 mg/dL (ref 0.0–1.0)
pH: 5.5 (ref 5.0–8.0)

## 2022-08-31 MED ORDER — SULFAMETHOXAZOLE-TRIMETHOPRIM 800-160 MG PO TABS: 1.0000 | ORAL_TABLET | Freq: Two times a day (BID) | ORAL | 0 refills | Status: AC

## 2022-08-31 NOTE — ED Triage Notes (Signed)
Pt reports dysuria for several days. No other symptoms to report.

## 2022-08-31 NOTE — ED Provider Notes (Signed)
[place]    CSN: [id number] Arrival date & time: 08/31/22  1345      History   Chief Complaint Chief Complaint  Patient presents with   Dysuria    HPI Brooke Hughes is a 52 y.o. female.   52 year old female, Brooke Hughes, presents to ER with chief complaint of dysuria for a few days. Pt denies any nasuea,vomiting or fever.   The history is provided by the patient. No language interpreter was used.    Past Medical History:  Diagnosis Date   Diabetes mellitus without complication ([place])    Hypertension    Obesity     Patient Active Problem List   Diagnosis Date Noted   Dysuria 08/31/2022   Acute UTI 08/31/2022   Encounter for screening for HIV 05/01/2022   Encounter for hepatitis C screening test for low risk patient 05/01/2022   Vaginal discharge 05/01/2022   Screening for cervical cancer 05/01/2022   History of cerebrovascular accident (CVA) in adulthood 05/01/2022   HTN (hypertension) 08/07/2020   Healthcare maintenance 08/07/2020   Type 2 diabetes mellitus ([place]) 08/02/2020   IDA (iron deficiency anemia) 08/02/2020   Thyroid nodule 08/02/2020   DVT (deep venous thrombosis) ([employer]) 07/29/2020    Past Surgical History:  Procedure Laterality Date   FEMUR FRACTURE SURGERY     r/hip   JOINT REPLACEMENT     PERIPHERAL VASCULAR INTERVENTION Left 08/01/2020   Procedure: PERIPHERAL VASCULAR INTERVENTION;  Surgeon: Marty Heck, MD;  Location: [facility];  Service: Cardiovascular;  Laterality: Left;  common iliac vein   PERIPHERAL VASCULAR THROMBECTOMY N/A 08/01/2020   Procedure: PERIPHERAL VASCULAR THROMBECTOMY - Left Leg;  Surgeon: Marty Heck, MD;  Location: [facility];  Service: Cardiovascular;  Laterality: N/A;    OB History   No obstetric history on file.      Home Medications    Prior to Admission medications   Medication Sig Start Date End Date Taking? Authorizing Provider  amLODipine (NORVASC) 10 MG  tablet Take 10 mg by mouth daily. 12/15/21  Yes [provider]  aspirin 81 MG chewable tablet Chew 81 mg by mouth daily. 09/26/21 09/26/22 Yes [provider]  atorvastatin (LIPITOR) 80 MG tablet Take 80 mg by mouth daily. 03/28/22  Yes [provider]  Blood Glucose Monitoring Suppl (ONETOUCH VERIO) w/Device KIT 1 each by Does not apply route as needed. 05/23/22  Yes Johny Blamer, DO  carvedilol (COREG) 12.5 MG tablet 12.5 mg 2 (two) times daily with a meal. 12/15/21  Yes [provider]  glucose blood (ONETOUCH VERIO) test strip 1 each by Other route 3 (three) times daily. Use as instructed 05/23/22  Yes Johny Blamer, DO  hydrochlorothiazide (HYDRODIURIL) 25 MG tablet Take 1 tablet (25 mg total) by mouth daily. 05/01/22 10/28/22 Yes Leigh Aurora, DO  Insulin Glargine (BASAGLAR KWIKPEN) 100 UNIT/ML Inject 29 Units into the skin daily. 05/02/22 10/29/22 Yes Patel, Hyman Hopes, DO  Insulin Pen Needle (PEN NEEDLES) 32G X 4 MM MISC 1 Units by Does not apply route once a week. 05/22/22  Yes Johny Blamer, DO  Lancets 30G MISC 1 each by Does not apply route every 8 (eight) hours. 05/22/22  Yes Johny Blamer, DO  lisinopril (ZESTRIL) 20 MG tablet Take 1 tablet (20 mg total) by mouth daily. 05/01/22  Yes Leigh Aurora, DO  metFORMIN (GLUCOPHAGE) 500 MG tablet Take 1,000 mg by mouth 2 (two) times daily with a meal. 02/11/22  Yes [provider]  Multiple Vitamin (MULTIVITAMIN WITH MINERALS) TABS tablet Take 1 tablet by mouth daily.   Yes [provider]  Semaglutide,0.25 or 0.5MG /DOS, 2 MG/3ML SOPN Inject 0.5 mg into the skin once a week. 06/02/22 05/28/23 Yes Johny Blamer, DO  senna-docusate (SENOKOT-S) 8.6-50 MG tablet Take 1 tablet by mouth daily. 08/03/20  Yes Sanjuan Dame, MD  sulfamethoxazole-trimethoprim (BACTRIM DS) 800-160 MG tablet Take 1 tablet by mouth 2 (two) times daily for 7 days. 08/31/22 [id number] Yes Kyona Chauncey, Jeanett Schlein, NP    Family  History Family History  Problem Relation Age of Onset   Diabetes Mother    Diabetes Father     Social History Social History   Tobacco Use   Smoking status: Never   Smokeless tobacco: Never  Vaping Use   Vaping Use: Never used  Substance Use Topics   Alcohol use: No   Drug use: No     Allergies   Vicodin [hydrocodone-acetaminophen] and Penicillins   Review of Systems Review of Systems  Constitutional:  Negative for fever.  Gastrointestinal:  Negative for abdominal pain, nausea and vomiting.  Genitourinary:  Positive for dysuria. Negative for flank pain.  All other systems reviewed and are negative.    Physical Exam Triage Vital Signs ED Triage Vitals  Enc Vitals Group     BP 08/31/22 1448 (!) 173/93     Pulse Rate 08/31/22 1447 67     Resp 08/31/22 1447 18     Temp 08/31/22 1447 98.3 F (36.8 C)     Temp Source 08/31/22 1447 Oral     SpO2 08/31/22 1447 100 %     Weight --      Height --      Head Circumference --      Peak Flow --      Pain Score --      Pain Loc --      Pain Edu? --      Excl. in [place]? --    No data found.  Updated Vital Signs BP (!) 173/93   Pulse 67   Temp 98.3 F (36.8 C) (Oral)   Resp 18   SpO2 100%   Visual Acuity Right Eye Distance:   Left Eye Distance:   Bilateral Distance:    Right Eye Near:   Left Eye Near:    Bilateral Near:     Physical Exam Vitals and nursing note reviewed.  Constitutional:      General: Brooke Hughes is not in acute distress.    Appearance: Brooke Hughes is well-developed and well-groomed.  HENT:     Head: Normocephalic and atraumatic.  Eyes:     Conjunctiva/sclera: Conjunctivae normal.  Cardiovascular:     Rate and Rhythm: Normal rate and regular rhythm.     Heart sounds: No murmur heard. Pulmonary:     Effort: Pulmonary effort is normal. No respiratory distress.     Breath sounds: Normal breath sounds.  Abdominal:     General: Abdomen is flat. Bowel sounds are normal.     Palpations: Abdomen is soft.      Tenderness: There is no abdominal tenderness.  Musculoskeletal:        General: No swelling.     Cervical back: Neck supple.  Skin:    General: Skin is warm and dry.     Capillary Refill: Capillary refill takes less than 2 seconds.  Neurological:     General: No focal deficit present.     Mental Status: Brooke Hughes  is alert and oriented to person, place, and time.     GCS: GCS eye subscore is 4. GCS verbal subscore is 5. GCS motor subscore is 6.  Psychiatric:        Attention and Perception: Attention normal.        Mood and Affect: Mood normal.        Speech: Speech normal.        Behavior: Behavior normal. Behavior is cooperative.      UC Treatments / Results  Labs (all labs ordered are listed, but only abnormal results are displayed) Labs Reviewed  POCT URINALYSIS DIPSTICK, ED / UC - Abnormal; Notable for the following components:      Result Value   Glucose, UA 500 (*)    Hgb urine dipstick MODERATE (*)    Leukocytes,Ua TRACE (*)    All other components within normal limits  URINE CULTURE    EKG   Radiology No results found.  Procedures Procedures (including critical care time)  Medications Ordered in UC Medications - No data to display  Initial Impression / Assessment and Plan / UC Course  I have reviewed the triage vital signs and the nursing notes.  Pertinent labs & imaging results that were available during my care of the patient were reviewed by me and considered in my medical decision making (see chart for details).     Ddx: UTI, dysuria Final Clinical Impressions(s) / UC Diagnoses   Final diagnoses:  Dysuria  Acute UTI     Discharge Instructions      Rest,push fluids, take antibiotic as directed. Drink plenty of water. Follow up with PCP for recheck of urine after completing antibiotic. Return as needed.     ED Prescriptions     Medication Sig Dispense Auth. Provider   sulfamethoxazole-trimethoprim (BACTRIM DS) 800-160 MG tablet Take 1  tablet by mouth 2 (two) times daily for 7 days. 14 tablet Elysabeth Aust, Jeanett Schlein, NP      PDMP not reviewed this encounter.   Tori Milks, NP [phone]

## 2022-08-31 NOTE — Discharge Instructions (Addendum)
Rest,push fluids, take antibiotic as directed. Drink plenty of water. Follow up with PCP for recheck of urine after completing antibiotic. Return as needed.

## 2022-09-02 LAB — URINE CULTURE: Culture: 5000 — AB

## 2022-09-03 ENCOUNTER — Telehealth: Payer: Self-pay | Admitting: *Deleted

## 2022-09-03 NOTE — Telephone Encounter (Signed)
Walk-in. Stated she went to UC (08/31/22) and dx with UTI and was told she will need further testing (vaginal). I asked pt about any symptoms - stated she's having burning/itching. No appts today - appt schedule with Dr Jodi Mourning tomorrow 3/21 @ 1015AM.

## 2022-09-04 ENCOUNTER — Encounter: Payer: Self-pay | Admitting: Student

## 2022-09-04 ENCOUNTER — Other Ambulatory Visit (HOSPITAL_COMMUNITY)
Admission: RE | Admit: 2022-09-04 | Discharge: 2022-09-04 | Disposition: A | Discharge status: Home / Self Care | Payer: Commercial Managed Care - HMO | Source: Ambulatory Visit | Attending: Internal Medicine | Admitting: Internal Medicine

## 2022-09-04 ENCOUNTER — Ambulatory Visit (INDEPENDENT_AMBULATORY_CARE_PROVIDER_SITE_OTHER): Payer: Commercial Managed Care - HMO | Admitting: Student

## 2022-09-04 VITALS — BP 161/95 | HR 61 | Temp 98.0°F | Ht 74.0 in | Wt 333.1 lb

## 2022-09-04 DIAGNOSIS — Z59819 Housing instability, housed unspecified: Secondary | ICD-10-CM | POA: Insufficient documentation

## 2022-09-04 DIAGNOSIS — Z794 Long term (current) use of insulin: Secondary | ICD-10-CM

## 2022-09-04 DIAGNOSIS — R300 Dysuria: Secondary | ICD-10-CM

## 2022-09-04 DIAGNOSIS — Z7984 Long term (current) use of oral hypoglycemic drugs: Secondary | ICD-10-CM

## 2022-09-04 DIAGNOSIS — E1165 Type 2 diabetes mellitus with hyperglycemia: Secondary | ICD-10-CM

## 2022-09-04 DIAGNOSIS — Z0000 Encounter for general adult medical examination without abnormal findings: Secondary | ICD-10-CM

## 2022-09-04 DIAGNOSIS — I10 Essential (primary) hypertension: Secondary | ICD-10-CM | POA: Diagnosis not present

## 2022-09-04 DIAGNOSIS — Z1211 Encounter for screening for malignant neoplasm of colon: Secondary | ICD-10-CM

## 2022-09-04 DIAGNOSIS — N898 Other specified noninflammatory disorders of vagina: Secondary | ICD-10-CM | POA: Diagnosis not present

## 2022-09-04 LAB — POCT URINALYSIS DIPSTICK
Bilirubin, UA: NEGATIVE
Glucose, UA: POSITIVE — AB
Ketones, UA: NEGATIVE
Leukocytes, UA: NEGATIVE
Nitrite, UA: NEGATIVE
Protein, UA: NEGATIVE
Spec Grav, UA: >=1.03 — AB (ref 1.010–1.025)
Urobilinogen, UA: 0.2 E.U./dL
pH, UA: 5.5 (ref 5.0–8.0)

## 2022-09-04 LAB — POCT GLYCOSYLATED HEMOGLOBIN (HGB A1C): HbA1c POC (<> result, manual entry): >14 % — AB (ref 4.0–5.6)

## 2022-09-04 LAB — GLUCOSE, CAPILLARY: Glucose-Capillary: 276 mg/dL — ABNORMAL HIGH (ref 70–99)

## 2022-09-04 MED ORDER — SEMAGLUTIDE(0.25 OR 0.5MG/DOS) 2 MG/3ML ~~LOC~~ SOPN: 0.5000 mg | PEN_INJECTOR | SUBCUTANEOUS | 3 refills | Status: DC

## 2022-09-04 MED ORDER — METFORMIN HCL 500 MG PO TABS: 1000.0000 mg | ORAL_TABLET | Freq: Two times a day (BID) | ORAL | 11 refills | Status: DC

## 2022-09-04 NOTE — Assessment & Plan Note (Signed)
Patient has history of hypertension managed with hydrochlorothiazide, amlodipine, carvedilol, and lisinopril. Reports inconsistent medication adherence, skipping about 2-3 doses per week on average. Occasionally experiences blurry vision, headache, and palpitations. Denies lightheadedness and chest pain. Today in clinic, BP 161/95. Discussed importance of medication adherence to control BP and reduce stroke risk, patient expressed understanding. Most recent BMP collected 08-2021.  - Continue amlodipine 10mg  q24, carvedilol 12.5mg  q12, hydrochlorothiazide 25mg  q24, and lisinopril 20mg  q24 - Check BMP

## 2022-09-04 NOTE — Assessment & Plan Note (Signed)
Patient due for colonoscopy, age 35yr.   - Referral to gastroenterology for colonoscopy

## 2022-09-04 NOTE — Assessment & Plan Note (Signed)
Patient reports vaginal itching that started about one week ago. History negative for any recent sexual encounters. She has been itching the area and subsequently developed some burning with urination. Associated symptoms include dysuria, frequency, suprapubic pain, malaise, myalgias, subjective fever, and vaginal discharge. Denies cloudy or red discoloration and urgency. Visited UC on 3-17 for these same symptoms and was prescribed trimethoprim-sulfamethoxazole for seven days duration. Completed several days, but was instructed to discontinue it after urine culture demonstrated evidence of contamination. Exam notable for erythema and excoriations involving labia majora-minora. Vaginal discharge absent, vitals including temperature were normal. Dipstick unremarkable, low suspicion for urinary tract infection. Given symptoms, will check urinalysis with reflex culture. Additionally, will check for STI with cervicovaginal swab.  - Check urinalysis with reflex culture - Check cervicovaginal swab - Treatment pending results

## 2022-09-04 NOTE — Progress Notes (Signed)
CC: Vaginal Itching  HPI:   [name] is a 52 y.o. female with a past medical history of obesity, hypertension, diabetes, and CVA 2023 who presents with vaginal itching. She was last seen at [facility] in 05-2022.    Past Medical History:  Diagnosis Date   Diabetes mellitus without complication ([place])    Hypertension    Obesity      Review of Systems:    Reports dysuria, frequency, suprapubic pain, malaise, myalgias, subjective fever, vaginal discharge, blurry vision, headache, palpitations Denies lightheadedness, chest pain, urgency, urine discoloration    Physical Exam:  Vitals:   09/04/22 1020 09/04/22 1051  BP: (!) 160/95 (!) 161/95  Pulse: 61   Temp: 98.7 F (37.1 C) 98 F (36.7 C)  TempSrc: Oral Oral  SpO2: 98%   Weight: (!) 333 lb 1.6 oz (151.1 kg)   Height: 6\' 2"  (1.88 m)     General:   awake and alert, sitting comfortably in chair, cooperative, not in acute distress Lungs:   normal respiratory effort, breathing unlabored, symmetrical chest rise, no crackles or wheezing Cardiac:   regular rate and rhythm, normal S1 and S2 Genital:   erythema and excoriations involving labia majora-minora, no vaginal discharge Neurologic:   oriented to person-place-time, moving all extremities, no gross focal deficits Psychiatric:   euthymic mood with slightly flat affect, intelligible speech    Assessment & Plan:   Vaginal pruritus Patient reports vaginal itching that started about one week ago. History negative for any recent sexual encounters. She has been itching the area and subsequently developed some burning with urination. Associated symptoms include dysuria, frequency, suprapubic pain, malaise, myalgias, subjective fever, and vaginal discharge. Denies cloudy or red discoloration and urgency. Visited UC on 3-17 for these same symptoms and was prescribed trimethoprim-sulfamethoxazole for seven days duration. Completed several days, but was instructed to  discontinue it after urine culture demonstrated evidence of contamination. Exam notable for erythema and excoriations involving labia majora-minora. Vaginal discharge absent, vitals including temperature were normal. Dipstick unremarkable, low suspicion for urinary tract infection. Given symptoms, will check urinalysis with reflex culture. Additionally, will check for STI with cervicovaginal swab.  - Check urinalysis with reflex culture - Check cervicovaginal swab - Treatment pending results    HTN (hypertension) Patient has history of hypertension managed with hydrochlorothiazide, amlodipine, carvedilol, and lisinopril. Reports inconsistent medication adherence, skipping about 2-3 doses per week on average. Occasionally experiences blurry vision, headache, and palpitations. Denies lightheadedness and chest pain. Today in clinic, BP 161/95. Discussed importance of medication adherence to control BP and reduce stroke risk, patient expressed understanding. Most recent BMP collected 08-2021.  - Continue amlodipine 10mg  q24, carvedilol 12.5mg  q12, hydrochlorothiazide 25mg  q24, and lisinopril 20mg  q24 - Check BMP    Type 2 diabetes mellitus ([place]) Patient has history of diabetes, A1C collected 04-2022 was 15. Prescribed metformin, semaglutide, and insulin glargine. She takes the metformin twice daily, but skips the insulin glargine 2-3x per week. Her previous insurance denied semaglutide and she never actually started this medication. Now has new insurance with Medicaid. Reports occasional blurry vision and headache. Discussed importance of daily adherence, better glycemic control can help alleviate vaginal symptoms.  - Continue metformin 1000mg  q12 - Continue insulin glargine 29U q24 - Start semaglutide 0.5mg  [phone]    Housing situation unstable Patient describes unstable housing situation, which has been source of stress and interferes with medication adherence. She met with social work in  05-2022. Encouraged patient to reach out for any  new needs.  - Encourage social work support as needed    Healthcare maintenance Patient due for colonoscopy, age 6yr.   - Referral to gastroenterology for colonoscopy        See Encounters Tab for problem based charting.  Patient discussed with Dr.  Cain Sieve

## 2022-09-04 NOTE — Assessment & Plan Note (Signed)
Patient has history of diabetes, A1C collected 04-2022 was 15. Prescribed metformin, semaglutide, and insulin glargine. She takes the metformin twice daily, but skips the insulin glargine 2-3x per week. Her previous insurance denied semaglutide and she never actually started this medication. Now has new insurance with Medicaid. Reports occasional blurry vision and headache. Discussed importance of daily adherence, better glycemic control can help alleviate vaginal symptoms.  - Continue metformin 1000mg  q12 - Continue insulin glargine 29U q24 - Start semaglutide 0.5mg  [phone]

## 2022-09-04 NOTE — Assessment & Plan Note (Signed)
Patient describes unstable housing situation, which has been source of stress and interferes with medication adherence. She met with social work in 05-2022. Encouraged patient to reach out for any new needs.  - Encourage social work support as needed

## 2022-09-04 NOTE — Patient Instructions (Signed)
  Thank you, [name], for allowing [place] to provide your care today. Today we discussed . . .  > Vaginal Itching       - we are checking some lab tests including urine and vaginal swab to determine the cause of your symptoms       - we will call you with the results and start a medication if needed  > Hypertension       - please start taking your blood pressure medications more regularly without skipping any doses       - we are ordering some lab tests and will call you with the results  > Diabetes       - please start taking your insulin and diabetes medications more regularly without skipping any doses       - controlling your blood pressure and diabetes will help prevent another stroke   I have ordered the following labs for you:  Lab Orders         Glucose, capillary         BMP8+Anion Gap         POCT Urinalysis Dipstick (81002)         POC Hbg A1C       Tests ordered today:  none   Referrals ordered today:   Referral Orders         Ambulatory referral to Gastroenterology       I have ordered the following medication/changed the following medications:   Stop the following medications: Medications Discontinued During This Encounter  Medication Reason   metFORMIN (GLUCOPHAGE) 500 MG tablet Reorder   Semaglutide,0.25 or 0.5MG /DOS, 2 MG/3ML SOPN Reorder     Start the following medications: Meds ordered this encounter  Medications   metFORMIN (GLUCOPHAGE) 500 MG tablet    Sig: Take 2 tablets (1,000 mg total) by mouth 2 (two) times daily with a meal.    Dispense:  120 tablet    Refill:  11   Semaglutide,0.25 or 0.5MG /DOS, 2 MG/3ML SOPN    Sig: Inject 0.5 mg into the skin once a week.    Dispense:  3 mL    Refill:  3      Follow up:  1 month     Remember:  Please start taking your blood pressure and diabetes medications regularly without skipping any doses. We are checking some lab tests and will call you with the results, you may need a medication  to treat an infection. Return to clinic in about one month!   Should you have any questions or concerns please call the internal medicine clinic at [phone].     Roswell Nickel, MD [place]

## 2022-09-05 LAB — BMP8+ANION GAP
Anion Gap: 14 mmol/L (ref 10.0–18.0)
BUN/Creatinine Ratio: 9 (ref 9–23)
BUN: 8 mg/dL (ref 6–24)
CO2: 23 mmol/L (ref 20–29)
Calcium: 9.7 mg/dL (ref 8.7–10.2)
Chloride: 101 mmol/L (ref 96–106)
Creatinine, Ser: 0.86 mg/dL (ref 0.57–1.00)
Glucose: 261 mg/dL — ABNORMAL HIGH (ref 70–99)
Potassium: 4.1 mmol/L (ref 3.5–5.2)
Sodium: 138 mmol/L (ref 134–144)
eGFR: 83 mL/min/{1.73_m2} (ref >59)

## 2022-09-05 LAB — CERVICOVAGINAL ANCILLARY ONLY
Bacterial Vaginitis (gardnerella): NEGATIVE
Candida Glabrata: POSITIVE — AB
Candida Vaginitis: POSITIVE — AB
Chlamydia: NEGATIVE
Comment: NEGATIVE
Comment: NEGATIVE
Comment: NEGATIVE
Comment: NEGATIVE
Comment: NEGATIVE
Comment: NORMAL
Neisseria Gonorrhea: NEGATIVE
Trichomonas: NEGATIVE

## 2022-09-05 LAB — MICROSCOPIC EXAMINATION
Casts: NONE SEEN /lpf
RBC, Urine: >30 /hpf — AB (ref 0–2)

## 2022-09-05 LAB — UA/M W/RFLX CULTURE, COMP
Bilirubin, UA: NEGATIVE
Ketones, UA: NEGATIVE
Leukocytes,UA: NEGATIVE
Nitrite, UA: NEGATIVE
Protein,UA: NEGATIVE
Specific Gravity, UA: >=1.03 — AB (ref 1.005–1.030)
Urobilinogen, Ur: 0.2 mg/dL (ref 0.2–1.0)
pH, UA: 5 (ref 5.0–7.5)

## 2022-09-17 ENCOUNTER — Other Ambulatory Visit: Payer: Self-pay | Admitting: Student

## 2022-09-17 MED ORDER — FLUCONAZOLE 100 MG PO TABS: 150.0000 mg | ORAL_TABLET | Freq: Once | ORAL | 0 refills | Status: AC

## 2022-09-19 ENCOUNTER — Telehealth: Payer: Self-pay

## 2022-09-19 NOTE — Telephone Encounter (Signed)
Prior Authorization for patient (ozempic) came through on cover my meds was submitted with last office notes and labs awaiting approval or denial 

## 2022-09-19 NOTE — Progress Notes (Signed)
Internal Medicine Clinic Attending  Case discussed with Dr. Harper  At the time of the visit.  We reviewed the resident's history and exam and pertinent patient test results.  I agree with the assessment, diagnosis, and plan of care documented in the resident's note.  

## 2022-09-22 NOTE — Telephone Encounter (Signed)
Decision:Denied Brooke Hughes (Key: [id number]) Ozempic (0.25 or 0.5 MG/DOSE) 2MG /3ML pen-injectors Form [employer] Electronic PA Form (2017 NCPDP)  Message from Plan [id number];RAQTMA:[id number];Review Type:;Appeal Information: Attention:NATIONAL APPEALS UNIT [employer] [address],[place],[address]; Important - Please read the below note on eAppeals: Please reference the denial letter for information on the rights for an appeal, rationale for the denial, and how to submit an appeal including if any information is needed to support the appeal. Note about urgent situations - Generally, an urgent situation is one which, in the opinion of the provider, the health of the patient may be in serious jeopardy or may experience pain that cannot be adequately controlled while waiting for a decision on the appeal.;

## 2022-10-06 ENCOUNTER — Ambulatory Visit (INDEPENDENT_AMBULATORY_CARE_PROVIDER_SITE_OTHER): Payer: Commercial Managed Care - HMO | Admitting: Student

## 2022-10-06 ENCOUNTER — Encounter: Payer: Self-pay | Admitting: Student

## 2022-10-06 VITALS — BP 157/96 | HR 67 | Temp 97.8°F | Ht 74.0 in | Wt 324.3 lb

## 2022-10-06 DIAGNOSIS — Z7984 Long term (current) use of oral hypoglycemic drugs: Secondary | ICD-10-CM

## 2022-10-06 DIAGNOSIS — E1165 Type 2 diabetes mellitus with hyperglycemia: Secondary | ICD-10-CM | POA: Diagnosis not present

## 2022-10-06 DIAGNOSIS — Z8673 Personal history of transient ischemic attack (TIA), and cerebral infarction without residual deficits: Secondary | ICD-10-CM

## 2022-10-06 DIAGNOSIS — Z794 Long term (current) use of insulin: Secondary | ICD-10-CM

## 2022-10-06 DIAGNOSIS — I10 Essential (primary) hypertension: Secondary | ICD-10-CM

## 2022-10-06 MED ORDER — METFORMIN HCL 1000 MG PO TABS: 1000.0000 mg | ORAL_TABLET | Freq: Two times a day (BID) | ORAL | 3 refills | Status: DC

## 2022-10-06 MED ORDER — BASAGLAR KWIKPEN 100 UNIT/ML ~~LOC~~ SOPN: 29.0000 [IU] | PEN_INJECTOR | Freq: Every day | SUBCUTANEOUS | 5 refills | Status: DC

## 2022-10-06 MED ORDER — OLMESARTAN-AMLODIPINE-HCTZ 40-10-25 MG PO TABS: 1.0000 | ORAL_TABLET | Freq: Every day | ORAL | 2 refills | Status: DC

## 2022-10-06 MED ORDER — ATORVASTATIN CALCIUM 80 MG PO TABS: 80.0000 mg | ORAL_TABLET | Freq: Every day | ORAL | 3 refills | Status: DC

## 2022-10-06 NOTE — Patient Instructions (Signed)
Ms. Milson,  It was a pleasure seeing you in the clinic today.   As we discussed, it will be very important to take your medications every day as prescribed. I have sent refills on all of them to the [facility] on [address]. We are combining your blood pressure medications into one pill. Please pick this up and take 1 tablet every day. Please take your insulin (29 units) once a day every day. You got your foot exam done today. Please come back to see [place] in 1 month.  Please call our clinic at [phone] if you have any questions or concerns. The best time to call is Monday-Friday from 9am-4pm, but there is someone available 24/7 at the same number. If you need medication refills, please notify your pharmacy one week in advance and they will send [place] a request.   Thank you for letting us take part in your care. We look forward to seeing you next time!

## 2022-10-06 NOTE — Progress Notes (Signed)
   CC: f/u HTN, T2DM  HPI:  [name] is a 52 y.o. female with history listed below presenting to the [facility] for f/u HTN, T2DM. Please see individualized problem based charting for full HPI.  Past Medical History:  Diagnosis Date   Diabetes mellitus without complication    Encounter for hepatitis C screening test for low risk patient 05/01/2022   Encounter for screening for HIV 05/01/2022   Hypertension    Obesity     Review of Systems:  Negative aside from that listed in individualized problem based charting.  Physical Exam:  Vitals:   10/06/22 1107  BP: (!) 149/105  Pulse: 73  Temp: 97.8 F (36.6 C)  TempSrc: Oral  SpO2: 100%  Weight: (!) 324 lb 4.8 oz (147.1 kg)  Height:  (1.88 m)   Physical Exam Constitutional:      Appearance: Normal appearance. She is obese. She is not ill-appearing.  HENT:     Mouth/Throat:     Mouth: Mucous membranes are moist.     Pharynx: Oropharynx is clear. No oropharyngeal exudate.  Eyes:     General: No scleral icterus.    Extraocular Movements: Extraocular movements intact.     Conjunctiva/sclera: Conjunctivae normal.     Pupils: Pupils are equal, round, and reactive to light.  Cardiovascular:     Rate and Rhythm: Normal rate and regular rhythm.     Heart sounds: Normal heart sounds. No murmur heard.    No friction rub. No gallop.  Pulmonary:     Effort: Pulmonary effort is normal.     Breath sounds: Normal breath sounds. No wheezing, rhonchi or rales.  Abdominal:     General: Bowel sounds are normal. There is no distension.     Palpations: Abdomen is soft.     Tenderness: There is no abdominal tenderness.  Musculoskeletal:        General: No swelling. Normal range of motion.  Skin:    General: Skin is warm and dry.  Neurological:     Mental Status: She is alert and oriented to person, place, and time. Mental status is at baseline.  Psychiatric:        Mood and Affect: Mood normal.        Behavior: Behavior  normal.      Assessment & Plan:   See Encounters Tab for problem based charting.  Patient discussed with Dr.  Lafonda Mosses

## 2022-10-06 NOTE — Assessment & Plan Note (Signed)
Patient with history of uncontrolled T2DM. A1c last month >14%. She is currently on metformin  BID and basaglar 29u daily. She states that she takes metformin on most days but is taking her insulin once weekly. We discussed importance of adherence with insulin for better glycemic control. States that she wants to take it daily but unable to state why she has not yet done so. She has enough insulin at home and does not have any troubles with obtaining insulin. Her glucometer reveals largely hyperglycemia with 2 days of good glycemic control, presumably on the days that she took her basaglar. In order to reduce medication burden, we have optimized her medications. Reemphasized importance of daily insulin therapy which she confirms understanding. Foot exam performed today.  Plan: -continue metformin  BID -continue basaglar 29u daily -foot exam performed today -f/u in 1 month, repeat A1c in 2 months -continue counseling on medication adherence moving forward

## 2022-10-06 NOTE — Assessment & Plan Note (Addendum)
Patient living with uncontrolled HTN primarily due to medication nonadherence. BP in clinic 149/105 with repeat 156/95. She is currently on norvasc  daily, HCTZ  daily, lisinopril  daily, and coreg 12.5mg  BID. States that she typically takes them a couple of times a week. Lengthy discussion on importance of medication adherence especially given history of prior ischemic stroke in March 2023. She is able to understand the importance of antihypertensive therapy but unable to clearly state why she does not take them daily. She does think that she is on too many medications and this may be impacting her adherence. We discussed decreasing her pill burden by combining medications and she is agreeable. Will stop coreg (does not have good adherence with BID dosing). Will switch from lisinopril to olmesartan in order to combine antihypertensive regimen into olmesartan-amlodipine-HCTZ 40-10-25mg  daily.  Plan: -stop coreg and lisinopril -start olmesartan-amlodipine-hctz combination pill  -f/u in 1 month for BP recheck

## 2022-10-07 NOTE — Progress Notes (Signed)
Internal Medicine Clinic Attending  Case discussed with Dr. Jinwala  At the time of the visit.  We reviewed the resident's history and exam and pertinent patient test results.  I agree with the assessment, diagnosis, and plan of care documented in the resident's note.  

## 2022-11-03 ENCOUNTER — Ambulatory Visit (INDEPENDENT_AMBULATORY_CARE_PROVIDER_SITE_OTHER): Payer: Commercial Managed Care - HMO | Admitting: Student

## 2022-11-03 ENCOUNTER — Other Ambulatory Visit (HOSPITAL_COMMUNITY): Payer: Self-pay

## 2022-11-03 ENCOUNTER — Encounter: Payer: Self-pay | Admitting: Student

## 2022-11-03 VITALS — BP 161/97 | HR 66 | Ht 74.0 in | Wt 326.2 lb

## 2022-11-03 DIAGNOSIS — Z7984 Long term (current) use of oral hypoglycemic drugs: Secondary | ICD-10-CM | POA: Diagnosis not present

## 2022-11-03 DIAGNOSIS — I10 Essential (primary) hypertension: Secondary | ICD-10-CM | POA: Diagnosis not present

## 2022-11-03 DIAGNOSIS — M25512 Pain in left shoulder: Secondary | ICD-10-CM | POA: Diagnosis not present

## 2022-11-03 DIAGNOSIS — E1165 Type 2 diabetes mellitus with hyperglycemia: Secondary | ICD-10-CM | POA: Diagnosis not present

## 2022-11-03 DIAGNOSIS — Z1211 Encounter for screening for malignant neoplasm of colon: Secondary | ICD-10-CM | POA: Insufficient documentation

## 2022-11-03 DIAGNOSIS — Z794 Long term (current) use of insulin: Secondary | ICD-10-CM

## 2022-11-03 MED ORDER — IBUPROFEN 200 MG PO TABS
200.0000 mg | ORAL_TABLET | Freq: Three times a day (TID) | ORAL | 0 refills | Status: DC | PRN
  Filled 2022-11-03: qty 21, 7d supply, fill #0

## 2022-11-03 MED ORDER — IBUPROFEN 200 MG PO TABS: 200.0000 mg | ORAL_TABLET | Freq: Three times a day (TID) | ORAL | 0 refills | Status: AC | PRN

## 2022-11-03 NOTE — Assessment & Plan Note (Signed)
Patient endorses 2-week history of left shoulder pain.  She states that this pain comes and goes.  It gets worse with movement.  She describes it as a cramping pain.  She also states she gets spasms at times.  She denies any fevers or chills. She states she has tried exercises that helped a little bit.  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  On exam, patient has intact strength to all ranges of motion.  She does have some pain with internal rotation on resistance in her upper arm.  She could have a supraspinatus tear.  Patient has full range of motion active and passive.  Will start with supportive care at this time.  Plan: -Short course of ibuprofen provide -Shoulder exercises provided -Will follow-up in 1 month

## 2022-11-03 NOTE — Assessment & Plan Note (Signed)
Patient referred for colonoscopy 

## 2022-11-03 NOTE — Progress Notes (Signed)
CC: Hypertension, diabetes follow-up  HPI:  [name] is a 52 y.o. female with a past medical history of hypertension, type 2 diabetes who presents for follow-up.  Please see assessment and plan for full HPI.  Hypertension: Olmesartan-amlodipine-HCTZ 40-10-25 mg daily Type 2 diabetes: Atorvastatin 80 mg daily, Basaglar 29 units daily, metformin 1000 mg twice daily Hyperlipidemia: Atorvastatin 80 mg daily  Had conversation with daughter via phone call who states the patient has not been compliant with her medication.  The patient thinks that the only time she is to take her medicine is when she is feeling bad.  The daughter does go over some time to administer her insulin, but cannot go every night.  Daughter does report that she tries to put her medications and organizes every week, but daughter does not report that the patient takes the medication as she is not there.  Daughter states that her and her brother have been trying, but they are not able to give support 24/7.  Past Medical History:  Diagnosis Date   Diabetes mellitus without complication (HCC)    Encounter for hepatitis C screening test for low risk patient 05/01/2022   Encounter for screening for HIV 05/01/2022   Hypertension    Obesity      Current Outpatient Medications:    atorvastatin (LIPITOR) 80 MG tablet, Take 1 tablet (80 mg total) by mouth daily., Disp: 90 tablet, Rfl: 3   Blood Glucose Monitoring Suppl (ONETOUCH VERIO) w/Device KIT, 1 each by Does not apply route as needed., Disp: 1 kit, Rfl: 0   glucose blood (ONETOUCH VERIO) test strip, 1 each by Other route 3 (three) times daily. Use as instructed, Disp: 100 each, Rfl: 12   ibuprofen (ADVIL) 200 MG tablet, Take 1 tablet (200 mg total) by mouth every 8 (eight) hours as needed for up to 7 days for mild pain., Disp: 21 tablet, Rfl: 0   Insulin Glargine (BASAGLAR KWIKPEN) 100 UNIT/ML, Inject 29 Units into the skin daily., Disp: 8.7 mL, Rfl: 5   Insulin  Pen Needle (PEN NEEDLES) 32G X 4 MM MISC, 1 Units by Does not apply route once a week., Disp: 100 each, Rfl: 11   Lancets 30G MISC, 1 each by Does not apply route every 8 (eight) hours., Disp: 100 each, Rfl: 11   metFORMIN (GLUCOPHAGE) 1000 MG tablet, Take 1 tablet (1,000 mg total) by mouth 2 (two) times daily with a meal., Disp: 180 tablet, Rfl: 3   Olmesartan-amLODIPine-HCTZ 40-10-25 MG TABS, Take 1 tablet by mouth daily at 6 (six) AM., Disp: 90 tablet, Rfl: 2  Review of Systems:    MSK: Patient endorses left shoulder pain  Physical Exam:  Vitals:   11/03/22 1009 11/03/22 1011  BP: (!) 149/96 (!) 161/97  Pulse: 71 66  SpO2: 99%   Weight: (!) 326 lb 3.2 oz (148 kg)   Height: 6\' 2"  (1.88 m)     General: Patient is sitting comfortably in the room  Head: Normocephalic, atraumatic  Cardio: Regular rate and rhythm, no murmurs, rubs or gallops. 2+ pulses to bilateral upper and lower extremities  Pulmonary: Clear to ausculation bilaterally with no rales, rhonchi, and crackles  MSK: 5/5 strength to upper extremities bilaterally on shoulder flexion, extension, abduction, adduction, internal rotation, external rotation.  No bony abnormalities appreciated on left upper extremity.  No rash appreciated on left upper extremity.  No underlying abscess appreciated on left upper extremity.  Full range of motion active and passive to  left upper extremity.  Negative empty can test.  Negative liftoff test.  There is some pain with internal rotation of left upper extremity on resistance.   Assessment & Plan:   Type 2 diabetes mellitus (HCC) Patient is still having sugars that she states are above 200s. She notes that she is taking 29 units of basaglar nightly and metformin 1000 mg BID. There is some concerns that patient is not compliant with medication.  I spoke to daughter on the phone, who states that she tries to come over and put pills into an organizer for the patient, but patient does not take the  medication.  Patient's daughter also states the patient does not take her insulin daily.  Patient's daughter states that the patient only takes medication when she feels like it.  Patient daughter and patient's son has been trying to help patient, but they are busy with their own lives, and they are not able to take care of of her 24 hours of the day.  Patient states that her sugars are in the 200s.  At this time, I am unclear if patient is able to take her medication or not, and therefore did not want to titrate patient's insulin given that she is noncompliant.  I think at this time, patient needs help at home for medication management as patient is unable to do so on her own and she has limited help from family. A1c >14 09/04/2022  Plan: -Continue Basaglar 29 units nightly -Continue metformin 1000 mg twice daily -Home health RN ordered  HTN (hypertension) Patient uncontrolled hypertension.  Patient is to be on olmesartan-HCTZ-amlodipine daily.  Per daughter, patient has not been compliant.  Patient is unable to tell me which medication she is taking.  She also reports she is still taking carvedilol.  Patient is not checking blood pressure at home.  Blood pressure elevated today into the 160s.  Patient denies any chest pain, shortness of breath, vision changes.  She denies any headache.  Per daughter, patient does not take medication as prescribed and only takes medications when she feels like it.  Will need to get better education of patient.  I am concerned that she does not have much comprehension and struggles with this. Patient is able to read back all of her instructions to me. I think home health nurse will likely benefit patient.  Plan: -Continue olmesartan-amlodipine-HCTZ 40-10-25 mg tablet daily -Follow-up in 1 month for hypertension -Home health RN ordered  Left shoulder pain Patient endorses 2-week history of left shoulder pain.  She states that this pain comes and goes.  It gets worse  with movement.  She describes it as a cramping pain.  She also states she gets spasms at times.  She denies any fevers or chills. She states she has tried exercises that helped a little bit.  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  On exam, patient has intact strength to all ranges of motion.  She does have some pain with internal rotation on resistance in her upper arm.  She could have a supraspinatus tear.  Patient has full range of motion active and passive.  Will start with supportive care at this time.  Plan: -Short course of ibuprofen provide -Shoulder exercises provided -Will follow-up in 1 month  Patient discussed with Dr. Marquita Palms, DO PGY-1 Internal Medicine Resident  Pager: [phone]

## 2022-11-03 NOTE — Assessment & Plan Note (Addendum)
Patient is still having sugars that she states are above 200s. She notes that she is taking 29 units of basaglar nightly and metformin 1000 mg BID. There is some concerns that patient is not compliant with medication.  I spoke to daughter on the phone, who states that she tries to come over and put pills into an organizer for the patient, but patient does not take the medication.  Patient's daughter also states the patient does not take her insulin daily.  Patient's daughter states that the patient only takes medication when she feels like it.  Patient daughter and patient's son has been trying to help patient, but they are busy with their own lives, and they are not able to take care of of her 24 hours of the day.  Patient states that her sugars are in the 200s.  At this time, I am unclear if patient is able to take her medication or not, and therefore did not want to titrate patient's insulin given that she is noncompliant.  I think at this time, patient needs help at home for medication management as patient is unable to do so on her own and she has limited help from family. A1c >14 09/04/2022  Plan: -Continue Basaglar 29 units nightly -Continue metformin 1000 mg twice daily -Home health RN ordered

## 2022-11-03 NOTE — Assessment & Plan Note (Addendum)
Patient uncontrolled hypertension.  Patient is to be on olmesartan-HCTZ-amlodipine daily.  Per daughter, patient has not been compliant.  Patient is unable to tell me which medication she is taking.  She also reports she is still taking carvedilol.  Patient is not checking blood pressure at home.  Blood pressure elevated today into the 160s.  Patient denies any chest pain, shortness of breath, vision changes.  She denies any headache.  Per daughter, patient does not take medication as prescribed and only takes medications when she feels like it.  Will need to get better education of patient.  I am concerned that she does not have much comprehension and struggles with this. Patient is able to read back all of her instructions to me. I think home health nurse will likely benefit patient.  Plan: -Continue olmesartan-amlodipine-HCTZ 40-10-25 mg tablet daily -Follow-up in 1 month for hypertension -Home health RN ordered

## 2022-11-04 NOTE — Progress Notes (Signed)
Internal Medicine Clinic Attending  Case discussed with Dr. Allena Katz  At the time of the visit.  We reviewed the resident's history and exam and pertinent patient test results.  I agree with the assessment, diagnosis, and plan of care documented in the resident's note.  Brooke Hughes is most probably not taking her meds.  A review of chart including Care Everywhere suggests the same pattern of  uncontrolled chronic conditions, medications not being refilled, etc. She has been provided medication boxes in the past.  The degree of poor understanding of her meds is very concerning for such a young person, and it would behoove [place] to investigate her literacy and cognition.  If a problem with cognition is identified, a neurology evaluation or neuropsych testing would be most appropriate.  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

## 2022-11-12 ENCOUNTER — Encounter: Payer: Self-pay | Admitting: Gastroenterology

## 2022-11-19 ENCOUNTER — Ambulatory Visit (AMBULATORY_SURGERY_CENTER): Payer: Commercial Managed Care - HMO

## 2022-11-19 VITALS — Ht 74.0 in | Wt 331.0 lb

## 2022-11-19 DIAGNOSIS — Z1211 Encounter for screening for malignant neoplasm of colon: Secondary | ICD-10-CM

## 2022-11-19 MED ORDER — NA SULFATE-K SULFATE-MG SULF 17.5-3.13-1.6 GM/177ML PO SOLN: 1.0000 | Freq: Once | ORAL | 0 refills | Status: AC

## 2022-11-19 NOTE — Progress Notes (Signed)
No egg or soy allergy known to patient  No issues known to pt with past sedation with any surgeries or procedures Patient denies ever being told they had issues or difficulty with intubation  No FH of Malignant Hyperthermia Pt is not on diet pills Pt is not on  home 02  Pt is not on blood thinners  Pt denies issues with constipation  No A fib or A flutter Have any cardiac testing pending-- no  Pt is ambulatory    PV completed. Prep instructions reviewed and sent to home address. Good rx coupon for [facility] provided Pt instructed to use Singlecare.com or GoodRx for a price reduction on prep

## 2022-11-27 ENCOUNTER — Telehealth: Payer: Self-pay

## 2022-12-01 ENCOUNTER — Encounter: Payer: Self-pay | Admitting: Certified Registered Nurse Anesthetist

## 2022-12-02 ENCOUNTER — Encounter: Payer: Self-pay | Admitting: Gastroenterology

## 2022-12-02 ENCOUNTER — Ambulatory Visit (AMBULATORY_SURGERY_CENTER): Payer: Commercial Managed Care - HMO | Admitting: Gastroenterology

## 2022-12-02 VITALS — BP 146/86 | HR 66 | Temp 96.2°F | Resp 14 | Ht 74.0 in | Wt 331.0 lb

## 2022-12-02 DIAGNOSIS — Z1211 Encounter for screening for malignant neoplasm of colon: Secondary | ICD-10-CM | POA: Diagnosis present

## 2022-12-02 DIAGNOSIS — D124 Benign neoplasm of descending colon: Secondary | ICD-10-CM

## 2022-12-02 MED ORDER — SODIUM CHLORIDE 0.9 % IV SOLN: 500.0000 mL | Freq: Once | INTRAVENOUS | Status: DC

## 2022-12-02 NOTE — Progress Notes (Signed)
Report given to PACU, vss 

## 2022-12-02 NOTE — Progress Notes (Signed)
Pt's states no medical or surgical changes since previsit or office visit. 

## 2022-12-02 NOTE — Progress Notes (Signed)
Called to room to assist during endoscopic procedure.  Patient ID and intended procedure confirmed with present staff. Received instructions for my participation in the procedure from the performing physician.  

## 2022-12-02 NOTE — Patient Instructions (Addendum)
   Handouts on polyps & hemorrhoids given to you today.   Await pathology results on polyps removed   High fiber diet   Use FiberCon 1-2 tabs by mouth daily ( over the counter)     YOU HAD AN ENDOSCOPIC PROCEDURE TODAY AT THE [facility]:   Refer to the procedure report that was given to you for any specific questions about what was found during the examination.  If the procedure report does not answer your questions, please call your gastroenterologist to clarify.  If you requested that your care partner not be given the details of your procedure findings, then the procedure report has been included in a sealed envelope for you to review at your convenience later.  YOU SHOULD EXPECT: Some feelings of bloating in the abdomen. Passage of more gas than usual.  Walking can help get rid of the air that was put into your GI tract during the procedure and reduce the bloating. If you had a lower endoscopy (such as a colonoscopy or flexible sigmoidoscopy) you may notice spotting of blood in your stool or on the toilet paper. If you underwent a bowel prep for your procedure, you may not have a normal bowel movement for a few days.  Please Note:  You might notice some irritation and congestion in your nose or some drainage.  This is from the oxygen used during your procedure.  There is no need for concern and it should clear up in a day or so.  SYMPTOMS TO REPORT IMMEDIATELY:  Following lower endoscopy (colonoscopy or flexible sigmoidoscopy):  Excessive amounts of blood in the stool  Significant tenderness or worsening of abdominal pains  Swelling of the abdomen that is new, acute  Fever of 100F or higher  For urgent or emergent issues, a gastroenterologist can be reached at any hour by calling [phone]. Do not use MyChart messaging for urgent concerns.    DIET:  We do recommend a small meal at first, but then you may proceed to your regular diet.  Drink plenty of fluids but  you should avoid alcoholic beverages for 24 hours.  ACTIVITY:  You should plan to take it easy for the rest of today and you should NOT DRIVE or use heavy machinery until tomorrow (because of the sedation medicines used during the test).    FOLLOW UP: Our staff will call the number listed on your records the next business day following your procedure.  We will call around 7:15- 8:00 am to check on you and address any questions or concerns that you may have regarding the information given to you following your procedure. If we do not reach you, we will leave a message.     If any biopsies were taken you will be contacted by phone or by letter within the next 1-3 weeks.  Please call us at [phone] if you have not heard about the biopsies in 3 weeks.    SIGNATURES/CONFIDENTIALITY: You and/or your care partner have signed paperwork which will be entered into your electronic medical record.  These signatures attest to the fact that that the information above on your After Visit Summary has been reviewed and is understood.  Full responsibility of the confidentiality of this discharge information lies with you and/or your care-partner.

## 2022-12-02 NOTE — Progress Notes (Signed)
GASTROENTEROLOGY PROCEDURE H&P NOTE   Primary Care Physician: Modena Slater, DO  HPI: Brooke Hughes is a 52 y.o. female who presents for Colonoscopy for screening.  Past Medical History:  Diagnosis Date   Diabetes mellitus without complication (HCC)    Encounter for hepatitis C screening test for low risk patient 05/01/2022   Encounter for screening for HIV 05/01/2022   Hypertension    Obesity    Stroke (HCC) 08/31/2021   Past Surgical History:  Procedure Laterality Date   FEMUR FRACTURE SURGERY     r/hip   JOINT REPLACEMENT     PERIPHERAL VASCULAR INTERVENTION Left 08/01/2020   Procedure: PERIPHERAL VASCULAR INTERVENTION;  Surgeon: Cephus Shelling, MD;  Location: [facility];  Service: Cardiovascular;  Laterality: Left;  common iliac vein   PERIPHERAL VASCULAR THROMBECTOMY N/A 08/01/2020   Procedure: PERIPHERAL VASCULAR THROMBECTOMY - Left Leg;  Surgeon: Cephus Shelling, MD;  Location: [facility];  Service: Cardiovascular;  Laterality: N/A;   Current Outpatient Medications  Medication Sig Dispense Refill   atorvastatin (LIPITOR) 80 MG tablet Take 1 tablet (80 mg total) by mouth daily. 90 tablet 3   Insulin Glargine (BASAGLAR KWIKPEN) 100 UNIT/ML Inject 29 Units into the skin daily. 8.7 mL 5   Insulin Pen Needle (PEN NEEDLES) 32G X 4 MM MISC 1 Units by Does not apply route once a week. 100 each 11   metFORMIN (GLUCOPHAGE) 1000 MG tablet Take 1 tablet (1,000 mg total) by mouth 2 (two) times daily with a meal. 180 tablet 3   Olmesartan-amLODIPine-HCTZ 40-10-25 MG TABS Take 1 tablet by mouth daily at 6 (six) AM. 90 tablet 2   Blood Glucose Monitoring Suppl (ONETOUCH VERIO) w/Device KIT 1 each by Does not apply route as needed. 1 kit 0   glucose blood (ONETOUCH VERIO) test strip 1 each by Other route 3 (three) times daily. Use as instructed 100 each 12   HYDROcodone-acetaminophen (NORCO/VICODIN) 5-325 MG tablet Take 1 tablet by mouth every 6 (six) hours as  needed.     Lancets 30G MISC 1 each by Does not apply route every 8 (eight) hours. 100 each 11   Current Facility-Administered Medications  Medication Dose Route Frequency Provider Last Rate Last Admin   0.9 %  sodium chloride infusion  500 mL Intravenous Once Mansouraty, Netty Starring., MD        Current Outpatient Medications:    atorvastatin (LIPITOR) 80 MG tablet, Take 1 tablet (80 mg total) by mouth daily., Disp: 90 tablet, Rfl: 3   Insulin Glargine (BASAGLAR KWIKPEN) 100 UNIT/ML, Inject 29 Units into the skin daily., Disp: 8.7 mL, Rfl: 5   Insulin Pen Needle (PEN NEEDLES) 32G X 4 MM MISC, 1 Units by Does not apply route once a week., Disp: 100 each, Rfl: 11   metFORMIN (GLUCOPHAGE) 1000 MG tablet, Take 1 tablet (1,000 mg total) by mouth 2 (two) times daily with a meal., Disp: 180 tablet, Rfl: 3   Olmesartan-amLODIPine-HCTZ 40-10-25 MG TABS, Take 1 tablet by mouth daily at 6 (six) AM., Disp: 90 tablet, Rfl: 2   Blood Glucose Monitoring Suppl (ONETOUCH VERIO) w/Device KIT, 1 each by Does not apply route as needed., Disp: 1 kit, Rfl: 0   glucose blood (ONETOUCH VERIO) test strip, 1 each by Other route 3 (three) times daily. Use as instructed, Disp: 100 each, Rfl: 12   HYDROcodone-acetaminophen (NORCO/VICODIN) 5-325 MG tablet, Take 1 tablet by mouth every 6 (six) hours as needed., Disp: ,  Rfl:    Lancets 30G MISC, 1 each by Does not apply route every 8 (eight) hours., Disp: 100 each, Rfl: 11  Current Facility-Administered Medications:    0.9 %  sodium chloride infusion, 500 mL, Intravenous, Once, Mansouraty, Netty Starring., MD Allergies  Allergen Reactions   Codeine     Other Reaction(s): Other (See Comments), Unknown   Vicodin [Hydrocodone-Acetaminophen] Hives   Penicillins Hives and Rash   Family History  Problem Relation Age of Onset   Diabetes Mother    Diabetes Father    Colon cancer Neg Hx    Colon polyps Neg Hx    Esophageal cancer Neg Hx    Rectal cancer Neg Hx    Stomach  cancer Neg Hx    Social History   Socioeconomic History   Marital status: Married    Spouse name: Not on file   Number of children: Not on file   Years of education: Not on file   Highest education level: Not on file  Occupational History   Not on file  Tobacco Use   Smoking status: Never   Smokeless tobacco: Never  Vaping Use   Vaping Use: Never used  Substance and Sexual Activity   Alcohol use: No   Drug use: No   Sexual activity: Not on file  Other Topics Concern   Not on file  Social History Narrative   Not on file   Social Determinants of Health   Financial Resource Strain: Not on file  Food Insecurity: No Food Insecurity (05/22/2022)   Hunger Vital Sign    Worried About Running Out of Food in the Last Year: Never true    Ran Out of Food in the Last Year: Never true  Transportation Needs: No Transportation Needs (09/04/2022)   PRAPARE - Administrator, Civil Service (Medical): No    Lack of Transportation (Non-Medical): No  Physical Activity: Not on file  Stress: Not on file  Social Connections: Socially Isolated (05/22/2022)   Social Connection and Isolation Panel [NHANES]    Frequency of Communication with Friends and Family: Never    Frequency of Social Gatherings with Friends and Family: Never    Attends Religious Services: More than 4 times per year    Active Member of Clubs or Organizations: No    Attends Banker Meetings: Never    Marital Status: Never married  Intimate Partner Violence: Not At Risk (05/22/2022)   Humiliation, Afraid, Rape, and Kick questionnaire    Fear of Current or Ex-Partner: No    Emotionally Abused: No    Physically Abused: No    Sexually Abused: No    Physical Exam: Today's Vitals   12/02/22 0815 12/02/22 0818  BP: (!) 162/98   Pulse: 77   Temp: (!) 96.2 F (35.7 C) (!) 96.2 F (35.7 C)  TempSrc: Temporal   SpO2: 100%   Weight: (!) 331 lb (150.1 kg)   Height: 6\' 2"  (1.88 m)    Body mass index  is 42.5 kg/m. GEN: NAD EYE: Sclerae anicteric ENT: MMM CV: Non-tachycardic GI: Soft, NT/ND NEURO:  Alert & Oriented x 3  Lab Results: No results for input(s): "WBC", "HGB", "HCT", "PLT" in the last 72 hours. BMET No results for input(s): "NA", "K", "CL", "CO2", "GLUCOSE", "BUN", "CREATININE", "CALCIUM" in the last 72 hours. LFT No results for input(s): "PROT", "ALBUMIN", "AST", "ALT", "ALKPHOS", "BILITOT", "BILIDIR", "IBILI" in the last 72 hours. PT/INR No results for input(s): "LABPROT", "INR" in the  last 72 hours.   Impression / Plan: This is a 52 y.o.female who presents for Colonoscopy for screening.  The risks and benefits of endoscopic evaluation/treatment were discussed with the patient and/or family; these include but are not limited to the risk of perforation, infection, bleeding, missed lesions, lack of diagnosis, severe illness requiring hospitalization, as well as anesthesia and sedation related illnesses.  The patient's history has been reviewed, patient examined, no change in status, and deemed stable for procedure.  The patient and/or family is agreeable to proceed.    Corliss Parish, MD [facility] Advanced Endoscopy Office # [phone]

## 2022-12-02 NOTE — Op Note (Signed)
[facility] Patient Name: Brooke Hughes Procedure Date: 12/02/2022 9:44 AM MRN: [id number] Endoscopist: Corliss Parish , MD, [id number] Age: 52 Referring MD:  Date of Birth: 1971-04-15 Gender: Female Account #: [id number] Procedure:                Colonoscopy Indications:              Screening for colorectal malignant neoplasm, This                            is the patient's first colonoscopy Medicines:                Monitored Anesthesia Care Procedure:                Pre-Anesthesia Assessment:                           - Prior to the procedure, a History and Physical                            was performed, and patient medications and                            allergies were reviewed. The patient's tolerance of                            previous anesthesia was also reviewed. The risks                            and benefits of the procedure and the sedation                            options and risks were discussed with the patient.                            All questions were answered, and informed consent                            was obtained. Prior Anticoagulants: The patient has                            taken no anticoagulant or antiplatelet agents. ASA                            Grade Assessment: III - A patient with severe                            systemic disease. After reviewing the risks and                            benefits, the patient was deemed in satisfactory                            condition to undergo the procedure.  After obtaining informed consent, the colonoscope                            was passed under direct vision. Throughout the                            procedure, the patient's blood pressure, pulse, and                            oxygen saturations were monitored continuously. The                            CF [id number] #[id number] was introduced through the anus                            and advanced  to the 3 cm into the ileum. The                            colonoscopy was performed without difficulty. The                            patient tolerated the procedure. The quality of the                            bowel preparation was adequate. The terminal ileum,                            ileocecal valve, appendiceal orifice, and rectum                            were photographed. Scope In: 9:52:58 AM Scope Out: 10:16:16 AM Scope Withdrawal Time: 0 hours 20 minutes 56 seconds  Total Procedure Duration: 0 hours 23 minutes 18 seconds  Findings:                 The digital rectal exam findings include                            hemorrhoids. Pertinent negatives include no                            palpable rectal lesions.                           The terminal ileum and ileocecal valve appeared                            normal.                           A 25 mm polyp was found in the descending colon.                            The polyp was pedunculated. Preparations were made  for mucosal resection. Demarcation of the lesion                            was performed with high-definition white light and                            narrow band imaging to clearly identify the                            boundaries of the lesion. Saline was injected to                            raise the lesion. Olympus polyloop was plaecd                            carefully over the polyp and snare mucosal                            resection was performed. Resection and retrieval                            were complete. Resected tissue margins were                            examined and clear of polyp tissue. Area was                            tattooed with an injection of Spot (carbon black)                            on contralateral wall.                           Normal mucosa was found in the entire colon                            otherwise.                            Non-bleeding non-thrombosed internal hemorrhoids                            were found during retroflexion, during perianal                            exam and during digital exam. The hemorrhoids were                            Grade II (internal hemorrhoids that prolapse but                            reduce spontaneously). Complications:            No immediate complications. Estimated Blood Loss:     Estimated blood loss was minimal. Impression:               -  Hemorrhoids found on digital rectal exam.                           - The examined portion of the ileum was normal.                           - One 25 mm polyp in the descending colon, removed                            with polyloop placement and subsequent mucosal                            resection. Resected and retrieved.                           - Normal mucosa in the entire examined colon                            otherwise.                           - Non-bleeding non-thrombosed internal hemorrhoids. Recommendation:           - The patient will be observed post-procedure,                            until all discharge criteria are met.                           - Discharge patient to home.                           - Patient has a contact number available for                            emergencies. The signs and symptoms of potential                            delayed complications were discussed with the                            patient. Return to normal activities tomorrow.                            Written discharge instructions were provided to the                            patient.                           - High fiber diet.                           - Use FiberCon 1-2 tablets PO daily.                           -  Continue present medications.                           - Await pathology results.                           - Repeat colonoscopy in 3 years for surveillance.                           - The  findings and recommendations were discussed                            with the patient.                           - The findings and recommendations were discussed                            with the patient's family. Corliss Parish, MD 12/02/2022 10:22:16 AM

## 2022-12-03 ENCOUNTER — Telehealth: Payer: Self-pay | Admitting: *Deleted

## 2022-12-03 NOTE — Telephone Encounter (Signed)
  Follow up Call-     12/02/2022    8:18 AM  Call back number  Post procedure Call Back phone  # [phone]  Permission to leave phone message Yes     Patient questions:  Do you have a fever, pain , or abdominal swelling? No. Pain Score  0 *  Have you tolerated food without any problems? Yes.    Have you been able to return to your normal activities? Yes.    Do you have any questions about your discharge instructions: Diet   No. Medications  No. Follow up visit  No.  Do you have questions or concerns about your Care? No.  Actions: * If pain score is 4 or above: No action needed, pain <4.

## 2022-12-04 ENCOUNTER — Encounter: Payer: Commercial Managed Care - HMO | Admitting: Student

## 2022-12-05 ENCOUNTER — Encounter: Payer: Self-pay | Admitting: Gastroenterology

## 2023-03-22 NOTE — Progress Notes (Unsigned)
CC: Hypertension, diabetes follow-up  HPI:  [name] is a 52 y.o. female living with a history stated below and presents today for hypertension, diabetes follow-up.Marland Kitchen Please see problem based assessment and plan for additional details.  Past Medical History:  Diagnosis Date   Diabetes mellitus without complication (HCC)    Encounter for hepatitis C screening test for low risk patient 05/01/2022   Encounter for screening for HIV 05/01/2022   Hypertension    Obesity    Stroke (HCC) 08/31/2021    Current Outpatient Medications on File Prior to Visit  Medication Sig Dispense Refill   atorvastatin (LIPITOR) 80 MG tablet Take 1 tablet (80 mg total) by mouth daily. 90 tablet 3   Blood Glucose Monitoring Suppl (ONETOUCH VERIO) w/Device KIT 1 each by Does not apply route as needed. 1 kit 0   glucose blood (ONETOUCH VERIO) test strip 1 each by Other route 3 (three) times daily. Use as instructed 100 each 12   HYDROcodone-acetaminophen (NORCO/VICODIN) 5-325 MG tablet Take 1 tablet by mouth every 6 (six) hours as needed.     Insulin Pen Needle (PEN NEEDLES) 32G X 4 MM MISC 1 Units by Does not apply route once a week. 100 each 11   Lancets 30G MISC 1 each by Does not apply route every 8 (eight) hours. 100 each 11   metFORMIN (GLUCOPHAGE) 1000 MG tablet Take 1 tablet (1,000 mg total) by mouth 2 (two) times daily with a meal. 180 tablet 3   Olmesartan-amLODIPine-HCTZ 40-10-25 MG TABS Take 1 tablet by mouth daily at 6 (six) AM. 90 tablet 2   No current facility-administered medications on file prior to visit.     Review of Systems: ROS negative except for what is noted on the assessment and plan.  Vitals:   03/23/23 0847 03/23/23 0925  BP: (!) 151/88 (!) 154/94  Pulse: 63 (!) 57  Temp: 98.2 F (36.8 C)   TempSrc: Oral   SpO2: 99%   Weight: (!) 331 lb 11.2 oz (150.5 kg)   Height: 6\' 2"  (1.88 m)     Physical Exam: Constitutional: Well appearing, not in acute  distress. Cardiovascular: regular rate and rhythm, no m/r/g Pulmonary/Chest: normal work of breathing on room air, lungs clear to auscultation bilaterally Abdominal: soft, non-tender, non-distended Psych: Depressed mood.  Assessment & Plan:   Type 2 diabetes mellitus (HCC) Fasting blood glucose around 250-300. She notes that she is taking 29 units of basaglar nightly and metformin 1000 mg BID.  Intermittently taking the medicine. A1c today 13.1. She is open to adding another medicine SGLT2 to the  current regimen.   Plan: -Increase Basaglar to 32 units nightly -Continue metformin 1000 mg twice daily -Started Farxiga 5 mg. -Follow-up in 1 month.  HTN (hypertension) BP 151/88.  Repeat blood pressure still elevated, 154/94.  Not been compliant with her medicines.  Plan: -Continue losartan 20 mg.  - BMP today.  Tubulovillous adenoma of colon Recent colonoscopy on 11/2022 consistent with tubulovillous abnormal.  She denies rectal bleeding, abdominal pain or significant changes in bowel habits.  -Repeat colonoscopy in 3 years  Left leg swelling She has a 2-day history of left ankle swelling.  Denies any recent trauma.  On my exam, no calf tenderness, no palpable cord, skin changes or warmth.  Evidence of left ankle swelling , no pitting edema. 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  She has a Well score of 1,requiring  further testing with high sensitivity D-dimer. Ddx diagnoses  includes DVT vs venous insufficiency.   -D-dimer -Encouraged leg elevation and compression stockings.  Depressed mood She endorses depressed mood as a result of her living situation with her daughter.  PHQ-9 score today of 19. She is still working on disability benefits, and also getting her own place.   I encourage patient to follow-up on her disability benefits, and let us know if there is anything we can do to help her.  Denies active SI or  HI. -Follow-up at next office visit. -Referral to behavioral health placed.    Patient seen with Dr. Carmon Sails, MD [facility] Internal Medicine, PGY-1 Phone: [phone] Date 03/23/2023 Time 11:07 AM

## 2023-03-23 ENCOUNTER — Ambulatory Visit: Payer: Medicaid Other | Admitting: Student

## 2023-03-23 VITALS — BP 154/94 | HR 57 | Temp 98.2°F | Ht 74.0 in | Wt 331.7 lb

## 2023-03-23 DIAGNOSIS — Z23 Encounter for immunization: Secondary | ICD-10-CM

## 2023-03-23 DIAGNOSIS — E1165 Type 2 diabetes mellitus with hyperglycemia: Secondary | ICD-10-CM

## 2023-03-23 DIAGNOSIS — Z7984 Long term (current) use of oral hypoglycemic drugs: Secondary | ICD-10-CM

## 2023-03-23 DIAGNOSIS — M7989 Other specified soft tissue disorders: Secondary | ICD-10-CM | POA: Diagnosis not present

## 2023-03-23 DIAGNOSIS — Z794 Long term (current) use of insulin: Secondary | ICD-10-CM | POA: Diagnosis not present

## 2023-03-23 DIAGNOSIS — R4589 Other symptoms and signs involving emotional state: Secondary | ICD-10-CM | POA: Diagnosis not present

## 2023-03-23 DIAGNOSIS — I10 Essential (primary) hypertension: Secondary | ICD-10-CM

## 2023-03-23 DIAGNOSIS — D126 Benign neoplasm of colon, unspecified: Secondary | ICD-10-CM

## 2023-03-23 DIAGNOSIS — E119 Type 2 diabetes mellitus without complications: Secondary | ICD-10-CM

## 2023-03-23 LAB — POCT GLYCOSYLATED HEMOGLOBIN (HGB A1C): Hemoglobin A1C: 13.1 % — AB (ref 4.0–5.6)

## 2023-03-23 LAB — GLUCOSE, CAPILLARY: Glucose-Capillary: 279 mg/dL — ABNORMAL HIGH (ref 70–99)

## 2023-03-23 LAB — D-DIMER, QUANTITATIVE: D-Dimer, Quant: 0.72 ug{FEU}/mL — ABNORMAL HIGH (ref 0.00–0.50)

## 2023-03-23 MED ORDER — BASAGLAR KWIKPEN 100 UNIT/ML ~~LOC~~ SOPN: 32.0000 [IU] | PEN_INJECTOR | Freq: Every day | SUBCUTANEOUS | 5 refills | Status: DC

## 2023-03-23 MED ORDER — DAPAGLIFLOZIN PROPANEDIOL 5 MG PO TABS: 5.0000 mg | ORAL_TABLET | Freq: Every day | ORAL | 3 refills | Status: DC

## 2023-03-23 NOTE — Assessment & Plan Note (Signed)
Recent colonoscopy on 11/2022 consistent with tubulovillous abnormal.  She denies rectal bleeding, abdominal pain or significant changes in bowel habits.  -Repeat colonoscopy in 3 years

## 2023-03-23 NOTE — Assessment & Plan Note (Signed)
She has a 2-day history of left ankle swelling.  Denies any recent trauma.  On my exam, no calf tenderness, no palpable cord, skin changes or warmth.  Evidence of left ankle swelling , no pitting edema. 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  She has a Well score of 1,requiring further testing with high sensitivity D-dimer. Ddx diagnoses  includes DVT vs venous insufficiency.   -D-dimer -Encouraged leg elevation and compression stockings.

## 2023-03-23 NOTE — Addendum Note (Signed)
Addended by: Laretta Bolster on: 03/23/2023 11:23 AM   Modules accepted: Orders

## 2023-03-23 NOTE — Patient Instructions (Signed)
Thank you, Ms.Valaria Good for allowing [place] to provide your care today.    1.  For diabetes, increase Basaglar to 32 units.  Continue metformin 1000 mg twice daily.  2.  Added any medicine, Farxiga 5 mg to help with your diabetes.  3.  For hypertension, the blood pressure still elevated.  Continue lisinopril 20 mg.  4.  For the left ankle swelling, recommending leg elevation especially at the end of the night, and compression stockings.  I have ordered the following labs for you:  Lab Orders         Glucose, capillary         D-dimer, quantitative (not at [facility])         Basic metabolic panel         POC Hbg A1C       I have ordered the following medication/changed the following medications:   Stop the following medications: Medications Discontinued During This Encounter  Medication Reason   Insulin Glargine (BASAGLAR KWIKPEN) 100 UNIT/ML      Start the following medications: Meds ordered this encounter  Medications   dapagliflozin propanediol (FARXIGA) 5 MG TABS tablet    Sig: Take 1 tablet (5 mg total) by mouth daily before breakfast.    Dispense:  30 tablet    Refill:  3   Insulin Glargine (BASAGLAR KWIKPEN) 100 UNIT/ML    Sig: Inject 32 Units into the skin daily.    Dispense:  9.6 mL    Refill:  5     Follow up: 1 month   Should you have any questions or concerns please call the internal medicine clinic at [phone].    Laretta Bolster, MD  [facility]

## 2023-03-23 NOTE — Assessment & Plan Note (Signed)
Fasting blood glucose around 250-300. She notes that she is taking 29 units of basaglar nightly and metformin 1000 mg BID.  Intermittently taking the medicine. A1c today 13.1. She is open to adding another medicine SGLT2 to the  current regimen.   Plan: -Increase Basaglar to 32 units nightly -Continue metformin 1000 mg twice daily -Started Farxiga 5 mg. -Follow-up in 1 month.

## 2023-03-23 NOTE — Assessment & Plan Note (Signed)
BP 151/88.  Repeat blood pressure still elevated, 154/94.  Not been compliant with her medicines.  Plan: -Continue losartan 20 mg.  - BMP today.

## 2023-03-23 NOTE — Assessment & Plan Note (Signed)
She endorses depressed mood as a result of her living situation with her daughter.  PHQ-9 score today of 19. She is still working on disability benefits, and also getting her own place.   I encourage patient to follow-up on her disability benefits, and let us know if there is anything we can do to help her.  Denies active SI or HI. -Follow-up at next office visit. -Referral to behavioral health placed.

## 2023-03-24 LAB — BASIC METABOLIC PANEL
BUN/Creatinine Ratio: 13 (ref 9–23)
BUN: 10 mg/dL (ref 6–24)
CO2: 23 mmol/L (ref 20–29)
Calcium: 9.9 mg/dL (ref 8.7–10.2)
Chloride: 100 mmol/L (ref 96–106)
Creatinine, Ser: 0.78 mg/dL (ref 0.57–1.00)
Glucose: 277 mg/dL — ABNORMAL HIGH (ref 70–99)
Potassium: 4.4 mmol/L (ref 3.5–5.2)
Sodium: 139 mmol/L (ref 134–144)
eGFR: 92 mL/min/{1.73_m2} (ref >59)

## 2023-03-26 ENCOUNTER — Telehealth: Payer: Self-pay

## 2023-03-26 NOTE — Telephone Encounter (Signed)
Prior Authorization for patient Brooke Hughes 5MG ) came through on cover my meds was submitted with last office notes and labs awaiting approval or denial.  XBJ:[id number]

## 2023-03-26 NOTE — Telephone Encounter (Signed)
Decision:Approved Kyndle Schlender (Key: [id number]) PA Case ID #: [id number] Need Help? Call us at [phone] Outcome Approved today by [facility] 2017 NCPDP Request Reference Number: [id number]. FARXIGA TAB 5MG  is approved through 03/25/2024. For further questions, call [employer] at [phone]. Authorization Expiration Date: 03/25/2024 Drug Farxiga 5MG  tablets ePA cloud logo Form [employer] Electronic Prior Authorization Form [phone] NCPDP)

## 2023-03-26 NOTE — Progress Notes (Signed)
Internal Medicine Clinic Attending  I was physically present during the key portions of the resident provided service and participated in the medical decision making of patient's management care. I reviewed pertinent patient test results.  The assessment, diagnosis, and plan were formulated together and I agree with the documentation in the resident's note.  D dimer elevated, will get ultrasound.  Dr. Candise Bowens will call patient to get organized.   Inez Catalina, MD

## 2023-04-20 ENCOUNTER — Ambulatory Visit (HOSPITAL_COMMUNITY)
Admission: RE | Admit: 2023-04-20 | Discharge: 2023-04-20 | Disposition: A | Discharge status: Home / Self Care | Payer: Medicaid Other | Source: Ambulatory Visit | Attending: Internal Medicine | Admitting: Internal Medicine

## 2023-04-20 DIAGNOSIS — M7989 Other specified soft tissue disorders: Secondary | ICD-10-CM | POA: Diagnosis not present

## 2023-04-20 NOTE — Progress Notes (Signed)
Left lower extremity venous duplex has been completed. Preliminary results can be found in CV Proc through chart review.  Results were faxed to Dr. Carron Brazen.  04/20/23 3:32 PM Olen Cordial RVT

## 2023-05-29 ENCOUNTER — Ambulatory Visit (HOSPITAL_COMMUNITY): Payer: Medicaid Other | Admitting: Physician Assistant

## 2023-05-29 ENCOUNTER — Ambulatory Visit (INDEPENDENT_AMBULATORY_CARE_PROVIDER_SITE_OTHER): Payer: Medicaid Other | Admitting: Physician Assistant

## 2023-05-29 VITALS — BP 167/87 | HR 67 | Temp 97.7°F | Ht 74.0 in | Wt 330.8 lb

## 2023-05-29 DIAGNOSIS — F411 Generalized anxiety disorder: Secondary | ICD-10-CM

## 2023-05-29 DIAGNOSIS — F331 Major depressive disorder, recurrent, moderate: Secondary | ICD-10-CM

## 2023-05-29 NOTE — Progress Notes (Unsigned)
Psychiatric Initial Adult Assessment   Patient Identification: Brooke Hughes MRN:  [id number] Date of Evaluation:  05/31/2023 Referral Source: Appointment following recent hospital discharge Chief Complaint:   Chief Complaint  Patient presents with   Establish Care   Visit Diagnosis:    ICD-10-CM   1. Moderate episode of recurrent major depressive disorder (HCC)  F33.1     2. Generalized anxiety disorder  F41.1       History of Present Illness:  ***  Brooke Hughes  Associated Signs/Symptoms: Depression Symptoms:  depressed mood, insomnia, hypersomnia, psychomotor agitation, psychomotor retardation, fatigue, difficulty concentrating, impaired memory, loss of energy/fatigue, disturbed sleep, weight gain, (Hypo) Manic Symptoms:  Distractibility, Elevated Mood, Grandiosity, Anxiety Symptoms:   Patient denies Psychotic Symptoms:   Patient denies PTSD Symptoms: Negative  Past Psychiatric History:  Patient denies past history of psychiatric diagnoses  Patient denies a past history of hospitalization due to mental health  Patient denies a past history of suicide attempt  Patient denies a past history of homicide attempt  Previous Psychotropic Medications: No   Substance Abuse History in the last 12 months:  No.  Consequences of Substance Abuse: Negative  Past Medical History:  Past Medical History:  Diagnosis Date   Diabetes mellitus without complication (HCC)    Encounter for hepatitis C screening test for low risk patient 05/01/2022   Encounter for screening for HIV 05/01/2022   Hypertension    Obesity    Stroke (HCC) 08/31/2021    Past Surgical History:  Procedure Laterality Date   FEMUR FRACTURE SURGERY     r/hip   JOINT REPLACEMENT     PERIPHERAL VASCULAR INTERVENTION Left 08/01/2020   Procedure: PERIPHERAL VASCULAR INTERVENTION;  Surgeon: Cephus Shelling, MD;  [facility];  Service: Cardiovascular;  Laterality:  Left;  common iliac vein   PERIPHERAL VASCULAR THROMBECTOMY N/A 08/01/2020   Procedure: PERIPHERAL VASCULAR THROMBECTOMY - Left Leg;  Surgeon: Cephus Shelling, MD;  [facility]: [facility];  Service: Cardiovascular;  Laterality: N/A;    Family Psychiatric History:  Nephew - patient reports that her nephew suffers from mental illness due to being involved in a car accident  Family history of suicide attempt: Patient denies Family history of homicide attempt: Patient denies Family history of substance abuse: Patient denies  Family History:  Family History  Problem Relation Age of Onset   Diabetes Mother    Diabetes Father    Colon cancer Neg Hx    Colon polyps Neg Hx    Esophageal cancer Neg Hx    Rectal cancer Neg Hx    Stomach cancer Neg Hx     Social History:   Social History   Socioeconomic History   Marital status: Married    Spouse name: Not on file   Number of children: Not on file   Years of education: Not on file   Highest education level: Not on file  Occupational History   Not on file  Tobacco Use   Smoking status: Never   Smokeless tobacco: Never  Vaping Use   Vaping status: Never Used  Substance and Sexual Activity   Alcohol use: No   Drug use: No   Sexual activity: Not on file  Other Topics Concern   Not on file  Social History Narrative   Not on file   Social Drivers of Health   Financial Resource Strain: Low Risk  (09/03/2021)   Received from [facility], [facility]  Overall Financial Resource Strain (CARDIA)    Difficulty of Paying Living Expenses: Not hard at all  Food Insecurity: No Food Insecurity (05/22/2022)   Hunger Vital Sign    Worried About Running Out of Food in the Last Year: Never true    Ran Out of Food in the Last Year: Never true  Transportation Needs: No Transportation Needs (09/04/2022)   PRAPARE - Administrator, Civil Service (Medical): No    Lack of Transportation (Non-Medical): No  Physical  Activity: Not on file  Stress: Patient Declined (09/02/2021)   Received from [facility], [facility] of Occupational Health - Occupational Stress Questionnaire    Feeling of Stress : Patient declined  Social Connections: Socially Isolated (05/22/2022)   Social Connection and Isolation Panel [NHANES]    Frequency of Communication with Friends and Family: Never    Frequency of Social Gatherings with Friends and Family: Never    Attends Religious Services: More Hughes 4 times per year    Active Member of [employer] or Organizations: No    Attends Banker Meetings: Never    Marital Status: Never married    Additional Social History:  Patient endorses social support. Patient endorses having children of her own. Patient denies housing and currently homeless and living in her car. Patient is unemployed. Patient denies a past history of military experience.  Patient endorses a past history of presenting in jail time.  She reports that she spent 13 months in prison back in 2017.  Patient reports that the highest education she is around is 12th grade.  Patient reports that she is has completed one semester of college.  Patient denies access to weapons.  Allergies:   Allergies  Allergen Reactions   Codeine     Other Reaction(s): Other (See Comments), Unknown   Vicodin [Hydrocodone-Acetaminophen] Hives   Penicillins Hives and Rash    Metabolic Disorder Labs: Lab Results  Component Value Date   HGBA1C 13.1 (A) 03/23/2023   MPG 228.82 07/30/2020   No results found for: "PROLACTIN" No results found for: "CHOL", "TRIG", "HDL", "CHOLHDL", "VLDL", "LDLCALC" Lab Results  Component Value Date   TSH 1.649 07/30/2020    Therapeutic Level Labs: No results found for: "LITHIUM" No results found for: "CBMZ" No results found for: "VALPROATE"  Current Medications: Current Outpatient Medications  Medication Sig Dispense Refill   atorvastatin (LIPITOR) 80 MG tablet Take  1 tablet (80 mg total) by mouth daily. 90 tablet 3   Blood Glucose Monitoring Suppl (ONETOUCH VERIO) w/Device KIT 1 each by Does not apply route as needed. 1 kit 0   dapagliflozin propanediol (FARXIGA) 5 MG TABS tablet Take 1 tablet (5 mg total) by mouth daily before breakfast. 30 tablet 3   glucose blood (ONETOUCH VERIO) test strip 1 each by Other route 3 (three) times daily. Use as instructed 100 each 12   HYDROcodone-acetaminophen (NORCO/VICODIN) 5-325 MG tablet Take 1 tablet by mouth every 6 (six) hours as needed.     Insulin Glargine (BASAGLAR KWIKPEN) 100 UNIT/ML Inject 32 Units into the skin daily. 9.6 mL 5   Insulin Pen Needle (PEN NEEDLES) 32G X 4 MM MISC 1 Units by Does not apply route once a week. 100 each 11   Lancets 30G MISC 1 each by Does not apply route every 8 (eight) hours. 100 each 11   metFORMIN (GLUCOPHAGE) 1000 MG tablet Take 1 tablet (1,000 mg total) by mouth 2 (two) times daily with  a meal. 180 tablet 3   Olmesartan-amLODIPine-HCTZ 40-10-25 MG TABS Take 1 tablet by mouth daily at 6 (six) AM. 90 tablet 2   No current facility-administered medications for this visit.    Musculoskeletal: Strength & Muscle Tone: within normal limits Gait & Station: normal Patient leans: N/A  Psychiatric Specialty Exam: Review of Systems  Psychiatric/Behavioral:  Positive for dysphoric mood. Negative for decreased concentration, hallucinations, self-injury, sleep disturbance and suicidal ideas. The patient is nervous/anxious. The patient is not hyperactive.     Blood pressure (!) 167/87, pulse 67, temperature 97.7 F (36.5 C), temperature source Oral, height 6\' 2"  (1.88 m), weight (!) 330 lb 12.8 oz (150 kg).Body mass index is 42.47 kg/m.  General Appearance: Casual  Eye Contact:  Good  Speech:  Clear and Coherent and Normal Rate  Volume:  Normal  Mood:  Anxious and Depressed  Affect:  Congruent  Thought Process:  Coherent and Descriptions of Associations: Intact  Orientation:   Full (Time, Place, and Person)  Thought Content:  WDL  Suicidal Thoughts:  No  Homicidal Thoughts:  No  Memory:  Immediate;   Good Recent;   Good Remote;   Good  Judgement:  Good  Insight:  Good  Psychomotor Activity:  Normal  Concentration:  Concentration: Good and Attention Span: Good  Recall:  Good  Fund of Knowledge:Good  Language: Good  Akathisia:  No  Handed:  Left  AIMS (if indicated):  not done  Assets:  Communication Skills Desire for Improvement Social Support Transportation  ADL's:  Intact  Cognition: WNL  Sleep:  Good   Screenings: GAD-7    Flowsheet Row Office Visit from 05/29/2023 in [facility]  Total GAD-7 Score 12      PHQ2-9    Flowsheet Row Office Visit from 05/29/2023 in [facility] Office Visit from 03/23/2023 in [facility] Internal Med Ctr - A Dept Of [place]. [facility] Office Visit from 11/03/2022 in [facility] Internal Med Ctr - A Dept Of [place]. [facility] Office Visit from 05/22/2022 in [facility] Internal Med Ctr - A Dept Of [place]. [facility] Office Visit from 08/07/2020 in [facility] Internal Med Ctr - A Dept Of [place]. [facility]  PHQ-2 Total Score 4 4 0 0 0  PHQ-9 Total Score 21 18 -- 0 0      Flowsheet Row Office Visit from 05/29/2023 in [facility] ED from 08/31/2022 in [facility] at [facility] ED from 08/26/2021 in [facility] Emergency Department at [facility]  C-SSRS RISK CATEGORY No Risk No Risk No Risk       Assessment and Plan: ***    Collaboration of Care: Psychiatrist AEB patient being followed by a mental health provider at this facility and Referral or follow-up with counselor/therapist AEB patient being seen by a licensed clinical social worker at this facility  Patient/Guardian was advised Release of Information must be obtained prior to any record release in  order to collaborate their care with an outside provider. Patient/Guardian was advised if they have not already done so to contact the registration department to sign all necessary forms in order for [place] to release information regarding their care.   Consent: Patient/Guardian gives verbal consent for treatment and assignment of benefits for services provided during this visit. Patient/Guardian expressed understanding and agreed to proceed.   1. Moderate episode of recurrent major depressive disorder (  HCC) (Primary) - Patient refuses medication at this time - Patient would like to start therapy prior to medication management  2. Generalized anxiety disorder - Patient refuses medication at this time - Patient would like to start therapy prior to medication management  Patient to follow up in 6 weeks Provider spent a total of 42 minutes with the patient/reviewing patient's chart  Meta Hatchet, PA 12/15/202410:57 PM

## 2023-05-31 ENCOUNTER — Encounter (HOSPITAL_COMMUNITY): Payer: Self-pay | Admitting: Physician Assistant

## 2023-05-31 DIAGNOSIS — F331 Major depressive disorder, recurrent, moderate: Secondary | ICD-10-CM | POA: Insufficient documentation

## 2023-05-31 DIAGNOSIS — F411 Generalized anxiety disorder: Secondary | ICD-10-CM | POA: Insufficient documentation

## 2023-07-14 ENCOUNTER — Ambulatory Visit (HOSPITAL_COMMUNITY): Payer: Medicaid Other | Admitting: Physician Assistant

## 2023-07-14 VITALS — BP 184/98 | HR 70 | Temp 97.7°F | Ht 74.0 in | Wt 327.6 lb

## 2023-07-14 DIAGNOSIS — F411 Generalized anxiety disorder: Secondary | ICD-10-CM

## 2023-07-14 DIAGNOSIS — F331 Major depressive disorder, recurrent, moderate: Secondary | ICD-10-CM

## 2023-07-14 NOTE — Progress Notes (Signed)
BH MD/PA/NP OP Progress Note  07/14/2023 11:37 AM Brooke Hughes  MRN:  [id number]  Chief Complaint:  Chief Complaint  Patient presents with   Follow-up   HPI:   Brooke Hughes is a 53 year old female with a past psychiatric history significant for major depressive disorder and generalized anxiety disorder who presents to [facility] for follow-up.  Patient is not currently on any psychiatric medications at this time.  Patient presents to the encounter stating that she is still experiencing depression.  Patient rates her depression as 5 out of 10 with 10 being most severe.  Patient endorses depressive episodes 3 days out of the week.  Patient endorses the following depressive symptoms: feelings of sadness, lack of motivation, decreased concentration, decreased energy, excessive worrying, feelings of guilt/worthlessness, and hopelessness.  Patient reports that her depressive symptoms are alleviated by reading.  In addition to depression, patient endorses anxiety and rates her anxiety as 6 out of 10.  Patient denies any new stressors at this time.  A PHQ-9 screen was performed with the patient scoring a 16.  A GAD-7 screen was also performed with the patient scoring a 2.  Patient is alert and oriented x 4, calm, cooperative, and fully engaged in conversation during the encounter.  Patient describes her mood as tired.  Patient exhibits depressed mood with congruent affect.  Patient denies suicidal or homicidal ideations.  She further denies auditory or visual hallucinations and does not appear to be responding to internal/external stimuli.  Patient endorses fair sleep and receives on average 5 hours of sleep per night.  Patient endorses good appetite and eats on average 3 meals per day.  Patient denies alcohol consumption, tobacco use, or illicit drug use.  Visit Diagnosis:    ICD-10-CM   1. Moderate episode of recurrent major depressive disorder (HCC)   F33.1     2. Generalized anxiety disorder  F41.1       Past Psychiatric History:  Patient denies past history of psychiatric diagnoses   Patient denies a past history of hospitalization due to mental health   Patient denies a past history of suicide attempt   Patient denies a past history of homicide attempt  Past Medical History:  Past Medical History:  Diagnosis Date   Diabetes mellitus without complication (HCC)    Encounter for hepatitis C screening test for low risk patient 05/01/2022   Encounter for screening for HIV 05/01/2022   Hypertension    Obesity    Stroke (HCC) 08/31/2021    Past Surgical History:  Procedure Laterality Date   FEMUR FRACTURE SURGERY     r/hip   JOINT REPLACEMENT     PERIPHERAL VASCULAR INTERVENTION Left 08/01/2020   Procedure: PERIPHERAL VASCULAR INTERVENTION;  Surgeon: Cephus Shelling, MD;  [facility];  Service: Cardiovascular;  Laterality: Left;  common iliac vein   PERIPHERAL VASCULAR THROMBECTOMY N/A 08/01/2020   Procedure: PERIPHERAL VASCULAR THROMBECTOMY - Left Leg;  Surgeon: Cephus Shelling, MD;  [facility]: [facility];  Service: Cardiovascular;  Laterality: N/A;    Family Psychiatric History:  Nephew - patient reports that her nephew suffers from mental illness due to being involved in a car accident   Family history of suicide attempt: Patient denies Family history of homicide attempt: Patient denies Family history of substance abuse: Patient denies  Family History:  Family History  Problem Relation Age of Onset   Diabetes Mother    Diabetes Father  Colon cancer Neg Hx    Colon polyps Neg Hx    Esophageal cancer Neg Hx    Rectal cancer Neg Hx    Stomach cancer Neg Hx     Social History:  Social History   Socioeconomic History   Marital status: Married    Spouse name: Not on file   Number of children: Not on file   Years of education: Not on file   Highest education level: Not on  file  Occupational History   Not on file  Tobacco Use   Smoking status: Never   Smokeless tobacco: Never  Vaping Use   Vaping status: Never Used  Substance and Sexual Activity   Alcohol use: No   Drug use: No   Sexual activity: Not on file  Other Topics Concern   Not on file  Social History Narrative   Not on file   Social Drivers of Health   Financial Resource Strain: Low Risk  (09/03/2021)   Received from [facility], [facility]   Overall Financial Resource Strain (CARDIA)    Difficulty of Paying Living Expenses: Not hard at all  Food Insecurity: No Food Insecurity (05/22/2022)   Hunger Vital Sign    Worried About Running Out of Food in the Last Year: Never true    Ran Out of Food in the Last Year: Never true  Transportation Needs: No Transportation Needs (09/04/2022)   PRAPARE - Administrator, Civil Service (Medical): No    Lack of Transportation (Non-Medical): No  Physical Activity: Inactive (07/15/2023)   Exercise Vital Sign    Days of Exercise per Week: 0 days    Minutes of Exercise per Session: 0 min  Stress: No Stress Concern Present (07/15/2023)   [employer] of Occupational Health - Occupational Stress Questionnaire    Feeling of Stress : Only a little  Social Connections: Socially Isolated (05/22/2022)   Social Connection and Isolation Panel [NHANES]    Frequency of Communication with Friends and Family: Never    Frequency of Social Gatherings with Friends and Family: Never    Attends Religious Services: More than 4 times per year    Active Member of [employer] or Organizations: No    Attends Banker Meetings: Never    Marital Status: Never married    Allergies:  Allergies  Allergen Reactions   Codeine     Other Reaction(s): Other (See Comments), Unknown   Vicodin [Hydrocodone-Acetaminophen] Hives   Penicillins Hives and Rash    Metabolic Disorder Labs: Lab Results  Component Value Date   HGBA1C 13.1 (A) 03/23/2023   MPG  228.82 07/30/2020   No results found for: "PROLACTIN" No results found for: "CHOL", "TRIG", "HDL", "CHOLHDL", "VLDL", "LDLCALC" Lab Results  Component Value Date   TSH 1.649 07/30/2020    Therapeutic Level Labs: No results found for: "LITHIUM" No results found for: "VALPROATE" No results found for: "CBMZ"  Current Medications: Current Outpatient Medications  Medication Sig Dispense Refill   atorvastatin (LIPITOR) 80 MG tablet Take 1 tablet (80 mg total) by mouth daily. 90 tablet 3   Blood Glucose Monitoring Suppl (ONETOUCH VERIO) w/Device KIT 1 each by Does not apply route as needed. 1 kit 0   dapagliflozin propanediol (FARXIGA) 5 MG TABS tablet Take 1 tablet (5 mg total) by mouth daily before breakfast. 30 tablet 3   glucose blood (ONETOUCH VERIO) test strip 1 each by Other route 3 (three) times daily. Use as instructed 100 each 12  HYDROcodone-acetaminophen (NORCO/VICODIN) 5-325 MG tablet Take 1 tablet by mouth every 6 (six) hours as needed.     Insulin Glargine (BASAGLAR KWIKPEN) 100 UNIT/ML Inject 32 Units into the skin daily. 9.6 mL 5   Insulin Pen Needle (PEN NEEDLES) 32G X 4 MM MISC 1 Units by Does not apply route once a week. 100 each 11   Lancets 30G MISC 1 each by Does not apply route every 8 (eight) hours. 100 each 11   metFORMIN (GLUCOPHAGE) 1000 MG tablet Take 1 tablet (1,000 mg total) by mouth 2 (two) times daily with a meal. 180 tablet 3   Olmesartan-amLODIPine-HCTZ 40-10-25 MG TABS Take 1 tablet by mouth daily at 6 (six) AM. 90 tablet 2   No current facility-administered medications for this visit.     Musculoskeletal: Strength & Muscle Tone: within normal limits Gait & Station: normal Patient leans: N/A  Psychiatric Specialty Exam: Review of Systems  Psychiatric/Behavioral:  Positive for dysphoric mood and sleep disturbance. Negative for decreased concentration, hallucinations, self-injury and suicidal ideas. The patient is nervous/anxious. The patient is not  hyperactive.     Blood pressure (!) 184/98, pulse 70, temperature 97.7 F (36.5 C), temperature source Oral, height 6\' 2"  (1.88 m), weight (!) 327 lb 9.6 oz (148.6 kg), SpO2 100%.Body mass index is 42.06 kg/m.  General Appearance: Casual  Eye Contact:  Good  Speech:  Clear and Coherent and Normal Rate  Volume:  Normal  Mood:  Anxious and Depressed  Affect:  Congruent  Thought Process:  Coherent and Descriptions of Associations: Intact  Orientation:  Full (Time, Place, and Person)  Thought Content: WDL   Suicidal Thoughts:  No  Homicidal Thoughts:  No  Memory:  Immediate;   Good Recent;   Good Remote;   Good  Judgement:  Good  Insight:  Good  Psychomotor Activity:  Normal  Concentration:  Concentration: Good and Attention Span: Good  Recall:  Good  Fund of Knowledge: Good  Language: Good  Akathisia:  No  Handed:  Left  AIMS (if indicated): not done  Assets:  Communication Skills Desire for Improvement Social Support Transportation  ADL's:  Intact  Cognition: WNL  Sleep:  Fair   Screenings: GAD-7    Flowsheet Row Clinical Support from 07/14/2023 in [facility] Office Visit from 05/29/2023 in [facility]  Total GAD-7 Score 2 12      PHQ2-9    Flowsheet Row Clinical Support from 07/14/2023 in [facility] Office Visit from 05/29/2023 in [facility] Office Visit from 03/23/2023 in [facility] Internal Med Ctr - A Dept Of [place]. [facility] Office Visit from 11/03/2022 in [facility] Internal Med Ctr - A Dept Of [place]. [facility] Office Visit from 05/22/2022 in [facility] Internal Med Ctr - A Dept Of [place]. [facility]  PHQ-2 Total Score 6 4 4  0 0  PHQ-9 Total Score 16 21 18  -- 0      Flowsheet Row Clinical Support from 07/14/2023 in [facility] Office Visit from 05/29/2023 in [facility] ED from 08/31/2022 in [facility] at [facility] RISK CATEGORY No Risk No Risk No Risk        Assessment and Plan:   Brooke Hughes is a 53 year old female with a past psychiatric history significant for major depressive disorder and generalized anxiety disorder who presents  to [facility] for follow-up.  Patient is not currently on any psychiatric medications at this time.  Patient presents to the encounter stating that she continues to experience depressive symptoms and anxiety.  Patient exhibited depressed mood with congruent affect.  Despite exhibiting depressive symptoms and anxiety, patient refuses to be placed on psychiatric medications at this time and instead would like to continue therapy.  Patient reports that she may consider medication management during her next encounter.  Patient to follow up with provider during her next appointment.  Collaboration of Care: Collaboration of Care: Psychiatrist AEB patient being followed by mental health provider at this facility and Referral or follow-up with counselor/therapist AEB patient being seen by licensed clinical social worker at this facility  Patient/Guardian was advised Release of Information must be obtained prior to any record release in order to collaborate their care with an outside provider. Patient/Guardian was advised if they have not already done so to contact the registration department to sign all necessary forms in order for [place] to release information regarding their care.   Consent: Patient/Guardian gives verbal consent for treatment and assignment of benefits for services provided during this visit. Patient/Guardian expressed understanding and agreed to proceed.   1. Moderate episode of recurrent major depressive disorder (HCC) (Primary) Patient is refusing medication management at this time Patient would like to continue with therapy  2.  Generalized anxiety disorder Patient is refusing medication management at this time Patient would like to continue with therapy  Patient to follow up in 6 weeks Provider spent a total of 17 minutes with the patient/reviewing patient's chart  Meta Hatchet, PA 07/14/2023, 11:37 AM

## 2023-07-15 ENCOUNTER — Encounter (HOSPITAL_COMMUNITY): Payer: Self-pay | Admitting: Physician Assistant

## 2023-07-15 ENCOUNTER — Ambulatory Visit (INDEPENDENT_AMBULATORY_CARE_PROVIDER_SITE_OTHER): Payer: Medicaid Other | Admitting: Mental Health

## 2023-07-15 DIAGNOSIS — F411 Generalized anxiety disorder: Secondary | ICD-10-CM

## 2023-07-15 DIAGNOSIS — F331 Major depressive disorder, recurrent, moderate: Secondary | ICD-10-CM | POA: Diagnosis not present
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
## 2023-08-27 ENCOUNTER — Encounter (HOSPITAL_COMMUNITY): Payer: Medicaid Other | Admitting: Physician Assistant

## 2023-09-02 ENCOUNTER — Ambulatory Visit: Payer: Self-pay | Admitting: Student

## 2023-09-02 NOTE — Telephone Encounter (Signed)
 Spoke to pt - stated she might have an UTI. Also she's unsure which meds she needs refills; stated Lisinopril but not on med list. LOV - 03/23/23. Appt schedule 3/24 with Dr Rosaura Carpenter.

## 2023-09-02 NOTE — Telephone Encounter (Signed)
 This RN made first attempt to contact pt with no answer. A vm was left. Call back number provided.  Copied from CRM [id number]. Topic: Clinical - Medication Question >> Sep 02, 2023  2:24 PM Irine Seal wrote: Reason for CRM: Patient presents with concerns regarding blood pressure management and is unaware of their current blood pressure levels. They also wish to monitor both their blood pressure and glucose levels. The patient is requesting a complete physical exam (CPE). The first available appointment is on 09/21/23; offered to schedule appointment and send message however, the patient declined the appointment and requested a nurse callback to discuss how to obtain their medication refill before they can be seen.  Patient callback [phone] (H)

## 2023-09-02 NOTE — Telephone Encounter (Signed)
 This RN made second attempt to contact pt with no answer. A vm was left. Call back number provided.

## 2023-09-03 ENCOUNTER — Ambulatory Visit (HOSPITAL_COMMUNITY): Payer: Medicaid Other | Admitting: Mental Health

## 2023-09-03 ENCOUNTER — Encounter (HOSPITAL_COMMUNITY): Payer: Self-pay

## 2023-09-07 ENCOUNTER — Encounter: Admitting: Student

## 2023-09-11 ENCOUNTER — Encounter (HOSPITAL_COMMUNITY): Admitting: Physician Assistant

## 2023-09-11 ENCOUNTER — Encounter (HOSPITAL_COMMUNITY): Payer: Self-pay

## 2023-10-28 ENCOUNTER — Other Ambulatory Visit: Payer: Self-pay

## 2023-10-28 ENCOUNTER — Encounter (HOSPITAL_COMMUNITY): Payer: Self-pay | Admitting: Emergency Medicine

## 2023-10-28 ENCOUNTER — Ambulatory Visit (HOSPITAL_COMMUNITY)
Admission: EM | Admit: 2023-10-28 | Discharge: 2023-10-28 | Disposition: A | Discharge status: Home / Self Care | Attending: Emergency Medicine | Admitting: Emergency Medicine

## 2023-10-28 DIAGNOSIS — L0231 Cutaneous abscess of buttock: Secondary | ICD-10-CM | POA: Diagnosis not present

## 2023-10-28 DIAGNOSIS — E1165 Type 2 diabetes mellitus with hyperglycemia: Secondary | ICD-10-CM

## 2023-10-28 DIAGNOSIS — Z760 Encounter for issue of repeat prescription: Secondary | ICD-10-CM

## 2023-10-28 MED ORDER — BASAGLAR KWIKPEN 100 UNIT/ML ~~LOC~~ SOPN: 32.0000 [IU] | PEN_INJECTOR | Freq: Every day | SUBCUTANEOUS | 0 refills | Status: DC

## 2023-10-28 MED ORDER — SULFAMETHOXAZOLE-TRIMETHOPRIM 800-160 MG PO TABS: 1.0000 | ORAL_TABLET | Freq: Two times a day (BID) | ORAL | 0 refills | Status: AC

## 2023-10-28 NOTE — Discharge Instructions (Signed)
 Start taking Bactrim  twice daily for 7 days for abscess coverage. Apply warm compresses and do warm water soaks to help promote drainage from th area. If you notice worsening pain, swelling, or lots of pus-like-drainage from the area return here for re-evaluation.  I have refilled your Insulin  Glargine for 30 days. Please schedule a follow-up appointment with your primary care doctor for more refills of your insulin  and further management of your diabetes. Return here as needed.

## 2023-10-28 NOTE — ED Triage Notes (Signed)
 Patient is out of insulin -requesting refill.  Cbg today 195.

## 2023-10-28 NOTE — ED Triage Notes (Signed)
 Patient reports a bug bite to right buttocks about 2 days ago.  Bump has increased in size and has increased in pain.

## 2023-10-28 NOTE — ED Provider Notes (Signed)
 [facility]    CSN: [id number] Arrival date & time: 10/28/23  1450      History   Chief Complaint Chief Complaint  Patient presents with   Abscess    HPI Brooke Hughes is a 53 y.o. female.   Patient presents with a painful bump to her right buttock that she noticed 2 days ago. Patient states that the bump as gradually increased in size over the last 2 days. Patient states she thinks she may have initially been bitten by a bug, but is unsure. Patient states that she has noticed some drainage from the area as well.   Patient is also requesting a refill of her insulin  glargine. Patient has history of Type II DM. Patient states that she recently ran out and does not currently have an appointment scheduled with her primary care doctor because they are currently switching locations and she has had difficulty getting an appointment. Patient states that she takes 32 units of insulin  glargine daily.   The history is provided by the patient and medical records.  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   Left leg swelling 03/23/2023   Depressed mood 03/23/2023   Left shoulder pain 11/03/2022   Screening for colon cancer 11/03/2022   Vaginal pruritus 09/04/2022   Housing situation unstable 09/04/2022   Screening for cervical cancer 05/01/2022   History of cerebrovascular accident (CVA) in adulthood 05/01/2022   Vitamin D deficiency 09/09/2021   Cerebrovascular accident (CVA) due to thrombosis of right middle cerebral artery  (HCC) 09/07/2021   Pure hypercholesterolemia 09/07/2021   Noncompliance with diabetes treatment 09/05/2021   Morbid obesity (HCC) 09/02/2021   Uncontrolled type 2 diabetes mellitus with hyperglycemia (HCC) 09/02/2021   HTN (hypertension) 08/07/2020   Healthcare maintenance 08/07/2020   Type 2 diabetes mellitus (HCC) 08/02/2020   IDA (iron  deficiency anemia) 08/02/2020   Thyroid nodule 08/02/2020   DVT (deep venous thrombosis) (HCC) 07/29/2020    Past Surgical History:  Procedure Laterality Date   FEMUR FRACTURE SURGERY     r/hip   JOINT REPLACEMENT     PERIPHERAL VASCULAR INTERVENTION Left 08/01/2020   Procedure: PERIPHERAL VASCULAR INTERVENTION;  Surgeon: Young Hensen, MD;  Location: [facility];  Service: Cardiovascular;  Laterality: Left;  common iliac vein   PERIPHERAL VASCULAR THROMBECTOMY N/A 08/01/2020   Procedure: PERIPHERAL VASCULAR THROMBECTOMY - Left Leg;  Surgeon: Young Hensen, MD;  Location: [facility];  Service: Cardiovascular;  Laterality: N/A;    OB History   No obstetric history on file.      Home Medications    Prior to Admission medications   Medication Sig Start Date End Date Taking? Authorizing Provider  sulfamethoxazole -trimethoprim  (BACTRIM  DS) 800-160 MG tablet Take 1 tablet by mouth 2 (two) times daily for 7 days. 10/28/23 11/04/23 Yes Macsen Nuttall A, NP  atorvastatin  (LIPITOR) 80 MG tablet Take 1 tablet (80 mg total) by mouth daily. 10/06/22   Jinwala, Sagar H, MD  Blood Glucose Monitoring Suppl (ONETOUCH VERIO)  w/Device KIT 1 each by Does not apply route as needed. 05/23/22   Cleven Dallas, DO  dapagliflozin  propanediol (FARXIGA ) 5 MG TABS tablet Take 1 tablet (5 mg total) by mouth daily before breakfast. 03/23/23   Marni Sins, MD  glucose blood (ONETOUCH VERIO) test strip 1 each by Other route 3 (three) times daily. Use as instructed 05/23/22   Cleven Dallas, DO  Insulin  Glargine (BASAGLAR  [facility]) 100 UNIT/ML  Inject 32 Units into the skin daily. 10/28/23 11/27/23  Levora Reas A, NP  Insulin  Pen Needle (PEN NEEDLES) 32G X 4 MM MISC 1 Units by Does not apply route once a week. 05/22/22   Cleven Dallas, DO  Lancets 30G MISC 1 each by Does not apply route every 8 (eight) hours. 05/22/22   Cleven Dallas, DO  metFORMIN  (GLUCOPHAGE ) 1000 MG tablet Take 1 tablet (1,000 mg total) by mouth 2 (two) times daily with a meal. 10/06/22 10/06/23  Mandy Second, MD  Olmesartan -amLODIPine -HCTZ 40-10-25 MG TABS Take 1 tablet by mouth daily at 6 (six) AM. 10/06/22   Mandy Second, MD    Family History Family History  Problem Relation Age of Onset   Diabetes Mother    Diabetes Father    Colon cancer Neg Hx    Colon polyps Neg Hx    Esophageal cancer Neg Hx    Rectal cancer Neg Hx    Stomach cancer Neg Hx     Social History Social History   Tobacco Use   Smoking status: Never   Smokeless tobacco: Never  Vaping Use   Vaping status: Never Used  Substance Use Topics   Alcohol use: No   Drug use: No     Allergies   Codeine, Vicodin [hydrocodone -acetaminophen ], and Penicillins   Review of Systems Review of Systems  Per HPI  Physical Exam Triage Vital Signs ED Triage Vitals  Encounter Vitals Group     BP 10/28/23 1531 (!) 151/90     Systolic BP Percentile --      Diastolic BP Percentile --      Pulse Rate 10/28/23 1531 80     Resp 10/28/23 1531 20     Temp 10/28/23 1531 98.9 F (37.2 C)     Temp Source 10/28/23 1531 Oral     SpO2 10/28/23 1531 95 %     Weight --      Height --      Head Circumference --      Peak Flow --      Pain Score 10/28/23 1528 9     Pain Loc --      Pain Education --      Exclude from Growth Chart --    No data found.  Updated Vital Signs BP (!) 151/90 (BP Location: Right Arm)   Pulse 80   Temp 98.9 F (37.2 C) (Oral)   Resp 20   SpO2 95%   Visual Acuity Right Eye Distance:   Left Eye Distance:   Bilateral Distance:    Right Eye Near:    Left Eye Near:    Bilateral Near:     Physical Exam Vitals and nursing note reviewed.  Constitutional:      General: She is awake. She is not in acute distress.    Appearance: Normal appearance. She is well-developed and well-groomed. She is not ill-appearing.  Skin:    General: Skin is warm and dry.     Findings: Abscess present.  Comments: Approximately 2 1/2 cm indurated abscess noted to right buttock with central scabbed over wound. Without drainage at this time.   Neurological:     Mental Status: She is alert.  Psychiatric:        Behavior: Behavior is cooperative.      UC Treatments / Results  Labs (all labs ordered are listed, but only abnormal results are displayed) Labs Reviewed - No data to display  EKG   Radiology No results found.  Procedures Procedures (including critical care time)  Medications Ordered in UC Medications - No data to display  Initial Impression / Assessment and Plan / UC Course  I have reviewed the triage vital signs and the nursing notes.  Pertinent labs & imaging results that were available during my care of the patient were reviewed by me and considered in my medical decision making (see chart for details).     Patient is well-appearing. Vitals are stable. Upon assessment there is an approximately 2 1/2 cm indurated abscess noted to the right buttock with a central scabbed over wound. Without drainage at this time. Deferred I&D at this time due to induration.   Prescribed Bactrim  for abscess coverage. Refilled insulin  glargine for 30 days. Discussed follow-up and return precautions.  Final Clinical Impressions(s) / UC Diagnoses   Final diagnoses:  Abscess of buttock, right  Medication refill     Discharge Instructions      Start taking Bactrim  twice daily for 7 days for abscess coverage. Apply warm compresses and do warm water soaks to help promote drainage from th area. If you notice worsening pain, swelling, or  lots of pus-like-drainage from the area return here for re-evaluation.  I have refilled your Insulin  Glargine for 30 days. Please schedule a follow-up appointment with your primary care doctor for more refills of your insulin  and further management of your diabetes. Return here as needed.   ED Prescriptions     Medication Sig Dispense Auth. Provider   sulfamethoxazole -trimethoprim  (BACTRIM  DS) 800-160 MG tablet Take 1 tablet by mouth 2 (two) times daily for 7 days. 14 tablet Levora Reas A, NP   Insulin  Glargine (BASAGLAR  KWIKPEN) 100 UNIT/ML Inject 32 Units into the skin daily. 9.6 mL Levora Reas A, NP      PDMP not reviewed this encounter.   Levora Reas A, NP 10/28/23 [phone]

## 2023-11-25 ENCOUNTER — Ambulatory Visit: Admitting: Physician Assistant

## 2023-11-25 LAB — AMB RESULTS CONSOLE CBG: Glucose: 367

## 2023-11-25 NOTE — Progress Notes (Signed)
 Patient came to mobile screening at [facility]. BP highly elevated 180/108<180/116. Non fasting glucose 367. Patient was referred to mobile clinic immediately. Pt indicated some SDOH needs. Some community resources given.

## 2023-12-01 ENCOUNTER — Other Ambulatory Visit: Payer: Self-pay

## 2023-12-01 ENCOUNTER — Encounter: Payer: Self-pay | Admitting: Student

## 2023-12-01 ENCOUNTER — Ambulatory Visit: Payer: Self-pay | Admitting: Student

## 2023-12-01 VITALS — BP 153/82 | HR 72 | Temp 98.1°F | Ht 74.0 in | Wt 315.4 lb

## 2023-12-01 DIAGNOSIS — Z59819 Housing instability, housed unspecified: Secondary | ICD-10-CM

## 2023-12-01 DIAGNOSIS — I10 Essential (primary) hypertension: Secondary | ICD-10-CM | POA: Diagnosis not present

## 2023-12-01 DIAGNOSIS — Z8673 Personal history of transient ischemic attack (TIA), and cerebral infarction without residual deficits: Secondary | ICD-10-CM

## 2023-12-01 DIAGNOSIS — E1165 Type 2 diabetes mellitus with hyperglycemia: Secondary | ICD-10-CM

## 2023-12-01 DIAGNOSIS — Z7984 Long term (current) use of oral hypoglycemic drugs: Secondary | ICD-10-CM | POA: Diagnosis not present

## 2023-12-01 LAB — POCT GLYCOSYLATED HEMOGLOBIN (HGB A1C): HbA1c POC (<> result, manual entry): >14 % — AB (ref 4.0–5.6)

## 2023-12-01 LAB — GLUCOSE, CAPILLARY: Glucose-Capillary: 418 mg/dL — ABNORMAL HIGH (ref 70–99)

## 2023-12-01 MED ORDER — METFORMIN HCL 500 MG PO TABS
1000.0000 mg | ORAL_TABLET | Freq: Two times a day (BID) | ORAL | 11 refills | Status: AC
  Filled 2023-12-01: qty 120, 30d supply, fill #0

## 2023-12-01 MED ORDER — LISINOPRIL 20 MG PO TABS
20.0000 mg | ORAL_TABLET | Freq: Every day | ORAL | 11 refills | Status: AC
  Filled 2023-12-01 (×2): qty 30, 30d supply, fill #0

## 2023-12-01 NOTE — Assessment & Plan Note (Addendum)
 Blood pressure today is 168/77 and 153/82 on recheck. Patient has a tough time to afford her medications. She was previously on Olmesartan , amlodipine , and hydrochlorothiazide  but has not been taking it for quite some time. She has not been able to afford it. She does not check her blood pressure at home. She does not have a house to store her medications.  Given that she is having trouble affording medication, I walked over to the [facility] pharmacy to see if they can help.  They are able to help a little bit.  Will also talk with my pharmacy representative.  Will also get social support for patient with social work.  Plan: - Start lisinopril  20 mg daily - Stop combination medication as she has not been taking it - Follow-up in 2 weeks for close follow-up -If patient does show that she is able to know her medications and takes them regularly, could potentially add on a second antihypertensive agent as she does have stage II hypertension.  I do not think it was appropriate to added at this visit given that she does not know which medicine she is taking.  So starting 1 medicine was easier for her. -Patient needs 1 hour appointments

## 2023-12-01 NOTE — Progress Notes (Signed)
 CC: Follow up on HTN and and Diabetes   HPI:  [name] is a 53 y.o. female with a past medical history of prior CVA, DVT, hypertension, type 2 diabetes who presents for follow-up appointment.  Please see assessment and plan for full HPI.  Medications: Hypertension: Lisinopril  20 mg daily Diabetes mellitus: Metformin  1000 mg twice daily  Past Medical History:  Diagnosis Date   Diabetes mellitus without complication (HCC)    Encounter for hepatitis C screening test for low risk patient 05/01/2022   Encounter for screening for HIV 05/01/2022   Hypertension    Obesity    Stroke (HCC) 08/31/2021     Current Outpatient Medications:    lisinopril  (ZESTRIL ) 20 MG tablet, Take 1 tablet (20 mg total) by mouth daily., Disp: 30 tablet, Rfl: 11   Blood Glucose Monitoring Suppl (ONETOUCH VERIO) w/Device KIT, 1 each by Does not apply route as needed., Disp: 1 kit, Rfl: 0   glucose blood (ONETOUCH VERIO) test strip, 1 each by Other route 3 (three) times daily. Use as instructed, Disp: 100 each, Rfl: 12   Lancets 30G MISC, 1 each by Does not apply route every 8 (eight) hours., Disp: 100 each, Rfl: 11   metFORMIN  (GLUCOPHAGE ) 500 MG tablet, Take 2 tablets (1,000 mg total) by mouth 2 (two) times daily with a meal., Disp: 120 tablet, Rfl: 11  Review of Systems:    Negative except for what is stated in HPI   Physical Exam:  Vitals:   12/01/23 1539 12/01/23 1542  BP: (!) 168/77 (!) 153/82  Pulse: 75 72  Temp: 98.1 F (36.7 C)   TempSrc: Oral   SpO2: 99%   Weight: (!) 315 lb 6.4 oz (143.1 kg)   Height: 6' 2 (1.88 m)     General: Patient is sitting comfortably in the room  Head: Normocephalic, atraumatic  Cardio: Regular rate and rhythm, no murmurs, rubs or gallops Pulmonary: Clear to ausculation bilaterally with no rales, rhonchi, and crackles  Extremities: No edema noted to bilateral lower extremities.  Decreased sensation to bilateral lower extremities   Assessment &  Plan:   HTN (hypertension) Blood pressure today is 168/77 and 153/82 on recheck. Patient has a tough time to afford her medications. She was previously on Olmesartan , amlodipine , and hydrochlorothiazide  but has not been taking it for quite some time. She has not been able to afford it. She does not check her blood pressure at home. She does not have a house to store her medications.  Given that she is having trouble affording medication, I walked over to the [facility] pharmacy to see if they can help.  They are able to help a little bit.  Will also talk with my pharmacy representative.  Will also get social support for patient with social work.  Plan: - Start lisinopril  20 mg daily - Stop combination medication as she has not been taking it - Follow-up in 2 weeks for close follow-up -If patient does show that she is able to know her medications and takes them regularly, could potentially add on a second antihypertensive agent as she does have stage II hypertension.  I do not think it was appropriate to added at this visit given that she does not know which medicine she is taking.  So starting 1 medicine was easier for her. -Patient needs 1 hour appointments  Type 2 diabetes mellitus (HCC) Patient has a past medical history of diabetes.  She has not been taking her insulin   nor has she been taking her Farxiga  or metformin .  She is unable to afford any of her medications.  Her A1c today is greater than 14.  Her sugars are greater than 400.  She is unfortunately having issues affording medications and having housing instability.  She is also having food insecurity.  Sometimes she misses meals.  I do not think it is safe to keep her on insulin  at this time.  Given that she is unable to afford any of her medications, I talked with the [facility] pharmacy to see if they can help.  Will send the medications to one of her pharmacy.  Will also talk to my pharmacy representative.  Will also get social work  help.  Plan: - Ramp-up metformin  to 1000 mg twice daily - Discontinue all other medications - Follow-up in 2 weeks - CMP pending -For the next provider, patient can tolerate metformin  well and she is able to describe her medication regimen, I think it would be appropriate to add on an SGLT2 for her. -Patient needs 1 hour appointments  Housing situation unstable Very difficult situation.  Patient has no job, and has no money.  She does not have any support outside of the clinic.  She does have a son and a daughter, but does not get much support from them unfortunately.  She has trouble getting food at times.  She only can eat when her son and daughter help her.  She does live in her car currently.  She usually parks in her daughter's driveway and sleeps there overnight.  Will refer patient to social work for this.  Patient discussed with Dr. Amiel Balder, DO PGY-2 Internal Medicine Resident

## 2023-12-01 NOTE — Assessment & Plan Note (Signed)
 Very difficult situation.  Patient has no job, and has no money.  She does not have any support outside of the clinic.  She does have a son and a daughter, but does not get much support from them unfortunately.  She has trouble getting food at times.  She only can eat when her son and daughter help her.  She does live in her car currently.  She usually parks in her daughter's driveway and sleeps there overnight.  Will refer patient to social work for this.

## 2023-12-01 NOTE — Assessment & Plan Note (Addendum)
 Patient has a past medical history of diabetes.  She has not been taking her insulin  nor has she been taking her Farxiga  or metformin .  She is unable to afford any of her medications.  Her A1c today is greater than 14.  Her sugars are greater than 400.  She is unfortunately having issues affording medications and having housing instability.  She is also having food insecurity.  Sometimes she misses meals.  I do not think it is safe to keep her on insulin  at this time.  Given that she is unable to afford any of her medications, I talked with the [facility] pharmacy to see if they can help.  Will send the medications to one of her pharmacy.  Will also talk to my pharmacy representative.  Will also get social work help.  Plan: - Ramp-up metformin  to 1000 mg twice daily - Discontinue all other medications - Follow-up in 2 weeks - CMP pending -For the next provider, patient can tolerate metformin  well and she is able to describe her medication regimen, I think it would be appropriate to add on an SGLT2 for her. -Patient needs 1 hour appointments

## 2023-12-01 NOTE — Patient Instructions (Addendum)
 Brooke Hughes, [place] you for allowing me to take part in your care today.  Here are your instructions.  1. I am going to get my social worker to help you.  2. I have taken some labs, I am going to call you with the results  3. Please come back in 2 weeks.   4. Please go to the pharmacy across the hall to get your medications.   Metformin  schedule   I hope that you are doing well. I am messaging you in regards on how to ramp up the metformin . Below are the instructions.   Week 1: Start with metformin  500 mg (1 tablet) with breakfast.   Week 2: Continue to do 500 mg (1 tablet) with breakfast and start taking 500 mg (1 tablet) with dinner.    Week 3: Increase your morning dose to 1000 mg (2 tablets) with breakfast and continue 500 mg (1 tablet) with dinner.   Week 4 and on: Please do 1000 mg (2 tablets) in the morning with breakfast and 1000 mg (2 tablets) with dinner.   PLEASE BRING YOUR MEDICATIONS TO EVERY APPOINTMENT  Thank you, Dr. Lydia Sams  If you have any other questions please contact the internal medicine clinic at [phone] If it is after hours, please call the [facility] at [phone] and then ask the person who picks up for the resident on call.

## 2023-12-02 ENCOUNTER — Ambulatory Visit: Payer: Self-pay | Admitting: Student

## 2023-12-02 ENCOUNTER — Other Ambulatory Visit (HOSPITAL_COMMUNITY): Payer: Self-pay

## 2023-12-02 LAB — CMP14 + ANION GAP
ALT: 12 IU/L (ref 0–32)
AST: 10 IU/L (ref 0–40)
Albumin: 4.1 g/dL (ref 3.9–4.9)
Alkaline Phosphatase: 221 IU/L — ABNORMAL HIGH (ref 44–121)
Anion Gap: 15 mmol/L (ref 10.0–18.0)
BUN/Creatinine Ratio: 8 — ABNORMAL LOW (ref 9–23)
BUN: 8 mg/dL (ref 6–24)
Bilirubin Total: 0.2 mg/dL (ref 0.0–1.2)
CO2: 22 mmol/L (ref 20–29)
Calcium: 9.7 mg/dL (ref 8.7–10.2)
Chloride: 100 mmol/L (ref 96–106)
Creatinine, Ser: 0.97 mg/dL (ref 0.57–1.00)
Globulin, Total: 3.4 g/dL (ref 1.5–4.5)
Glucose: 404 mg/dL — ABNORMAL HIGH (ref 70–99)
Potassium: 3.9 mmol/L (ref 3.5–5.2)
Sodium: 137 mmol/L (ref 134–144)
Total Protein: 7.5 g/dL (ref 6.0–8.5)
eGFR: 71 mL/min/{1.73_m2} (ref >59)

## 2023-12-02 IMAGING — CR DG ELBOW COMPLETE 3+V*L*
4 series · 4 of 4 positions shown · non-contrast
Comparison: None.

CLINICAL DATA: Pain

EXAM:
LEFT ELBOW - COMPLETE 3+ VIEW

[x elbow ap left]
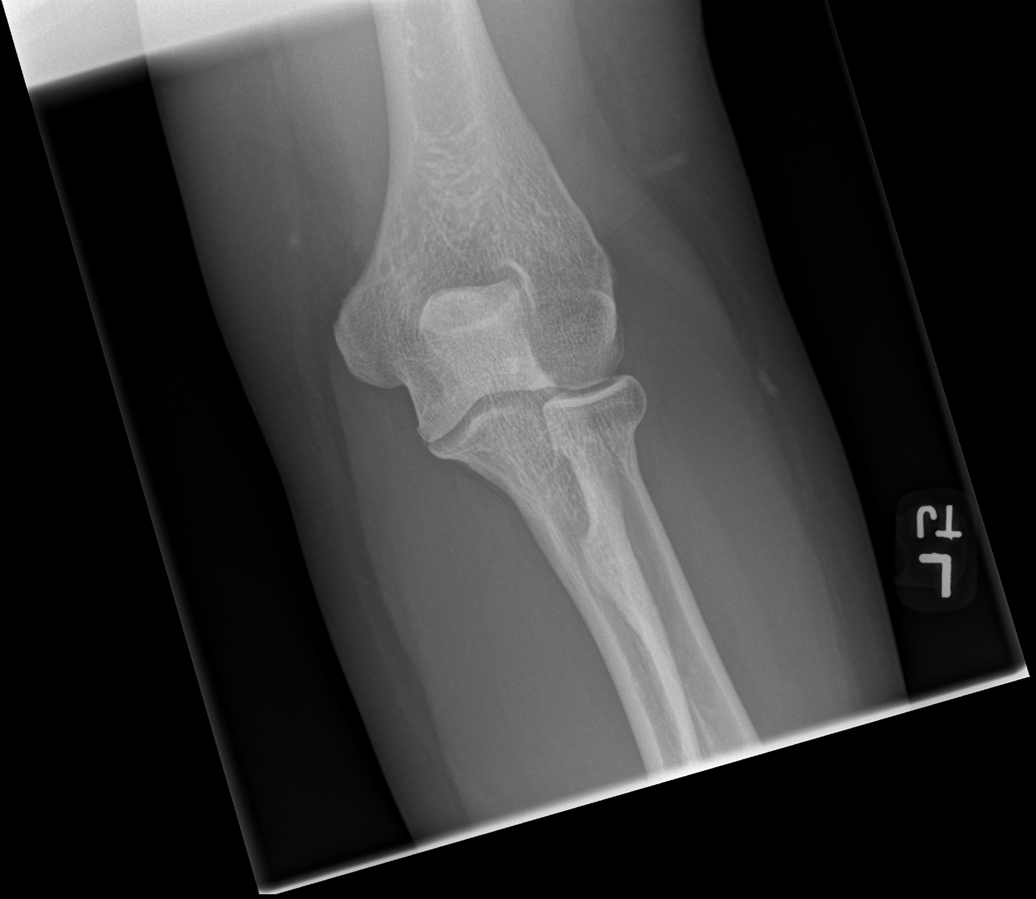

[x elbow obl left (1 of 2)]
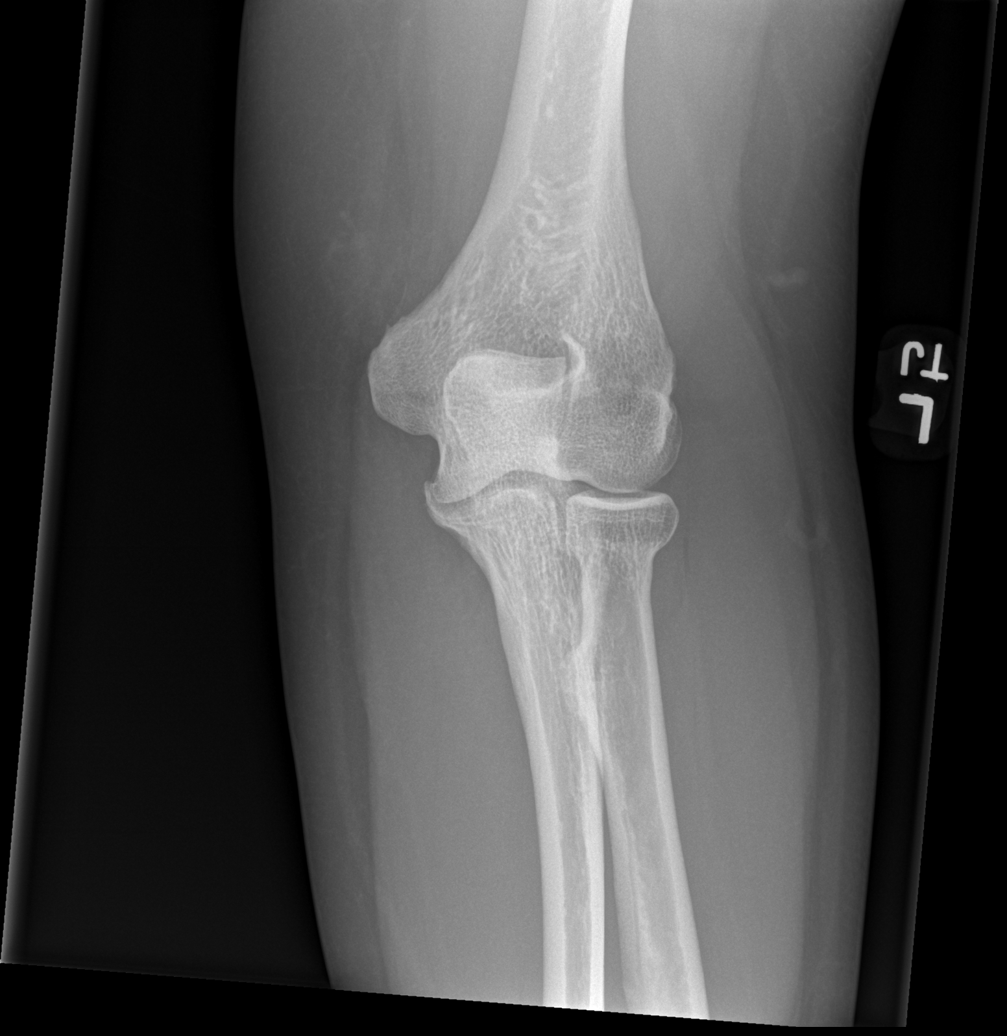

[x elbow obl left (2 of 2)]
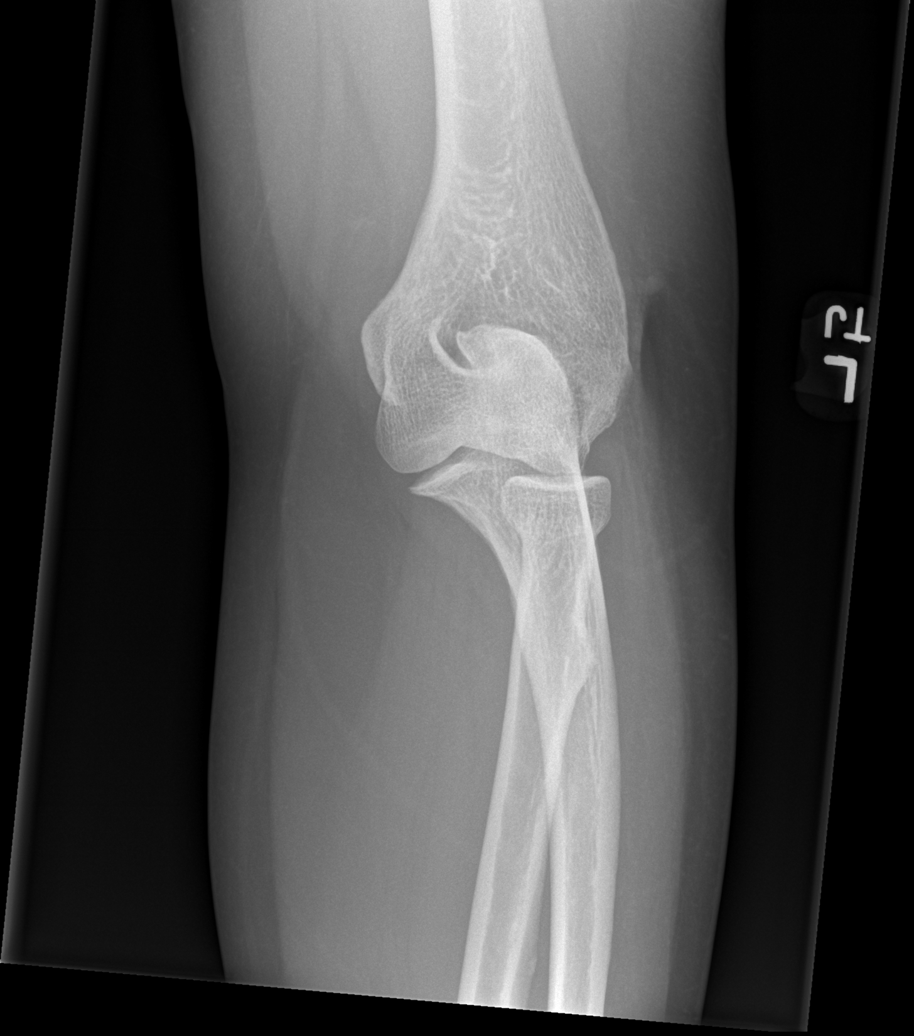

[x elbow lat left]
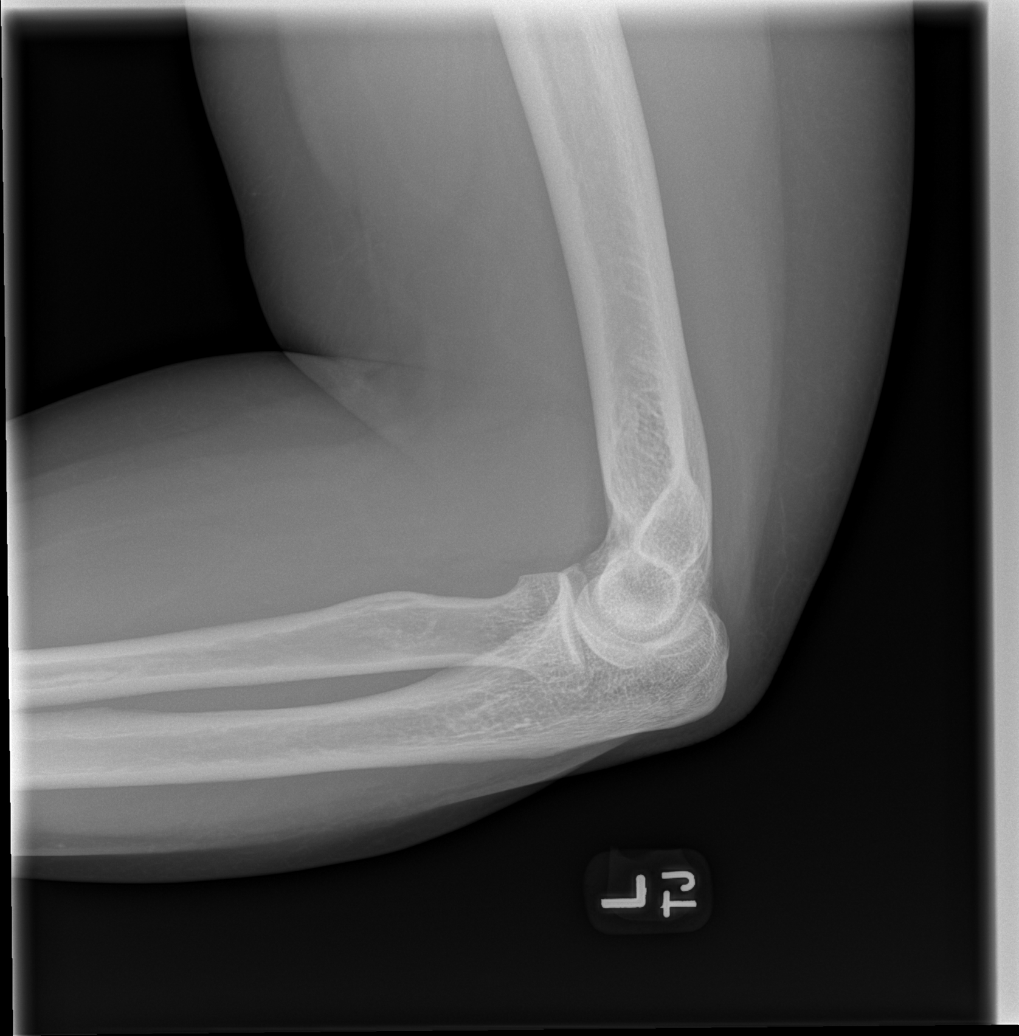

[4 of 4 positions shown; findings below may reference images not displayed]

FINDINGS: There is no evidence of fracture, dislocation, or joint effusion.
There is no evidence of arthropathy or other focal bone abnormality.
Soft tissues are unremarkable.
IMPRESSION: Negative left elbow radiographs.

## 2023-12-02 IMAGING — CT CT KNEE*L* W/O CM
3 series · 16 of 33 positions shown, 19 images · non-contrast
Comparison: Radiograph performed earlier on the same date

CLINICAL DATA: Knee trauma, tibial plateau fracture.

EXAM:
CT OF THE LEFT KNEE WITHOUT CONTRAST
TECHNIQUE: Multidetector CT imaging of the left knee was performed according to
the standard protocol. Multiplanar CT image reconstructions were
also generated.
RADIATION DOSE REDUCTION: This exam was performed according to the
departmental dose-optimization program which includes automated
exposure control, adjustment of the mA and/or kV according to
patient size and/or use of iterative reconstruction technique.

[Series 6: axial st · axial · 0.45mm/px · z∈[-1357,-1191]mm · 8 of 99 slices shown, 10 images]
[im 8/99  soft-tissue]
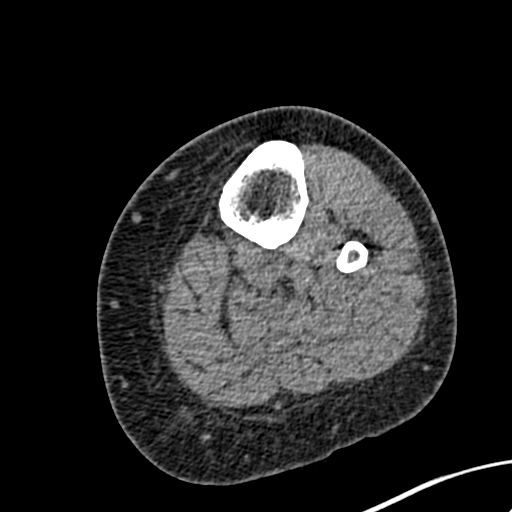
[im 8/99  bone]
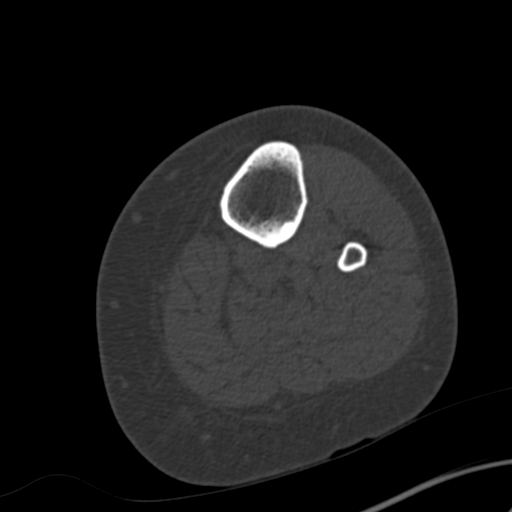
[im 23/99  bone]
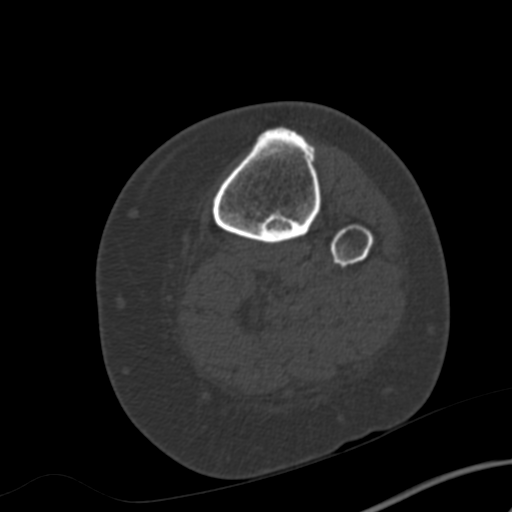
[im 31/99  bone]
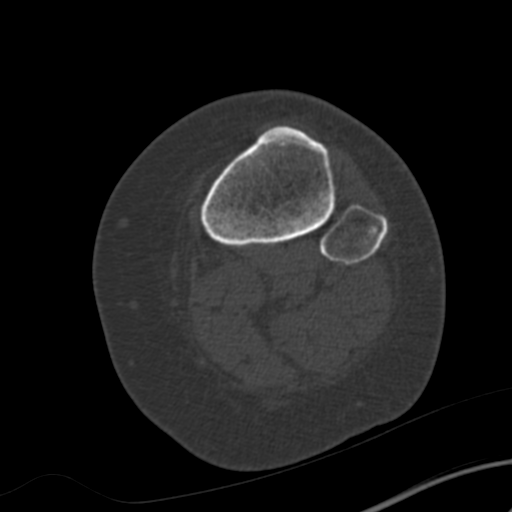
[im 46/99  bone]
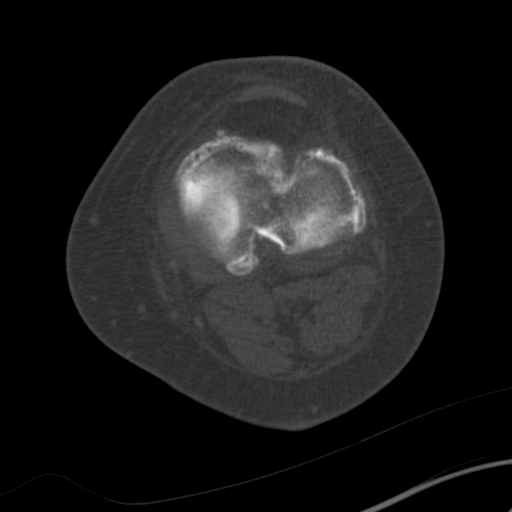
[im 53/99  soft-tissue]
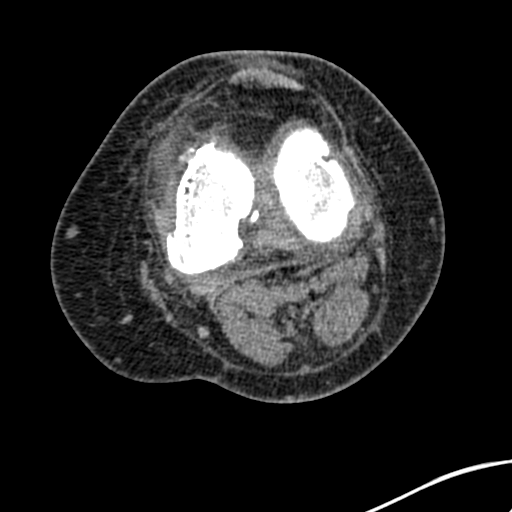
[im 53/99  bone]
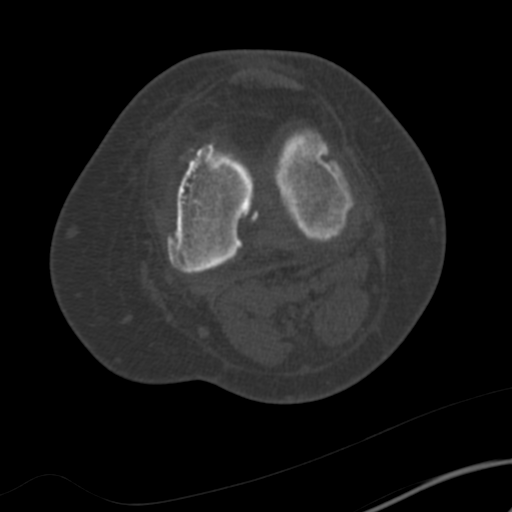
[im 68/99  bone]
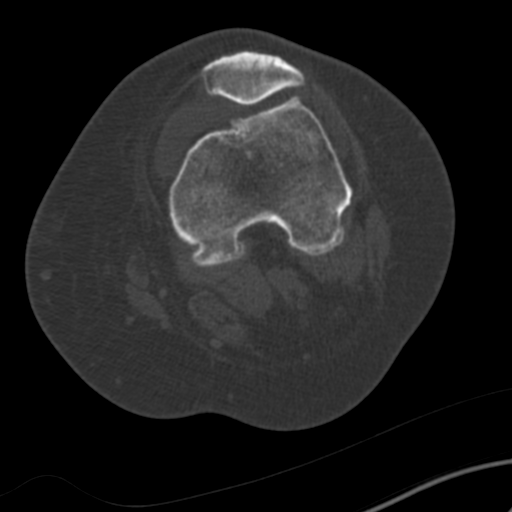
[im 76/99  bone]
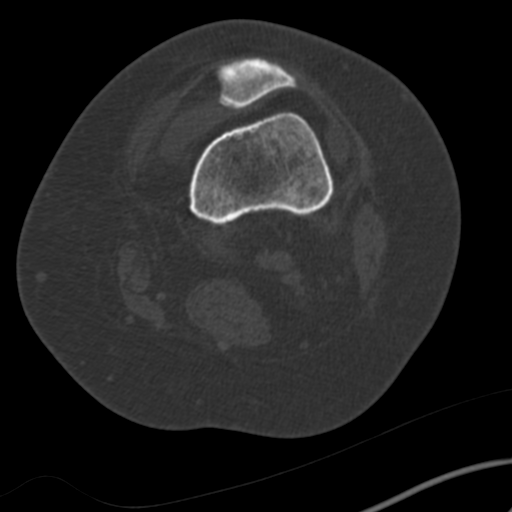
[im 91/99  bone]
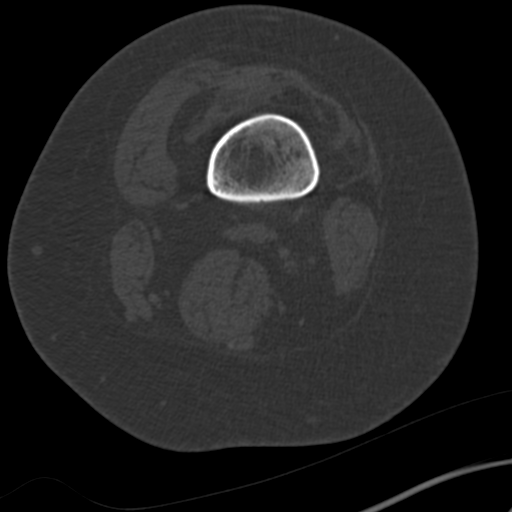

[Series 10: coronal st · coronal · 0.41mm/px · 3 of 106 slices shown]
[im 22/106  bone]
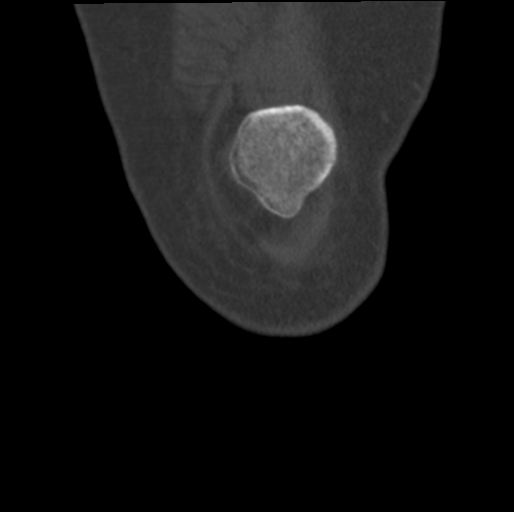
[im 43/106  bone]
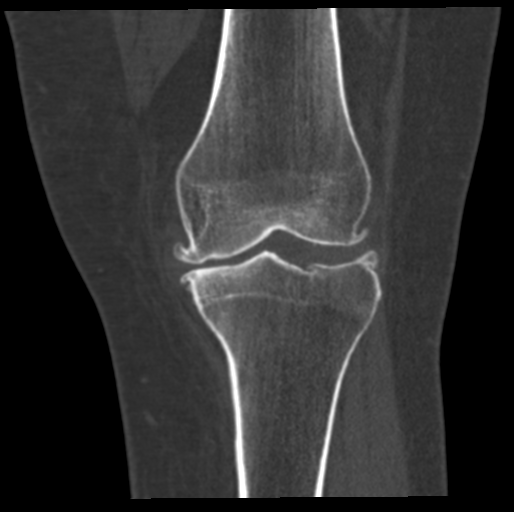
[im 64/106  bone]
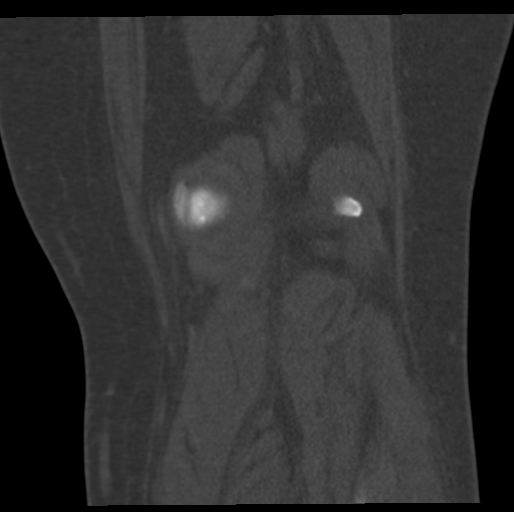

[Series 12: sagittal st · sagittal · 0.41mm/px · 5 of 90 slices shown, 6 images]
[im 30/90  bone]
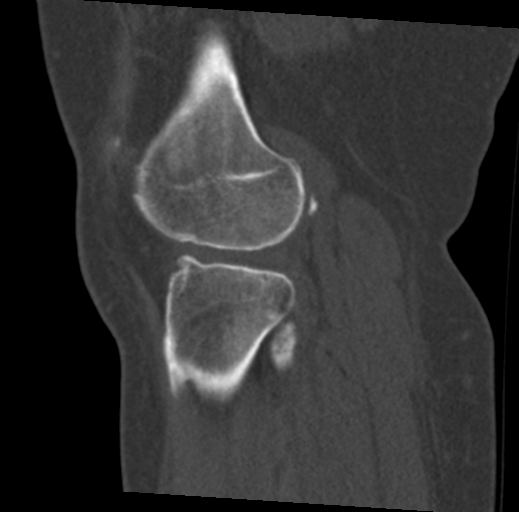
[im 38/90  bone]
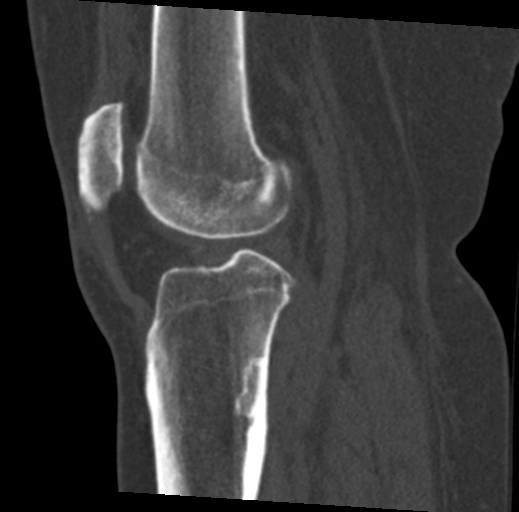
[im 45/90  soft-tissue]
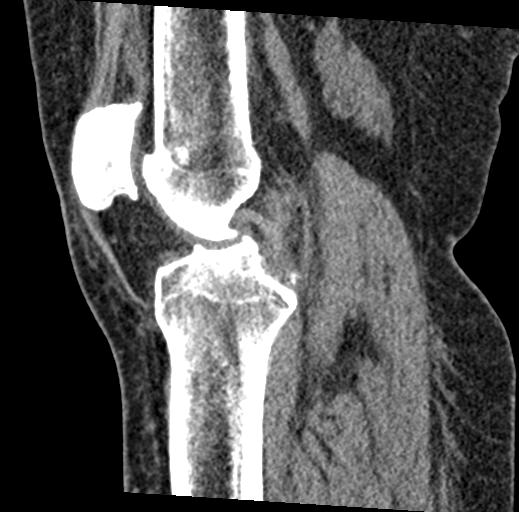
[im 45/90  bone]
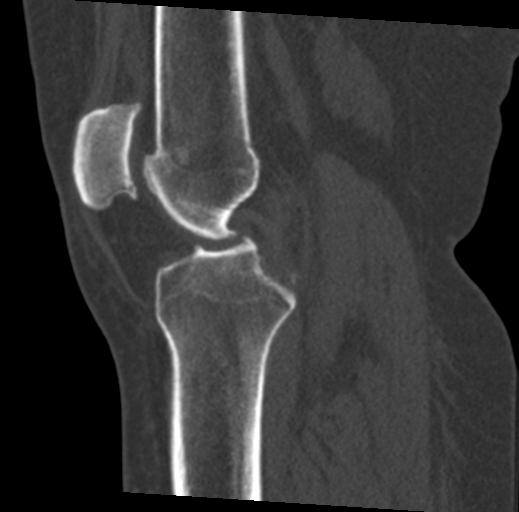
[im 52/90  bone]
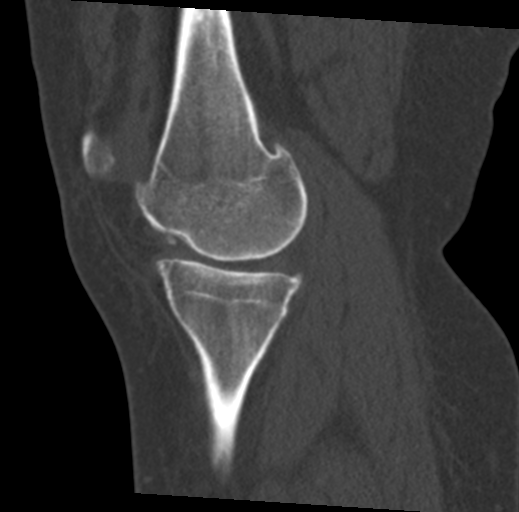
[im 60/90  bone]
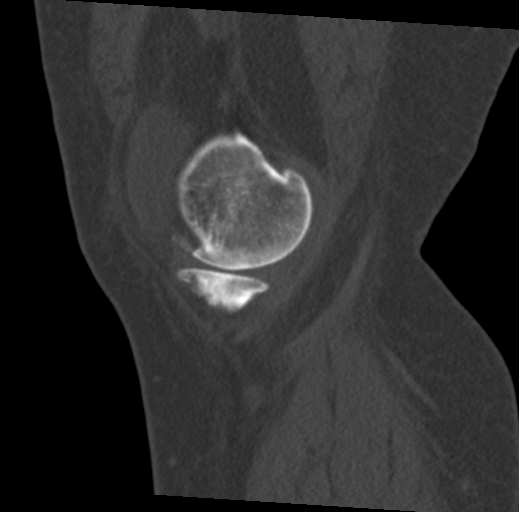

[16 of 33 positions shown; findings below may reference images not displayed]

FINDINGS: Bones/Joint/Cartilage

No fracture or dislocation. Normal alignment. Small suprapatellar
joint effusion. Moderate to severe tricompartmental joint space
narrowing with large marginal osteophytes.

Ligaments

Ligaments are suboptimally evaluated by CT.

Muscles and Tendons
Muscles are normal in bulk and density.  No intra muscular hematoma.

Soft tissue
No fluid collection or hematoma.  No soft tissue mass.
IMPRESSION: 1.  No evidence of fracture or dislocation.

2. Moderate to severe tricompartmental knee osteoarthritis with
joint space narrowing and prominent osteophytes. Moderate
suprapatellar joint effusion, likely reactive.

## 2023-12-02 IMAGING — CT CT HEAD W/O CM
3 series · 14 of 47 positions shown, 16 images · non-contrast
Comparison: 01/10/2020

CLINICAL DATA: Fall with head trauma.  Anticoagulation.



[Series 2: head wo · axial · 0.47mm/px · z∈[-159,-34]mm · 8 of 31 slices shown, 10 images]
[im 3/31  brain]
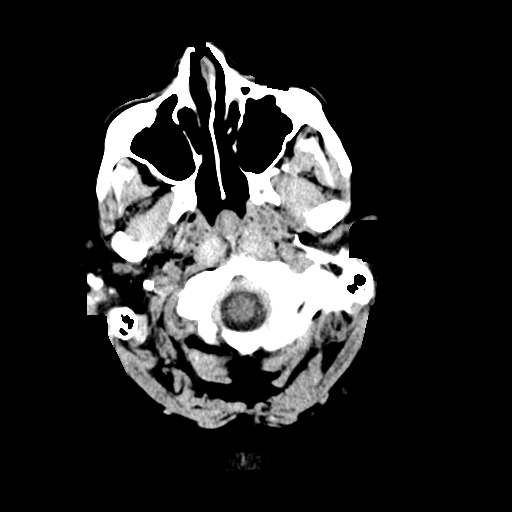
[im 3/31  bone]
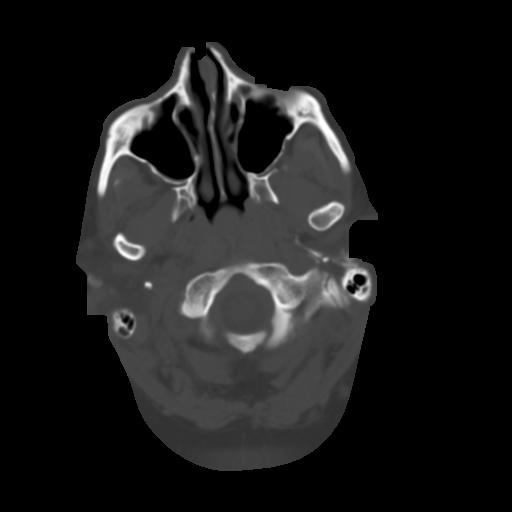
[im 7/31  brain]
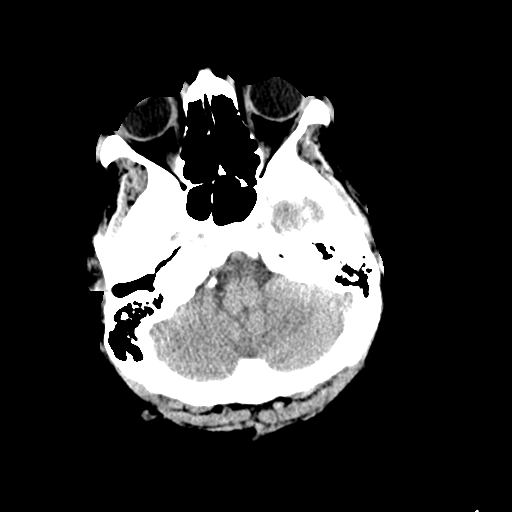
[im 10/31  brain]
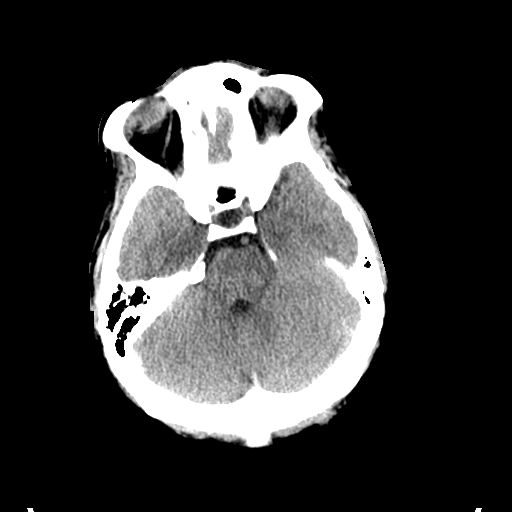
[im 14/31  brain]
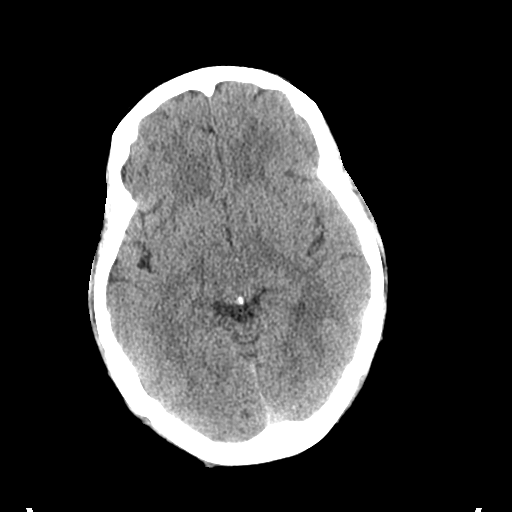
[im 17/31  brain]
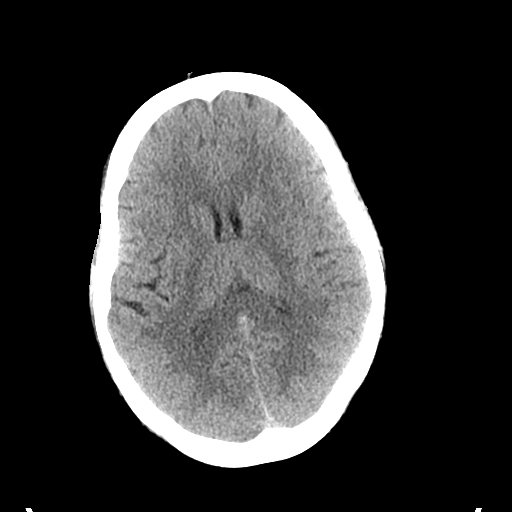
[im 17/31  bone]
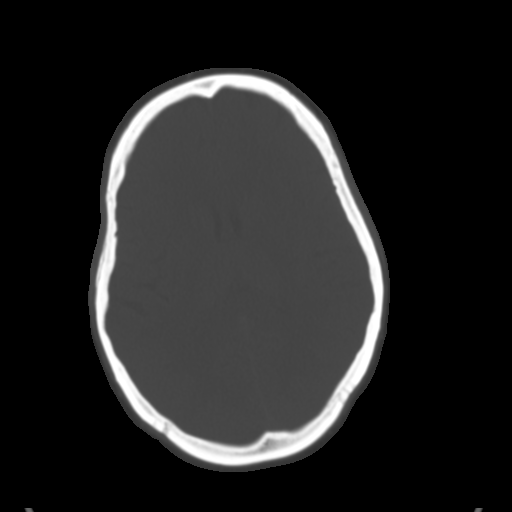
[im 21/31  brain]
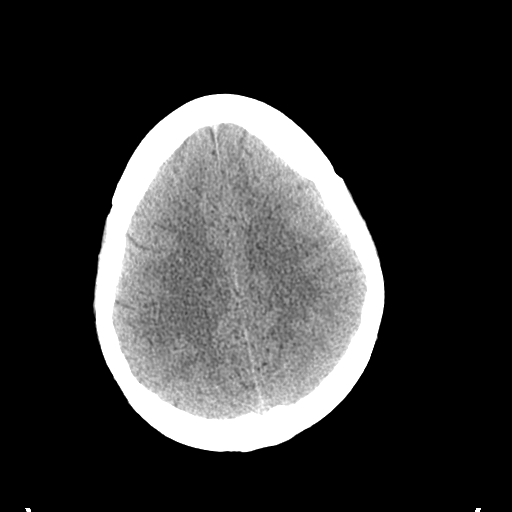
[im 24/31  brain]
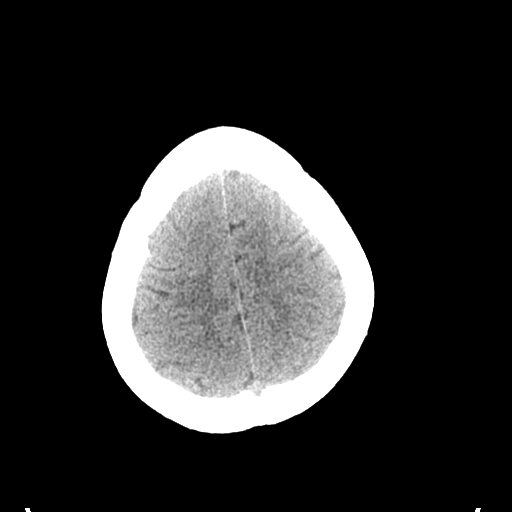
[im 28/31  brain]
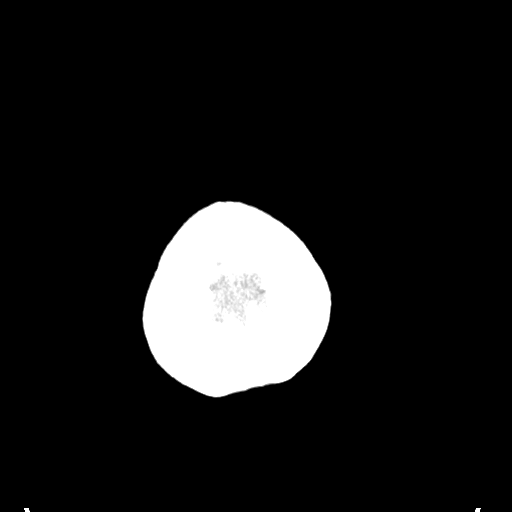

[Series 5: coronal soft tissue · coronal · 0.32mm/px · 3 of 72 slices shown]
[im 24/72  brain]
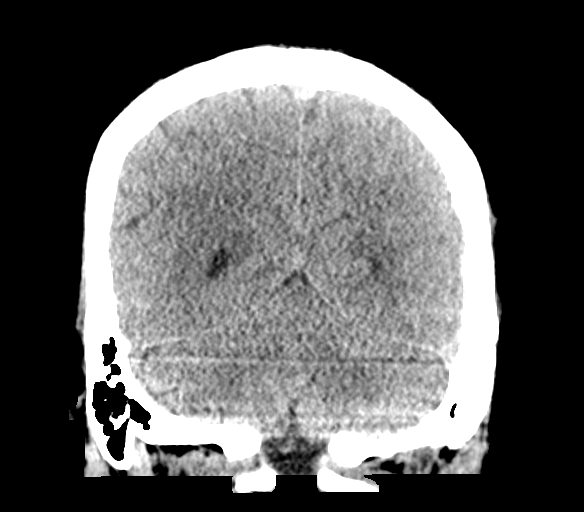
[im 32/72  brain]
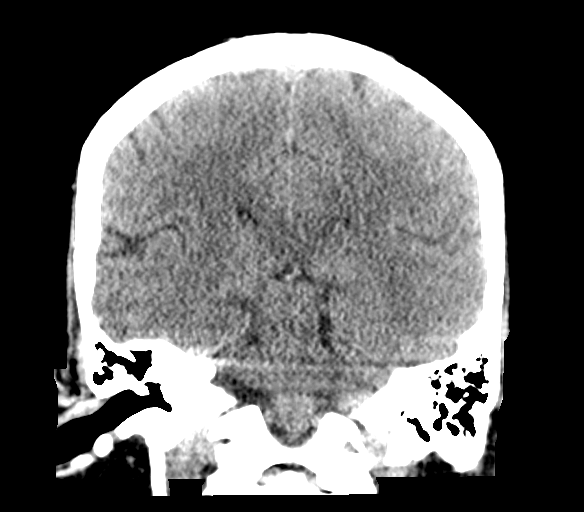
[im 40/72  brain]
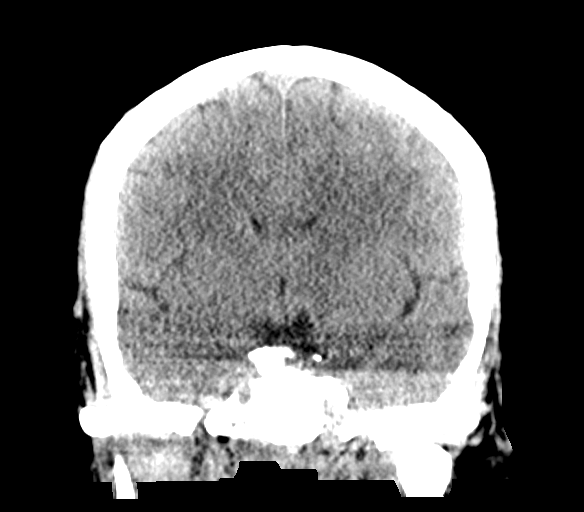

[Series 6: sagittal soft tissue · sagittal · 0.33mm/px · 3 of 60 slices shown]
[im 20/60  brain]
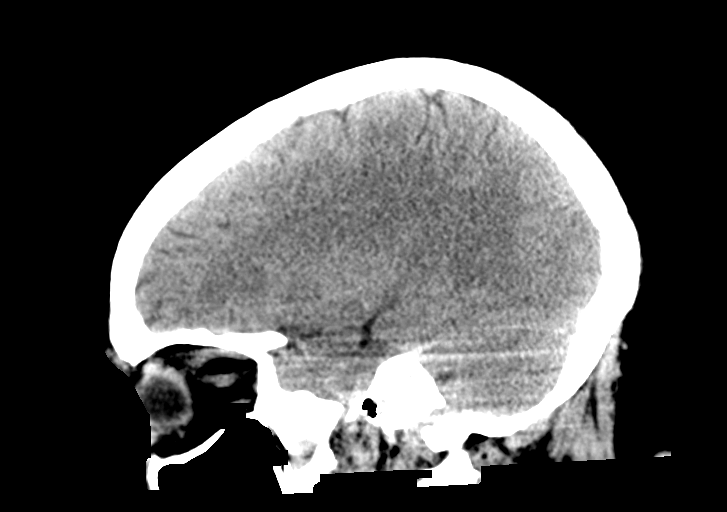
[im 30/60  brain]
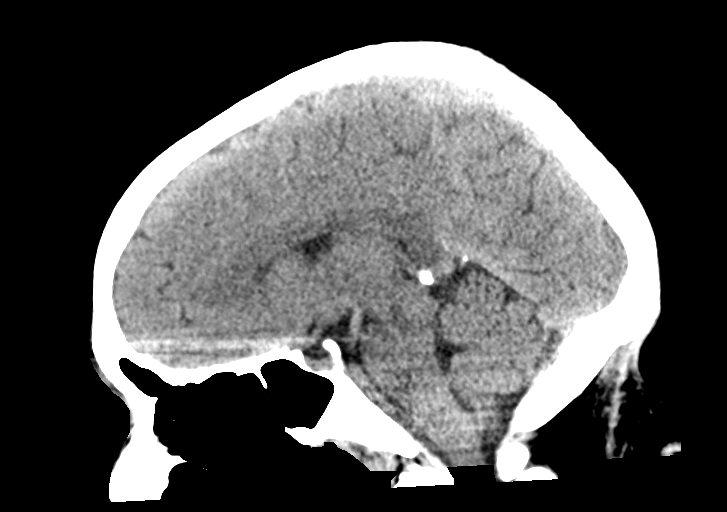
[im 40/60  brain]
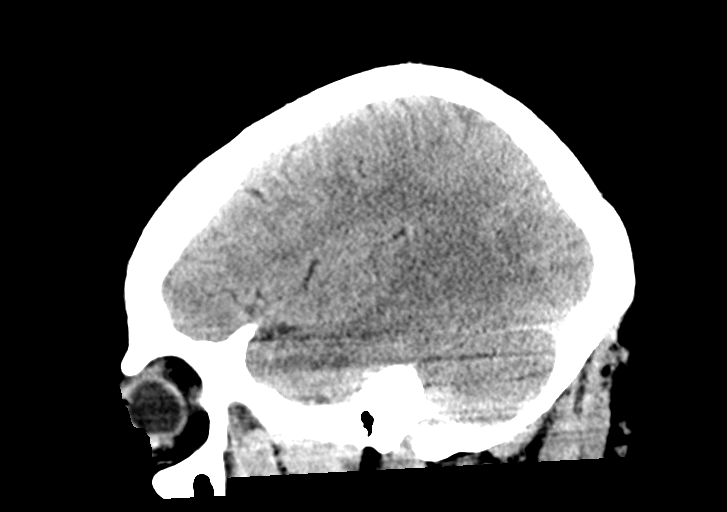

[14 of 47 positions shown; findings below may reference images not displayed]

FINDINGS: Brain: The brainstem, cerebellum, cerebral peduncles, thalami, basal
ganglia, basilar cisterns, and ventricular system appear within
normal limits. No intracranial hemorrhage, mass lesion, or acute
CVA.

Vascular: Unremarkable

Skull: The calvarium appears intact.

There appears to be a well corticated defect in the right
posteromedial ring of C1 image 2 of series 3. Given the well
corticated appearance, I am skeptical that this represents an acute
fracture and more likely at simply represents failure fusion of the
posterior arch of C1 adjacent to the lateral mass of C1. This level
was not quite included on the prior CT from 01/10/2020.

Sinuses/Orbits: Mild chronic right maxillary sinusitis.

Other: No supplemental non-categorized findings.
IMPRESSION: 1. No acute intracranial findings.
2. Well corticated defect in the right posterior ring of C1, quite
likely a congenital failure of fusion given the well corticated
margins. The well corticated margins would not be typical for a
fracture. Correlate with the patient's mechanism of injury in
determining whether CT scan of the cervical spine is warranted.

## 2023-12-02 IMAGING — CT CT CERVICAL SPINE W/O CM
3 of 4 series · 13 of 33 positions shown, 16 images · non-contrast
Comparison: None.

CLINICAL DATA: Trauma



[Series 9: orthogonal bone · axial · 0.23mm/px · z∈[-221,-116]mm · 5 of 83 slices shown, 7 images]
[im 14/83  soft-tissue]
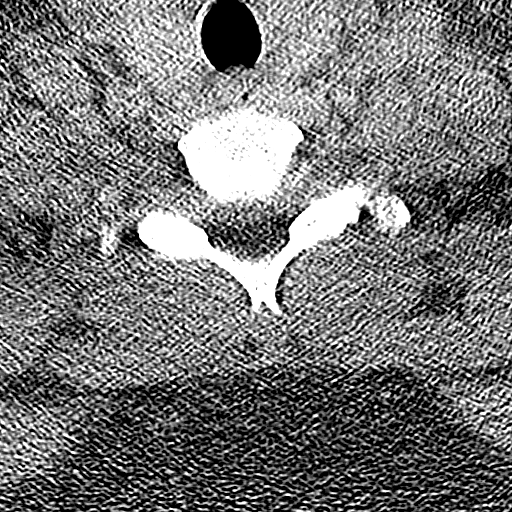
[im 14/83  bone]
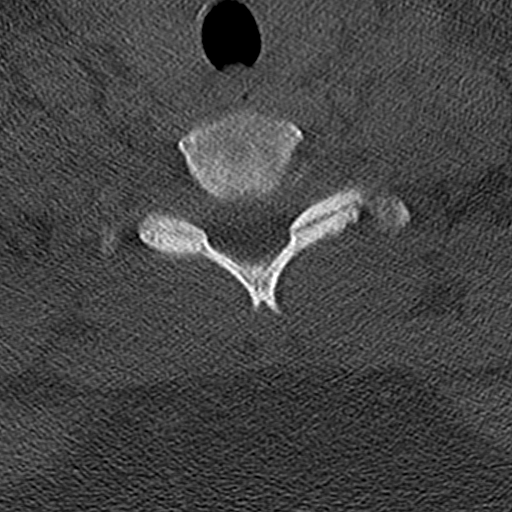
[im 28/83  bone]
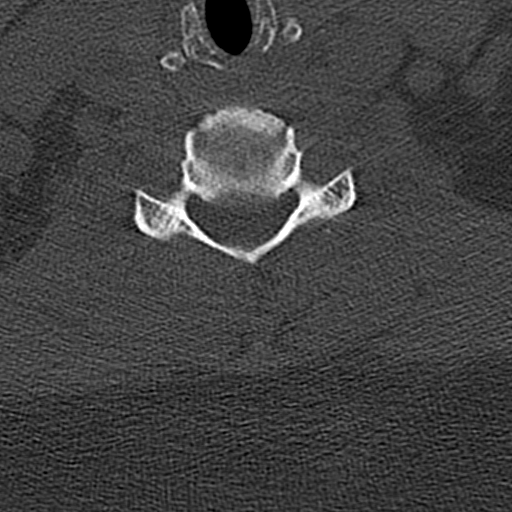
[im 42/83  bone]
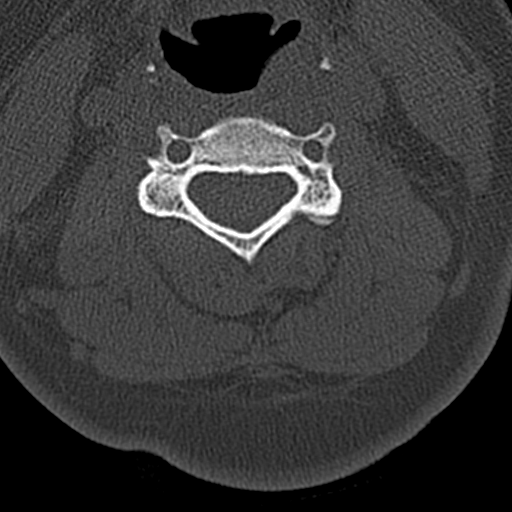
[im 55/83  bone]
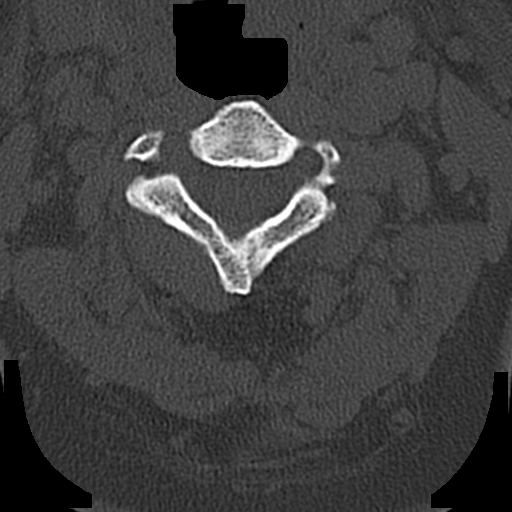
[im 69/83  soft-tissue]
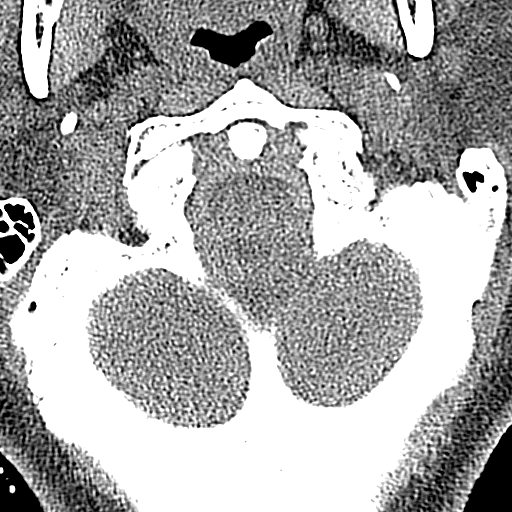
[im 69/83  bone]
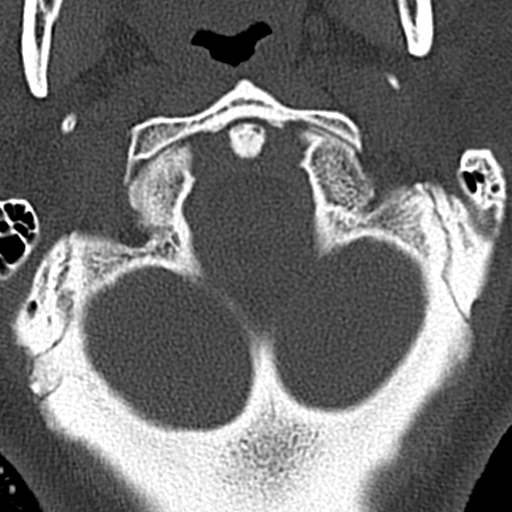

[Series 10: coronal bone · coronal · 0.23mm/px · 3 of 61 slices shown]
[im 13/61  bone]
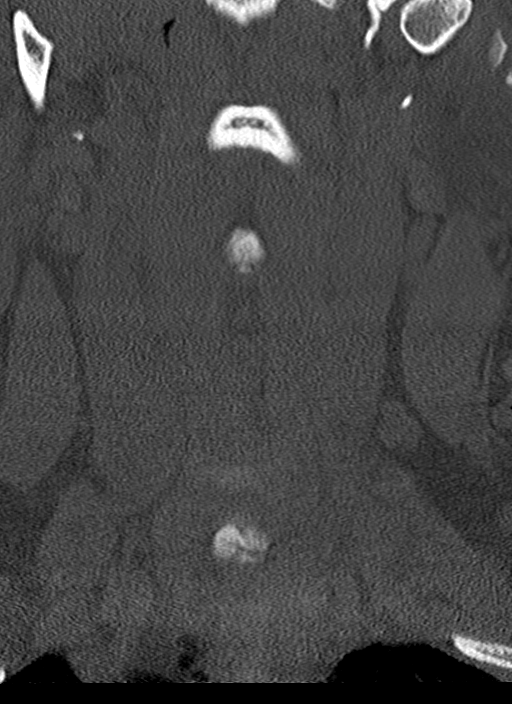
[im 25/61  bone]
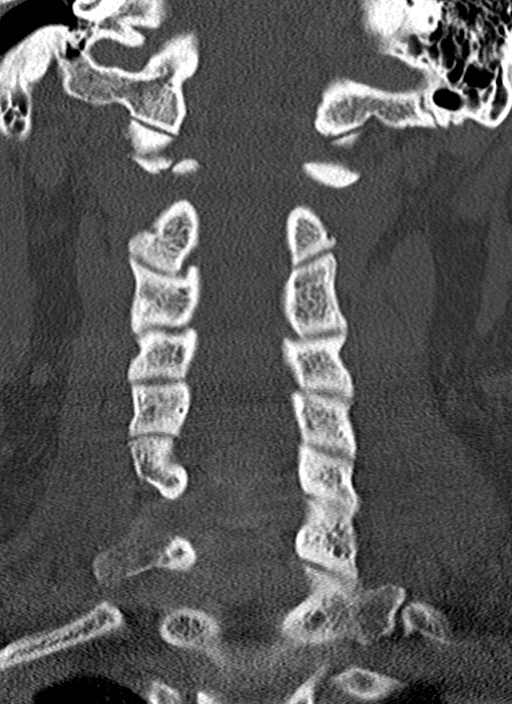
[im 37/61  bone]
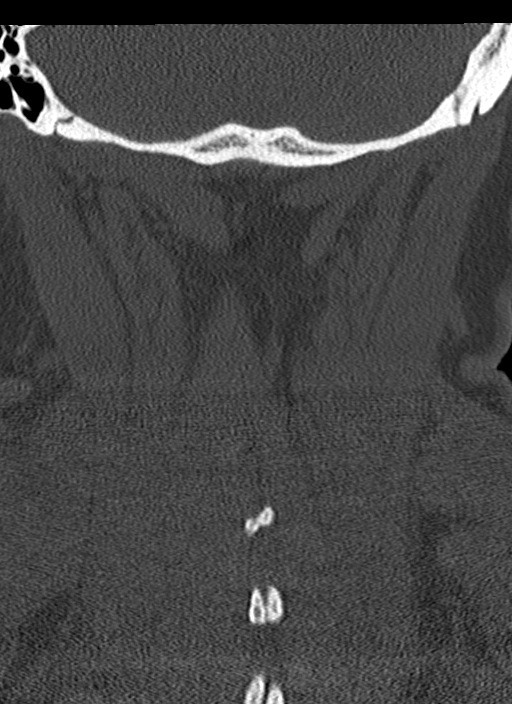

[Series 11: sagittal bone · sagittal · 0.23mm/px · 5 of 61 slices shown, 6 images]
[im 21/61  bone]
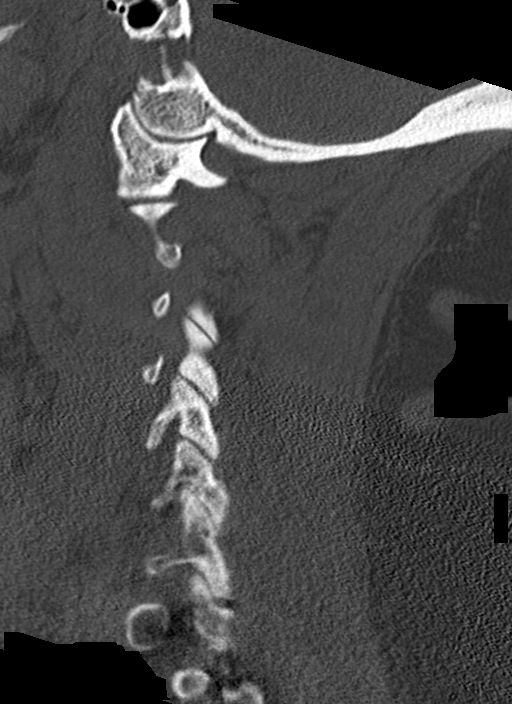
[im 26/61  bone]
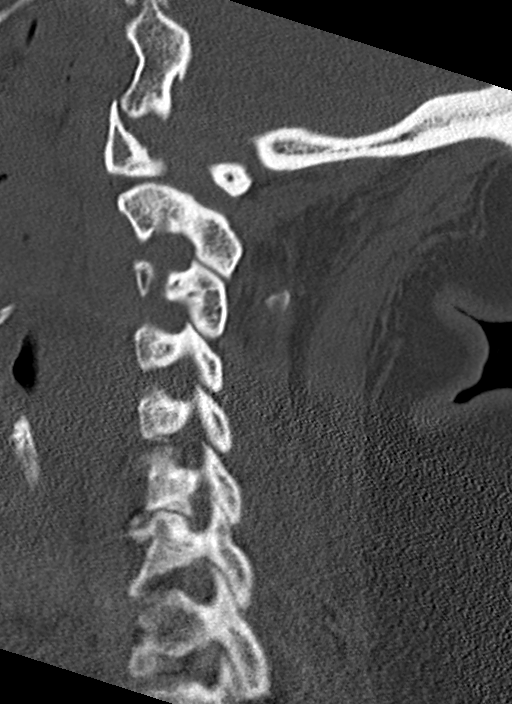
[im 31/61  soft-tissue]
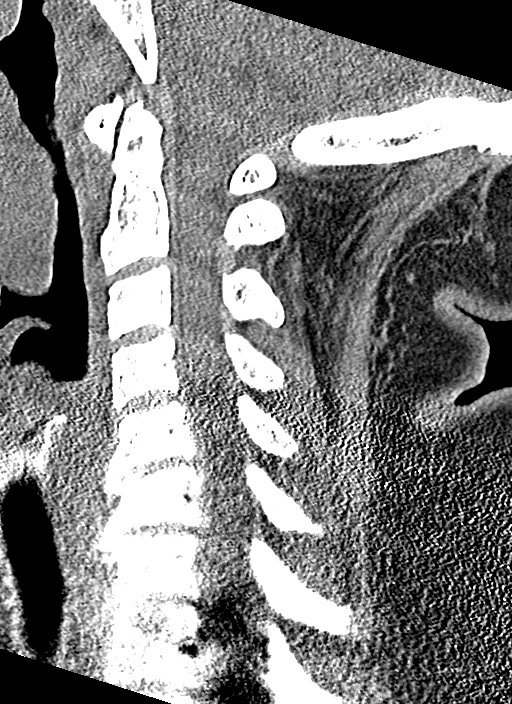
[im 31/61  bone]
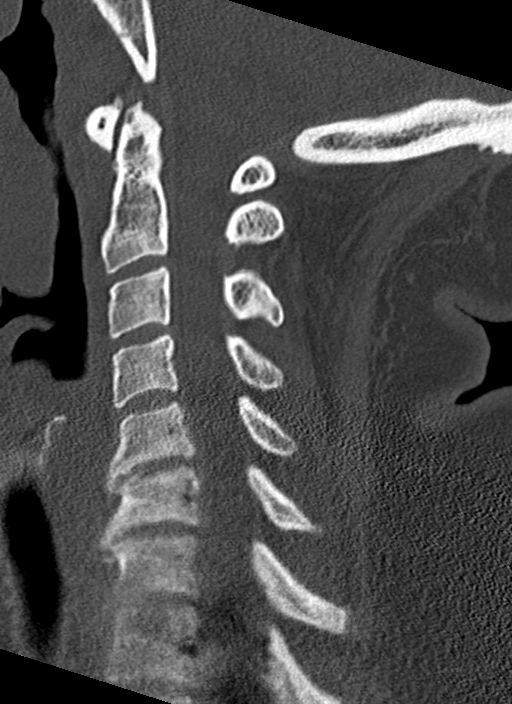
[im 36/61  bone]
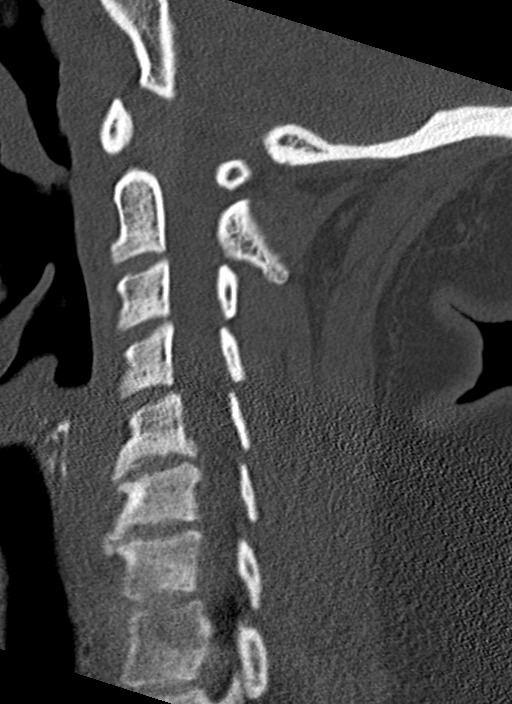
[im 41/61  bone]
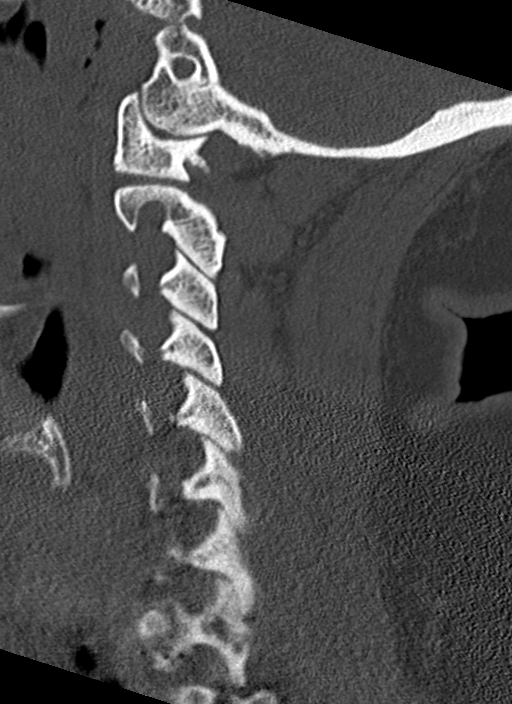

[13 of 33 positions shown; findings below may reference images not displayed]

FINDINGS: Alignment: No static subluxation. Facets are aligned. Occipital
condyles and the lateral masses of C1 and C2 are normally
approximated.

Skull base and vertebrae: No acute fracture.

Soft tissues and spinal canal: No prevertebral fluid or swelling. No
visible canal hematoma.

Disc levels: C5-7 degenerative disc disease.

Upper chest: No pneumothorax, pulmonary nodule or pleural effusion.

Other: Normal visualized paraspinal cervical soft tissues.
IMPRESSION: No acute fracture or static subluxation of the cervical spine.

## 2023-12-02 NOTE — Telephone Encounter (Signed)
-----   Message from Jonelle Neri sent at 12/01/2023  5:20 PM EDT ----- Patient has a very tough social situation. She is not able to afford any of her medications and is having a hard time controlling all of her chronic conditions due to this. I have had to restart he medications as she has not been taking any of her medications for well over 6 months probably. She lives out of her car. Could we get her patient assistance for maybe jardiance or farxiga . Could we get her resources for housing and finances. She has a lot of things that we need to do for her. I wanted to keep you all in the loop. I talked to the [facility] about her as well and we can get some of the medications through there for a cheaper cost.

## 2023-12-03 NOTE — Progress Notes (Signed)
 Internal Medicine Clinic Attending  Case discussed with the resident at the time of the visit.  We reviewed the resident's history and exam and pertinent patient test results.  I agree with the assessment, diagnosis, and plan of care documented in the resident's note.   Challenging situation with many SDOH impacting care. I agree with focusing on housing resources & affording medicines, with close follow up

## 2023-12-10 ENCOUNTER — Ambulatory Visit (INDEPENDENT_AMBULATORY_CARE_PROVIDER_SITE_OTHER): Admitting: Licensed Clinical Social Worker

## 2023-12-10 DIAGNOSIS — R4589 Other symptoms and signs involving emotional state: Secondary | ICD-10-CM

## 2023-12-10 NOTE — BH Specialist Note (Signed)
 Integrated Behavioral Health via Telemedicine Visit  12/10/2023 Brooke Hughes [id number]  Number of Integrated Behavioral Health Clinician visits: Additional Visit  Session Start time: 1430   Session End time: 1500  Total time in minutes: 30    Referring Provider: PCP Patient/Family location: Home [facility] Provider location: Office All persons participating in visit: [facility] and Patient Types of Service: Telephone visit  I connected with Brooke Hughes via  Telephone  and verified that I am speaking with the correct person using two identifiers. Discussed confidentiality: Yes   I discussed the limitations of telemedicine and the availability of in person appointments.  Discussed there is a possibility of technology failure and discussed alternative modes of communication if that failure occurs.  I discussed that engaging in this telemedicine visit, they consent to the provision of behavioral healthcare and the services will be billed under their insurance.  Patient and/or legal guardian expressed understanding and consented to Telemedicine visit: Yes   Presenting Concerns: Patient and/or family reports the following symptoms/concerns: Initial consult conducted on today. Patient was unable to attend her scheduled in-person visit today. [facility] contacted the patient by phone and briefly introduced self. During the conversation, the patient denied being a current client of [facility] and reported that she is not taking any mental health medications at this time.  The patient endorsed a history of depression but denied any other mental health diagnoses. She expressed a preference for talk therapy and therapeutic interventions rather than medication management.  The patient requested to be seen by the [facility] Behavioral Health Clinician and was scheduled for an in-person appointment on 07/17 at 8:45 a.m.  No safety concerns were reported during the call.    Patient and/or Family Response:  Patient agrees to services  Clinical Assessment/Diagnosis  Depressed mood    Assessment: Patient currently experiencing Depression.   Patient may benefit from Individual Counseling.  Plan: Follow up with behavioral health clinician on : 07/18; In-person at 8:45 am  I discussed the assessment and treatment plan with the patient and/or parent/guardian. They were provided an opportunity to ask questions and all were answered. They agreed with the plan and demonstrated an understanding of the instructions.   They were advised to call back or seek an in-person evaluation if the symptoms worsen or if the condition fails to improve as anticipated.  [name], MSW, LCSW-A She/Her Behavioral Health Clinician [facility]  Internal Medicine Center

## 2023-12-14 ENCOUNTER — Ambulatory Visit: Admitting: Student

## 2023-12-14 ENCOUNTER — Other Ambulatory Visit (HOSPITAL_COMMUNITY): Payer: Self-pay

## 2023-12-14 VITALS — BP 148/95 | HR 71 | Temp 98.5°F | Ht 74.0 in | Wt 310.2 lb

## 2023-12-14 DIAGNOSIS — I10 Essential (primary) hypertension: Secondary | ICD-10-CM

## 2023-12-14 DIAGNOSIS — Z7984 Long term (current) use of oral hypoglycemic drugs: Secondary | ICD-10-CM

## 2023-12-14 DIAGNOSIS — Z1231 Encounter for screening mammogram for malignant neoplasm of breast: Secondary | ICD-10-CM

## 2023-12-14 DIAGNOSIS — E1165 Type 2 diabetes mellitus with hyperglycemia: Secondary | ICD-10-CM

## 2023-12-14 DIAGNOSIS — R4589 Other symptoms and signs involving emotional state: Secondary | ICD-10-CM | POA: Diagnosis not present

## 2023-12-14 DIAGNOSIS — Z139 Encounter for screening, unspecified: Secondary | ICD-10-CM

## 2023-12-14 DIAGNOSIS — Z59819 Housing instability, housed unspecified: Secondary | ICD-10-CM | POA: Diagnosis not present

## 2023-12-14 DIAGNOSIS — B351 Tinea unguium: Secondary | ICD-10-CM | POA: Diagnosis not present

## 2023-12-14 LAB — GLUCOSE, CAPILLARY: Glucose-Capillary: 302 mg/dL — ABNORMAL HIGH (ref 70–99)

## 2023-12-14 MED ORDER — DAPAGLIFLOZIN PROPANEDIOL 10 MG PO TABS
10.0000 mg | ORAL_TABLET | Freq: Every day | ORAL | 3 refills | Status: DC
  Filled 2023-12-14: qty 90, 90d supply, fill #0

## 2023-12-14 MED ORDER — DAPAGLIFLOZIN PROPANEDIOL 10 MG PO TABS
10.0000 mg | ORAL_TABLET | Freq: Every day | ORAL | 3 refills | Status: AC
  Filled 2023-12-14: qty 90, 90d supply, fill #0

## 2023-12-14 NOTE — Patient Instructions (Signed)
 Thank you, Ms.Sue CHRISTELLA Hahn for allowing us  to provide your care today. Today we discussed:  For your blood pressure - Please return back to the clinic in 1 week for a blood pressure check, please make sure you are taking your medications daily. - If your blood pressure remains high even with taking your blood pressure medication, we will likely increase your medication.  Prediabetes - Please continue taking metformin  500 mg 2 tablets by mouth 2 times daily - Please start Farxiga  10 mg, 1 tablet by mouth daily - I sent a referral for an eye doctor so they can get your eyes checked - I sent referral for your foot doctor so they can check your feet and the toes  I also sent an order so we can get your mammogram done, they will call you to schedule an appointment  I also sent a referral to help with your housing needs  I have ordered the following labs for you:   Lab Orders         Microalbumin / Creatinine Urine Ratio      Tests ordered today:  None  Referrals ordered today:   Referral Orders  No referral(s) requested today     I have ordered the following medication/changed the following medications:   Stop the following medications: There are no discontinued medications.   Start the following medications: No orders of the defined types were placed in this encounter.    Follow up: 1 month with physician for HTN, DM. 1 week with RN for BP check     Remember:   Should you have any questions or concerns please call the internal medicine clinic at [phone].     Rayann Atway, D.O. [facility] Internal Medicine Center  '

## 2023-12-14 NOTE — Progress Notes (Unsigned)
 Established Patient Office Visit  Subjective   Patient ID: Brooke Hughes, female    DOB: 04-07-1971  Age: 53 y.o. MRN: [id number]  Chief Complaint  Patient presents with   Hypertension    Blood pressure check (did not take meds this am)    HPI This is a 53 year old female with past medical history of prior CVA, DVT, hypertension, type 2 diabetes who presents today for blood pressure follow-up, after her last office visit on 12/01/2023.  ROS  AS per assessment and plan  Objective:     BP (!) 148/95 (BP Location: Right Arm, Patient Position: Sitting, Cuff Size: Normal)   Pulse 71   Temp 98.5 F (36.9 C) (Oral)   Ht 6' 2 (1.88 m)   Wt (!) 310 lb 3.2 oz (140.7 kg)   SpO2 96%   BMI 39.83 kg/m  BP Readings from Last 3 Encounters:  12/14/23 (!) 148/95  12/01/23 (!) 153/82  11/25/23 (!) 180/116   Wt Readings from Last 3 Encounters:  12/14/23 (!) 310 lb 3.2 oz (140.7 kg)  12/01/23 (!) 315 lb 6.4 oz (143.1 kg)  03/23/23 (!) 331 lb 11.2 oz (150.5 kg)   SpO2 Readings from Last 3 Encounters:  12/14/23 96%  12/01/23 99%  10/28/23 95%      Physical Exam  General: Sitting in chair, no acute distress Cardiovascular: Regular rate Pulmonary: Breathing comfortably Abdomen: Soft, nontender, nondistended MSK: No lower extremity edema bilaterally Psych: Pleasant Results for orders placed or performed in visit on 12/14/23  Glucose, capillary  Result Value Ref Range   Glucose-Capillary 302 (H) 70 - 99 mg/dL    Last metabolic panel Lab Results  Component Value Date   GLUCOSE 404 (H) 12/01/2023   NA 137 12/01/2023   K 3.9 12/01/2023   CL 100 12/01/2023   CO2 22 12/01/2023   BUN 8 12/01/2023   CREATININE 0.97 12/01/2023   EGFR 71 12/01/2023   CALCIUM  9.7 12/01/2023   PROT 7.5 12/01/2023   ALBUMIN 4.1 12/01/2023   LABGLOB 3.4 12/01/2023   BILITOT 0.2 12/01/2023   ALKPHOS 221 (H) 12/01/2023   AST 10 12/01/2023   ALT 12 12/01/2023   ANIONGAP 9 08/01/2020   Last  lipids No results found for: CHOL, HDL, LDLCALC, LDLDIRECT, TRIG, CHOLHDL Last hemoglobin A1c Lab Results  Component Value Date   HGBA1C >14.0 (A) 12/01/2023      The ASCVD Risk score (Arnett DK, et al., 2019) failed to calculate for the following reasons:   Risk score cannot be calculated because patient has a medical history suggesting prior/existing ASCVD    Assessment & Plan:  Patient is discussed with Dr Lovie    Problem List Items Addressed This Visit       Cardiovascular and Mediastinum   HTN (hypertension)   BP Readings from Last 3 Encounters:  12/14/23 (!) 148/95  12/01/23 (!) 153/82  11/25/23 (!) 180/116   Per the last office visit, on 12/01/2023, patient had trouble with affording medications and financial situation.  She was started on lisinopril  20 mg daily, patient reports that she did not take her medication this morning, as she woke up at 12 PM.  Reports that she does not have a blood pressure cuff at the home, so she could check her blood pressures.  She denies any symptoms of dizziness, lightheadedness, blurry vision, chest pain or shortness of breath. -Advised to return back to clinic in 1 week for a RN blood pressure check -If blood pressure  is elevated, plan to increase lisinopril  to the next highest dose will consider adding an additional agent (hydrochlorothiazide )        Endocrine   Type 2 diabetes mellitus (HCC)   Lab Results  Component Value Date   HGBA1C >14.0 (A) 12/01/2023   Per the last episode, patient had trouble affording medications, she does not have a job and financial support.  She does not have any stable housing, to keep insulin  in her fridge.  Patient reports that she was previously on insulin , however Medicaid has stopped paying for it.  Presently she is taking metformin  1000 mg twice daily. -Continue metformin  1000 mg twice daily - Will check urine ACR, start Farxiga  10 mg daily -Referral to ophthalmology is placed -Foot  exam in office by RN, pulses present      Relevant Medications   dapagliflozin  propanediol (FARXIGA ) 10 MG TABS tablet   Other Relevant Orders   Ambulatory referral to Ophthalmology   Microalbumin / Creatinine Urine Ratio     Musculoskeletal and Integument   Onychomycosis   Noted during physical exam, she has onychomycosis of the left second toe. -Podiatrist referrals placed      Relevant Orders   Ambulatory referral to Podiatry     Other   Housing situation unstable - Primary   Relevant Orders   AMB Referral [facility] Care Management   Depressed mood   PHQ-9 score of 9, patient reports fatigue and less energy, reports that she feels it more physically.  Patient was counseled on SSRIs/SNRIs.  She was advised to start medications for her depression, however patient refused currently reports that she wants to continue therapy with be on-call.  Will consider the next office visit. -Continue therapy with beyond: - Consider starting SSRI/SNRI at the next office visit if patient continues to have depressed mood.      Breast cancer screening by mammogram   None per chart review, mammogram orders placed      Encounter for screening involving social determinants of health [facility])   Patient had trouble affording medications, does not have a job and financial support.  Patient appears to have unstable housing as well.  Per the last office visit, she was referred to integrated behavioral health for housing needs.  However patient reports that she spoke with be on-call, who focused on depression and anxiety. -Referral to [facility] care management placed for unstable housing and financial needs      Other Visit Diagnoses       Encounter for screening mammogram for malignant neoplasm of breast       Relevant Orders   MM Digital Screening       Return in about 1 month (around 01/13/2024) for 1 week with RN for BP check; 1 month with physician for HTN, DM, SDOH .    Toma Edwards, DO

## 2023-12-15 ENCOUNTER — Other Ambulatory Visit: Payer: Self-pay

## 2023-12-15 DIAGNOSIS — Z139 Encounter for screening, unspecified: Secondary | ICD-10-CM | POA: Insufficient documentation

## 2023-12-15 DIAGNOSIS — Z1231 Encounter for screening mammogram for malignant neoplasm of breast: Secondary | ICD-10-CM | POA: Insufficient documentation

## 2023-12-15 DIAGNOSIS — B351 Tinea unguium: Secondary | ICD-10-CM | POA: Insufficient documentation

## 2023-12-15 LAB — MICROALBUMIN / CREATININE URINE RATIO
Creatinine, Urine: 91.1 mg/dL
Microalb/Creat Ratio: 5 mg/g{creat} (ref 0–29)
Microalbumin, Urine: 4.7 ug/mL

## 2023-12-15 NOTE — Progress Notes (Signed)
 Internal Medicine Clinic Attending  Case discussed with the resident at the time of the visit.  We reviewed the resident's history and exam and pertinent patient test results.  I agree with the assessment, diagnosis, and plan of care documented in the resident's note.    Patient would benefit from insulin , so once she has more stable housing & refrigeration access, would recommend insulin 

## 2023-12-15 NOTE — Assessment & Plan Note (Signed)
 None per chart review, mammogram orders placed

## 2023-12-15 NOTE — Assessment & Plan Note (Signed)
 Patient had trouble affording medications, does not have a job and financial support.  Patient appears to have unstable housing as well.  Per the last office visit, she was referred to integrated behavioral health for housing needs.  However patient reports that she spoke with be on-call, who focused on depression and anxiety. -Referral to [facility] care management placed for unstable housing and financial needs

## 2023-12-15 NOTE — Assessment & Plan Note (Signed)
 PHQ-9 score of 9, patient reports fatigue and less energy, reports that she feels it more physically.  Patient was counseled on SSRIs/SNRIs.  She was advised to start medications for her depression, however patient refused currently reports that she wants to continue therapy with be on-call.  Will consider the next office visit. -Continue therapy with beyond: - Consider starting SSRI/SNRI at the next office visit if patient continues to have depressed mood.

## 2023-12-15 NOTE — Addendum Note (Signed)
 Addended by: Rashae Rother L on: 12/15/2023 09:03 AM   Modules accepted: Level of Service

## 2023-12-15 NOTE — Assessment & Plan Note (Signed)
 Noted during physical exam, she has onychomycosis of the left second toe. -Podiatrist referrals placed

## 2023-12-15 NOTE — Assessment & Plan Note (Signed)
 Lab Results  Component Value Date   HGBA1C >14.0 (A) 12/01/2023   Per the last episode, patient had trouble affording medications, she does not have a job and financial support.  She does not have any stable housing, to keep insulin  in her fridge.  Patient reports that she was previously on insulin , however Medicaid has stopped paying for it.  Presently she is taking metformin  1000 mg twice daily. -Continue metformin  1000 mg twice daily - Will check urine ACR, start Farxiga  10 mg daily -Referral to ophthalmology is placed -Foot exam in office by RN, pulses present

## 2023-12-15 NOTE — Assessment & Plan Note (Signed)
 BP Readings from Last 3 Encounters:  12/14/23 (!) 148/95  12/01/23 (!) 153/82  11/25/23 (!) 180/116   Per the last office visit, on 12/01/2023, patient had trouble with affording medications and financial situation.  She was started on lisinopril  20 mg daily, patient reports that she did not take her medication this morning, as she woke up at 12 PM.  Reports that she does not have a blood pressure cuff at the home, so she could check her blood pressures.  She denies any symptoms of dizziness, lightheadedness, blurry vision, chest pain or shortness of breath. -Advised to return back to clinic in 1 week for a RN blood pressure check -If blood pressure is elevated, plan to increase lisinopril  to the next highest dose will consider adding an additional agent (hydrochlorothiazide )

## 2023-12-16 ENCOUNTER — Ambulatory Visit: Payer: Self-pay | Admitting: Student

## 2023-12-16 ENCOUNTER — Telehealth: Payer: Self-pay | Admitting: *Deleted

## 2023-12-16 NOTE — Progress Notes (Signed)
 Complex Care Management Note Care Guide Note  12/16/2023 Name: Brooke Hughes MRN: [id number] DOB: 10/19/1970   Complex Care Management Outreach Attempts: An unsuccessful telephone outreach was attempted today to offer the patient information about available complex care management services.  Follow Up Plan:  Additional outreach attempts will be made to offer the patient complex care management information and services.   Encounter Outcome:  No Answer  [facility] Care Guide  Direct Dial:[phone] Fax:[phone] Website: [email or web address]

## 2023-12-17 ENCOUNTER — Telehealth: Payer: Self-pay | Admitting: *Deleted

## 2023-12-17 NOTE — Progress Notes (Signed)
 Complex Care Management Note  Care Guide Note 12/17/2023 Name: MARYLON VERNO MRN: [id number] DOB: 17-Jul-1970  Sue CHRISTELLA Hahn is a 53 y.o. year old female who sees Tobie Gaines, DO for primary care. I reached out to Sue CHRISTELLA Hahn by phone today to offer complex care management services.  Ms. Boulet was given information about Complex Care Management services today including:   The Complex Care Management services include support from the care team which includes your Nurse Care Manager, Clinical Social Worker, or Pharmacist.  The Complex Care Management team is here to help remove barriers to the health concerns and goals most important to you. Complex Care Management services are voluntary, and the patient may decline or stop services at any time by request to their care team member.   Complex Care Management Consent Status: Patient agreed to services and verbal consent obtained.   Follow up plan:  Telephone appointment with complex care management team member scheduled for:  7/15  Encounter Outcome:  Patient Scheduled  Harlene Satterfield  [facility]  [facility], [facility] Guide  Direct Dial: [phone]  Fax [phone]

## 2023-12-17 NOTE — Progress Notes (Signed)
 Complex Care Management Note Care Guide Note  12/17/2023 Name: JOEL MERICLE MRN: [id number] DOB: [birth year]  Sue CHRISTELLA Hahn is a 53 y.o. year old female who is a primary care patient of Tobie Gaines, DO . The community resource team was consulted for assistance with Food Insecurity  SDOH screenings and interventions completed:  Yes     SDOH Interventions Today    Flowsheet Row Most Recent Value  SDOH Interventions   Food Insecurity Interventions --  [Provided food banks]  Housing Interventions Community Resources Provided  [Needs access to money for housing has hearing upcoming for SSDI]     Care guide performed the following interventions: Patient provided with information about care guide support team and interviewed to confirm resource needs.  Follow Up Plan:  No further follow up planned at this time. The patient has been provided with needed resources.  Encounter Outcome: Completed   [facility] Care Guide  Direct [phone] Fax:[phone] Website: [email or web address]

## 2023-12-21 ENCOUNTER — Ambulatory Visit

## 2023-12-29 ENCOUNTER — Other Ambulatory Visit: Payer: Self-pay

## 2023-12-29 DIAGNOSIS — Z139 Encounter for screening, unspecified: Secondary | ICD-10-CM

## 2023-12-29 NOTE — Patient Outreach (Signed)
 Complex Care Management   Visit Note  12/29/2023  Name:  Brooke Hughes MRN: [id number] DOB: Jan 26, 1971  Situation: Referral received for Complex Care Management related to SDOH Barriers:  Housing homeless Food insecurity I obtained verbal consent from Patient.  Visit completed with Sue Hahn  on the phone  Background:   Past Medical History:  Diagnosis Date   Diabetes mellitus without complication (HCC)    Encounter for hepatitis C screening test for low risk patient 05/01/2022   Encounter for screening for HIV 05/01/2022   Hypertension    Obesity    Stroke (HCC) 08/31/2021    Assessment: Patient Reported Symptoms:  Cognitive Cognitive Status: Able to follow simple commands, Alert and oriented to person, place, and time, Normal speech and language skills Cognitive/Intellectual Conditions Management [RPT]: None reported or documented in medical history or problem list      Neurological Neurological Review of Symptoms: Numbness (Numbness in bilateral feet and legs) Neurological Management Strategies: Routine screening, Coping strategies  HEENT HEENT Symptoms Reported: Other: HEENT Comment: Referral placed by PCP 12/14/23 for diabetic eye evaluation. Patient has not heard to schedule.    Cardiovascular Cardiovascular Symptoms Reported: Swelling in legs or feet (Swelling in ankles and knees, L>R) Does patient have uncontrolled Hypertension?: Yes Is patient checking Blood Pressure at home?: No (Does not have BP cuff) Cardiovascular Management Strategies: Medication therapy, Adequate rest Cardiovascular Comment: Patient reports she has been going to mobile clinics when they are closeby to get her BP checked  Respiratory Respiratory Symptoms Reported: Shortness of breath (Shortness of breath with exertion, relieved with rest) Respiratory Management Strategies: Adequate rest, Coping strategies  Endocrine Endocrine Symptoms Reported: Unintentional weight loss, Blurry vision,  Weakness or fatigue, Increased urination (Symptoms come and go) Is patient diabetic?: Yes Is patient checking blood sugars at home?: Yes List most recent blood sugar readings, include date and time of day: When able due to not always being able to afford equipment. She tries to check her blood sugar every other day. Reports it is running 140-150 fasting.    Gastrointestinal Gastrointestinal Symptoms Reported: Unintentional weight loss Additional Gastrointestinal Details: Patient reports her appetite is fair. Last BM 2 days ago. Gastrointestinal Management Strategies: Coping strategies    Genitourinary Genitourinary Symptoms Reported: Frequency Genitourinary Management Strategies: Coping strategies  Integumentary Integumentary Symptoms Reported: No symptoms reported    Musculoskeletal Musculoskelatal Symptoms Reviewed: Weakness Musculoskeletal Management Strategies: Coping strategies, Routine screening Falls in the past year?: Yes Number of falls in past year: 2 or more Was there an injury with Fall?: No Fall Risk Category Calculator: 2 Patient Fall Risk Level: Moderate Fall Risk Patient at Risk for Falls Due to: History of fall(s), Impaired balance/gait, Other (Comment) (neuropathy) Fall risk Follow up: Falls evaluation completed, Education provided, Falls prevention discussed  Psychosocial Psychosocial Symptoms Reported: No symptoms reported Behavioral Management Strategies: Counseling Behavioral Health Comment: Patient is actively working with LCSW at PCP office for counseling   Quality of Family Relationships: non-existent Do you feel physically threatened by others?: No      12/29/2023    2:07 PM  Depression screen PHQ 2/9  Decreased Interest 1  Down, Depressed, Hopeless 0  PHQ - 2 Score 1    There were no vitals filed for this visit.  Medications Reviewed Today     Reviewed by Arno Rosaline SQUIBB, RN (Registered Nurse) on 12/29/23 at 1404  Med List Status: <None>    Medication Order Taking? Sig Documenting Provider Last Dose Status Informant  Blood Glucose Monitoring Suppl (ONETOUCH VERIO) w/Device KIT [id number] Yes 1 each by Does not apply route as needed. Jolaine Pac, DO  Active   dapagliflozin  propanediol (FARXIGA ) 10 MG TABS tablet [id number] Yes Take 1 tablet (10 mg total) by mouth daily. Tawkaliyar, Roya, DO  Active   glucose blood (ONETOUCH VERIO) test strip [id number] Yes 1 each by Other route 3 (three) times daily. Use as instructed Jolaine Pac, DO  Active   Lancets 30G MISC [id number] Yes 1 each by Does not apply route every 8 (eight) hours. Jolaine Pac, DO  Active   lisinopril  (ZESTRIL ) 20 MG tablet [id number] Yes Take 1 tablet (20 mg total) by mouth daily. Tobie Gaines, DO  Active   metFORMIN  (GLUCOPHAGE ) 500 MG tablet [id number]  Take 2 tablets (1,000 mg total) by mouth 2 (two) times daily with a meal. Tobie Gaines, DO  Active             Recommendation:   PCP Follow-up Specialty provider follow-up podiatry, cardiology Referral to: BSW, telephone appointment scheduled 12/31/23 at 11 AM Continue Current Plan of Care  Follow Up Plan:   Telephone follow up appointment date/time:  01/12/24 at 11 AM Referral to BSW  [name], RN MSN [place]  [facility] RN Care Manager Direct Dial: [phone]  Fax: [phone]

## 2023-12-29 NOTE — Patient Instructions (Signed)
 Visit Information  [name] was given information about Medicaid Managed Care team care coordination services as a part of their [facility] Community Plan Medicaid benefit. Brooke Hughes verbally consented to engagement with the [facility] Managed Care team.   If you are experiencing a medical emergency, please call 911 or report to your local emergency department or urgent care.   If you have a non-emergency medical problem during routine business hours, please contact your provider's office and ask to speak with a nurse.   For questions related to your [facility], please call: [phone] or visit the homepage here: [email or web address]  If you would like to schedule transportation through your [facility], please call the following number at least 2 days in advance of your appointment: [phone]   Rides for urgent appointments can also be made after hours by calling Member Services.  Call the Behavioral Health Crisis Line at [phone], at any time, 24 hours a day, 7 days a week. If you are in danger or need immediate medical attention call 911.  If you would like help to quit smoking, call 1-800-QUIT-NOW ((365) 026-1578) OR Espaol: 1-855-Djelo-Ya (8-144-664-6430) o para ms informacin haga clic aqu or Text READY to 799-599 to register via text  [name] - following are the goals we discussed in your visit today:   Goals Addressed             This Visit's Progress    VBCI RN Care Plan   On track    Problems:  Care Coordination needs related to Financial Strain , Food Insecurity , and Housing  Chronic Disease Management support and education needs related to HTN  Goal: Over the next 30 days the Patient will attend all scheduled medical appointments: mammogram, podiatry, cardiology, PCP as evidenced by completed visit notes uploaded to EMR        demonstrate  Improved health management independence as evidenced by monitoring BP through mobile clinics or free BP checks at grocery stores/drug stores        take all medications exactly as prescribed and will call provider for medication related questions as evidenced by patient report of medication compliance    work with Child psychotherapist to address Housing barriers and Limited access to food related to the management of HTN as evidenced by review of electronic medical record and patient or social worker report     work with PCP and pharmacy to obtain affordable medications as evidenced by review of electronic medical record and patient or care team member report    Interventions:   Evaluation of current treatment plan related to HTN, Limited access to food and Housing barriers self-management and patient's adherence to plan as established by provider. Discussed plans with patient for ongoing care management follow up and provided patient with direct contact information for care management team Evaluation of current treatment plan related to HTN and patient's adherence to plan as established by provider Advised patient to continue to work with LCSW at PCP office for mental health needs Reviewed medications with patient and discussed importance of medication compliance Provided patient and/or caregiver with verbal information about free BP checks in the community ([employer], [facility], mobile clinic) Nurse, learning disability) Reviewed scheduled/upcoming provider appointments including mammogram, podiatry, cardio, PCP Social Work referral for [employer] needs/homelessness Discussed plans with patient for ongoing care management follow up and provided patient with direct contact information for care management team Screening for signs and symptoms of depression related  to chronic disease state  Assessed social determinant of health barriers  Hypertension Interventions: Last practice recorded BP readings:  BP Readings  from Last 3 Encounters:  12/14/23 (!) 148/95  12/01/23 (!) 153/82  11/25/23 (!) 180/116   Most recent eGFR/CrCl:  Lab Results  Component Value Date   EGFR 71 12/01/2023    No components found for: CRCL  Evaluation of current treatment plan related to hypertension self management and patient's adherence to plan as established by provider Reviewed medications with patient and discussed importance of compliance Discussed plans with patient for ongoing care management follow up and provided patient with direct contact information for care management team Reviewed scheduled/upcoming provider appointments including:   Patient Self-Care Activities:  Attend all scheduled provider appointments Call provider office for new concerns or questions  Take medications as prescribed   Work with the social worker to address care coordination needs and will continue to work with the clinical team to address health care and disease management related needs choose a place to take my blood pressure (home, clinic or office, retail store) call doctor for signs and symptoms of high blood pressure keep all doctor appointments take medications for blood pressure exactly as prescribed report new symptoms to your doctor  Plan:  Telephone follow up appointment with care management team member scheduled for:  01/12/24 at 11 AM             Patient verbalizes understanding of instructions and care plan provided today and agrees to view in MyChart. Active MyChart status and patient understanding of how to access instructions and care plan via MyChart confirmed with patient.     Social Worker will call patient for scheduled initial visit 12/31/23 at 11 AM.  Telephone follow up appointment with Managed Medicaid care management team member scheduled for: 01/12/24 at 11 AM  Brooke Finlay, RN MSN [place]  [facility] RN Care Manager Direct Dial: [phone]  Fax:  [phone]   Following is a copy of your plan of care:  There are no care plans that you recently modified to display for this patient.

## 2023-12-30 ENCOUNTER — Encounter: Payer: Self-pay | Admitting: Podiatry

## 2023-12-30 ENCOUNTER — Ambulatory Visit (INDEPENDENT_AMBULATORY_CARE_PROVIDER_SITE_OTHER): Admitting: Podiatry

## 2023-12-30 ENCOUNTER — Ambulatory Visit
Admission: RE | Admit: 2023-12-30 | Discharge: 2023-12-30 | Disposition: A | Discharge status: Home / Self Care | Source: Ambulatory Visit | Attending: Internal Medicine | Admitting: Internal Medicine

## 2023-12-30 DIAGNOSIS — Z1231 Encounter for screening mammogram for malignant neoplasm of breast: Secondary | ICD-10-CM

## 2023-12-30 DIAGNOSIS — L600 Ingrowing nail: Secondary | ICD-10-CM

## 2023-12-31 ENCOUNTER — Ambulatory Visit: Admitting: Licensed Clinical Social Worker

## 2023-12-31 ENCOUNTER — Other Ambulatory Visit: Payer: Self-pay

## 2023-12-31 DIAGNOSIS — Z59819 Housing instability, housed unspecified: Secondary | ICD-10-CM | POA: Diagnosis not present

## 2023-12-31 DIAGNOSIS — R4589 Other symptoms and signs involving emotional state: Secondary | ICD-10-CM | POA: Diagnosis present

## 2023-12-31 NOTE — Patient Outreach (Signed)
 Complex Care Management   Visit Note  12/31/2023  Name:  ZANNA HAWN MRN: [id number] DOB: June 04, 1971  Situation: Referral received for Complex Care Management related to SDOH Barriers:  Housing homelessness Food insecurity I obtained verbal consent from Patient.  Visit completed with patient  on the phone  Background:   Past Medical History:  Diagnosis Date   Diabetes mellitus without complication (HCC)    Encounter for hepatitis C screening test for low risk patient 05/01/2022   Encounter for screening for HIV 05/01/2022   Hypertension    Obesity    Stroke (HCC) 08/31/2021    Assessment: BSW held initial call with patient. Patient was alert and cognitive. Patient reports she is currently homeless. Patient states she had an appt today with Renda, LCSW and per chart review, spent most of the time calling shelters in the area. Patient states she is open to whatever shelter can take her in including shelters outside of [place]. Patient states she lives out of her car right now and does not have any income. Patient states her family (children) have their own families and now that she has no income they don't really support her. Patient states she does not have food and struggles to pay for gas. Food resources will be provided to patient via email for quick access. No other resources provided at this time.  SDOH Interventions    Flowsheet Row Patient Outreach Telephone from 12/29/2023 in [place] POPULATION HEALTH DEPARTMENT Telephone from 12/17/2023 in [place] POPULATION HEALTH DEPARTMENT Community Documentation from 11/25/2023 in [facility] Office Visit from 03/23/2023 in [facility] - A Dept Of [place]. [facility] Office Visit from 09/04/2022 in [facility] - A Dept Of [place]. [facility] Office Visit from 05/22/2022 in [facility] - A Dept Of [place]. [facility]  SDOH  Interventions        Food Insecurity Interventions AMB Referral --  [Provided food banks] Community Resources Provided -- -- --  Housing Interventions AMB Referral Community Resources Provided  [employer] access to money for housing has hearing upcoming for [employer] Provided -- -- AMB Referral  Transportation Interventions AMB Referral -- -- -- Intervention Not Indicated --  Utilities Interventions AMB Referral -- -- -- -- --  Depression Interventions/Treatment  -- -- -- Counseling -- --    Recommendation:   Attempt to access food resources/shelters.   Follow Up Plan:   Telephone follow up appointment date/time:  01/14/2024 at 11AM  Laymon Doll, [place]/VBCI - [facility] Social Worker [phone]

## 2023-12-31 NOTE — BH Specialist Note (Signed)
 Integrated Behavioral Health Follow Up In-Person Visit  MRN: [id number] Name: Brooke Hughes  Number of Integrated Behavioral Health Clinician visits: Additional Visit  Session Start time: 0845   Session End time: 1015  Total time in minutes: 90    Types of Service: General Behavioral Integrated Care (BHI)  Interpretor:No. Interpretor Name and Language: N/A  Subjective: Brooke Hughes is a 53 y.o. female   Patient was referred by PCP for Depression/ Anxiety.  Patient reports the following symptoms/concerns:   LCSW-A conducted an in-person session with the client on today. Client appeared flat in affect but remained calm, open, and engaging throughout the session. Client was noticeably disheveled and shared that she is currently living out of her car. She has three adult children  two sons and one daughter. The sons are unable to provide housing support at this time, and while her daughter's home is full, it may be a last-resort option. However, the client expressed that staying there would likely have a negative impact on her mental health, and she would prefer to avoid that if possible.  Client is currently on the housing waiting list and reported being on the list for approximately seven years. LCSW-A assisted with updating the client's housing application, helped reset her password, and provided education on how to use the housing portal. Although the client has no current income, her application inaccurately showed $12,000, which LCSW-A noted for correction.  Client has an upcoming disability hearing scheduled for 08/26 and is working with an Pensions consultant. She expressed hopefulness about the potential outcome of the hearing.  The remainder of the session was spent contacting multiple women's shelters on behalf of the client. Most calls went to voicemail. LCSW-A confirmed that [employer] and [employer] are currently full. Client is open to shelter options  outside of [place] and [place]; however, outreach to agencies in [place] also yielded no immediate availability.  Before concluding the session, LCSW-A provided the client with food from the office pantry and reviewed coping strategies to help her stay grounded and maintain hope during this difficult time.   Client verbalized understanding that she is scheduled to receive a call from VBCI at 11 a.m. LCSW-A plans to follow up with the client within the next 30 days.  Plan:  Individual therapy  Follow-up scheduled within 30 days  Monitor outcome of disability hearing  Encourage client to utilize coping strategies and maintain connection with supports Duration of problem: More than 12 months; Severity of problem: moderate  Objective: Mood: Hopeless and Affect: Depressed Risk of harm to self or others: No plan to harm self or others  Life Context: Family and Social: Patient has an adult daughter and two sons. School/Work: Unemployed due to stroke Self-Care: Not discussed Life Changes: Currently Homeless and living out of her car  Patient and/or Family's Strengths/Protective Factors: Sense of purpose  Goals Addressed: Patient will:  Reduce symptoms of: depression   Increase knowledge and/or ability of: coping skills   Demonstrate ability to: Increase healthy adjustment to current life circumstances  Progress towards Goals: Ongoing  Interventions: Interventions utilized:  Motivational Interviewing, CBT Cognitive Behavioral Therapy, and Link to [employer] Standardized Assessments completed: Patient declined screening      Patient and/or Family Response: Patient agrees to planGoal 1: Improve Housing Stability Client will work toward obtaining safe and stable housing within the next 6 months.  Objectives:  Client will remain active on local housing authority waiting list and maintain updated application.  Client will engage in follow-up with shelters or  transitional housing programs weekly with support from LCSW-A.  Client will explore potential safe, temporary housing options with family (as emotionally appropriate).  Interventions:  LCSW-A will assist client in navigating housing portals, updating applications, and providing referrals.  LCSW-A will contact shelters on client's behalf and provide lists of agencies and follow-up steps.  LCSW-A will continue providing food pantry access and resources as available.  Goal 2: Strengthen Coping and Emotional Resilience Client will develop and use healthy coping strategies to manage emotional stressors associated with homelessness and uncertainty.  Objectives:  Client will identify 3 coping skills to use when feeling overwhelmed, such as deep breathing, grounding techniques, or journaling.  Client will attend follow-up counseling sessions as scheduled.  Client will verbalize signs of emotional distress and communicate when support is needed.  Interventions:  LCSW-A will provide psychoeducation on anxiety management and coping skills.  LCSW-A will conduct regular check-ins to monitor client's emotional well-being.  LCSW-A will encourage use of strengths (e.g., resilience, hopefulness) in navigating life stressors.  Goal 3: Support Income and Customer service manager Client will pursue access to disability income and explore additional benefits over the next 3 months.  Objectives:  Client will attend disability hearing on 08/26 and remain in contact with her attorney.  Client will discuss financial needs and explore public benefits eligibility.  Client will maintain up-to-date income information on all applications.  Interventions:  LCSW-A will monitor progress with disability claim and assist client in preparing if needed.  LCSW-A will provide referrals for benefit screening and community financial assistance programs.  LCSW-A will support client in addressing any discrepancies  in reported income on applications.  Review Date: 30-day follow-up scheduled Client Participation: Client actively participated in the creation of this plan and verbalized understanding.    Clinical Assessment/Diagnosis  Depressed mood  Housing situation unstable    Assessment: Patient currently experiencing Housing insecurity, depression.   Patient may benefit from Ongoing Outpatient services.  Plan: Follow up with behavioral health clinician on : Within 30 days; Follow up with VBCI for case management  [name], MSW, LCSW-A She/Her Behavioral Health Clinician [facility]  Internal Medicine Center

## 2023-12-31 NOTE — Progress Notes (Signed)
 Subjective:   Patient ID: Brooke Hughes, female   DOB: 53 y.o.   MRN: [id number]   HPI Patient presents with ingrown toenail deformity of the left second nail that is hard for her to cut and get sore with obesity is complicating factor.  States it is thickened and dystrophic with discoloration other nails also can do this but she probably traumatized this 1.  Patient does not smoke likes to be active   Review of Systems  All other systems reviewed and are negative.       Objective:  Physical Exam Vitals and nursing note reviewed.  Constitutional:      Appearance: She is well-developed.  Pulmonary:     Effort: Pulmonary effort is normal.  Musculoskeletal:        General: Normal range of motion.  Skin:    General: Skin is warm.  Neurological:     Mental Status: She is alert.     Neurovascular status intact muscle strength was found to be adequate range of motion adequate with moderate obese female with history of cerebrovascular accident and does have a thickened dystrophic second nail left moderately tender with several other nails be discolored.  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  Patient will be seen back as needed and may require a more aggressive procedure again this was discussed and recovery

## 2023-12-31 NOTE — Patient Instructions (Signed)
 Visit Information  Ms. Voigt was given information about Medicaid Managed Care team care coordination services as a part of their [facility] Community Plan Medicaid benefit. [name] verbally consented to engagement with the [facility] Managed Care team.   If you are experiencing a medical emergency, please call 911 or report to your local emergency department or urgent care.   If you have a non-emergency medical problem during routine business hours, please contact your provider's office and ask to speak with a nurse.   For questions related to your [facility], please call: [phone] or visit the homepage here: [email or web address]  If you would like to schedule transportation through your [facility], please call the following number at least 2 days in advance of your appointment: [phone]   Rides for urgent appointments can also be made after hours by calling Member Services.  Call the Behavioral Health Crisis Line at [phone], at any time, 24 hours a day, 7 days a week. If you are in danger or need immediate medical attention call 911.  If you would like help to quit smoking, call 1-800-QUIT-NOW (365 326 4364) OR Espaol: 1-855-Djelo-Ya (8-144-664-6430) o para ms informacin haga clic aqu or Text READY to 799-599 to register via text  Ms. Slabaugh - following are the goals we discussed in your visit today:   Goals Addressed             This Visit's Progress    BSW VBCI Social Work Care Plan   On track    Problems:   Food Insecurity  and homelessness  CSW Clinical Goal(s):   Over the next 2 weeks the Patient will work with Child psychotherapist to address concerns related to food insecurity and homelessness..  Interventions:  BSW will attempt to refer patient to shelters.   Patient Goals/Self-Care Activities:  Attempt to access food resources.    Plan:   Telephone follow up appointment with care management team member scheduled for:  01/14/2024 at 11AM         Patient verbalizes understanding of instructions and care plan provided today and agrees to view in MyChart. Active MyChart status and patient understanding of how to access instructions and care plan via MyChart confirmed with patient.     Telephone follow up appointment with Managed Medicaid care management team member scheduled for: 01/14/2024 at 11AM  Laymon Doll, [place]/VBCI - [facility] Social Worker [phone]   Following is a copy of your plan of care:  There are no care plans that you recently modified to display for this patient.

## 2024-01-05 ENCOUNTER — Other Ambulatory Visit: Payer: Self-pay | Admitting: Internal Medicine

## 2024-01-05 DIAGNOSIS — R928 Other abnormal and inconclusive findings on diagnostic imaging of breast: Secondary | ICD-10-CM

## 2024-01-06 NOTE — Patient Outreach (Signed)
 BSW reached out to patient to inform her that he spoke with [facility] at [facility]. They confirmed a female bed is currently available and it is on a first-come-first-serve basis. Patient was provided [facility] and [facility] and encouraged to be there tomorrow morning for intake no later than 7:30AM. Patient understood and agreed

## 2024-01-12 ENCOUNTER — Other Ambulatory Visit: Payer: Self-pay

## 2024-01-12 NOTE — Patient Instructions (Signed)
 Visit Information  Ms. Strausser was given information about Medicaid Managed Care team care coordination services as a part of their [facility] Community Plan Medicaid benefit. Brooke Hughes verbally consented to engagement with the [facility] Managed Care team.   If you are experiencing a medical emergency, please call 911 or report to your local emergency department or urgent care.   If you have a non-emergency medical problem during routine business hours, please contact your provider's office and ask to speak with a nurse.   For questions related to your [facility], please call: [phone] or visit the homepage here: [email or web address]  If you would like to schedule transportation through your [facility], please call the following number at least 2 days in advance of your appointment: [phone]   Rides for urgent appointments can also be made after hours by calling Member Services.  Call the Behavioral Health Crisis Line at [phone], at any time, 24 hours a day, 7 days a week. If you are in danger or need immediate medical attention call 911.  If you would like help to quit smoking, call 1-800-QUIT-NOW (703-515-1774) OR Espaol: 1-855-Djelo-Ya (8-144-664-6430) o para ms informacin haga clic aqu or Text READY to 799-599 to register via text  Brooke Hughes - following are the goals we discussed in your visit today:   Goals Addressed             This Visit's Progress    VBCI RN Care Plan   On track    Problems:  Care Coordination needs related to Financial Strain , Food Insecurity , and Housing  Chronic Disease Management support and education needs related to HTN  Goal: Over the next 30 days the Patient will attend all scheduled medical appointments: mammogram, podiatry as evidenced by completed visit notes uploaded to EMR        demonstrate Improved health  management independence as evidenced by monitoring BP through mobile clinics or free BP checks at grocery stores/drug stores        take all medications exactly as prescribed and will call provider for medication related questions as evidenced by patient report of medication compliance    work with Child psychotherapist to address Housing barriers and Limited access to food related to the management of HTN as evidenced by review of electronic medical record and patient or social worker report     work with PCP and pharmacy to obtain affordable medications as evidenced by review of electronic medical record and patient or care team member report    Interventions:   Evaluation of current treatment plan related to HTN, Limited access to food and Housing barriers self-management and patient's adherence to plan as established by provider. Discussed plans with patient for ongoing care management follow up and provided patient with direct contact information for care management team Briefly discussed patient's current housing situation and plan. Unable to perform additional interventions as call was disconnected. Patient did not answer return call x2.  Hypertension Interventions: Last practice recorded BP readings:  BP Readings from Last 3 Encounters:  12/14/23 (!) 148/95  12/01/23 (!) 153/82  11/25/23 (!) 180/116   Most recent eGFR/CrCl:  Lab Results  Component Value Date   EGFR 71 12/01/2023    No components found for: CRCL   Patient Self-Care Activities:  Attend all scheduled provider appointments Call provider office for new concerns or questions  Take medications as prescribed   Work with the social worker to  address care coordination needs and will continue to work with the clinical team to address health care and disease management related needs choose a place to take my blood pressure (home, clinic or office, retail store) call doctor for signs and symptoms of high blood pressure keep all  doctor appointments take medications for blood pressure exactly as prescribed report new symptoms to your doctor  Plan:  The care management team will reach out to the patient again over the next 7 days to complete follow-up visit.             Unable to complete assessment due to call being disconnected. Will continue with attempts to reach patient to provide education and review care plan.  The Managed Medicaid care management team will reach out to the patient again over the next 7 days to complete follow-up visit.  [name], RN MSN [place]  VBCI Population Health RN Care Manager Direct Dial: [phone]  Fax: [phone]   Following is a copy of your plan of care:  There are no care plans that you recently modified to display for this patient.

## 2024-01-12 NOTE — Patient Outreach (Signed)
 Complex Care Management   Visit Note  01/12/2024  Name:  Brooke Hughes MRN: [id number] DOB: [birth year]  Situation: Referral received for Complex Care Management related to SDOH Barriers:  Housing   Food insecurity I obtained verbal consent from Patient.  Visit completed with Zaira Vora  on the phone. Unable to complete assessment as call was disconnected. Patient did not answer return call x2.  Background:   Past Medical History:  Diagnosis Date   Diabetes mellitus without complication (HCC)    Encounter for hepatitis C screening test for low risk patient 05/01/2022   Encounter for screening for HIV 05/01/2022   Hypertension    Obesity    Stroke (HCC) 08/31/2021    Assessment: Patient Reported Symptoms:  Cognitive Cognitive Status: Able to follow simple commands, Alert and oriented to person, place, and time, Normal speech and language skills Cognitive/Intellectual Conditions Management [RPT]: None reported or documented in medical history or problem list      Neurological Neurological Review of Symptoms: Not assessed    HEENT HEENT Symptoms Reported: Not assessed      Cardiovascular Cardiovascular Symptoms Reported: Not assessed Cardiovascular Management Strategies: Medication therapy, Adequate rest Cardiovascular Comment: Patient had appointment with cardiology since previous CMRN visit. Per chart review, a 30-day monitor and stress test were ordered.  Respiratory Respiratory Symptoms Reported: Not assesed    Endocrine Endocrine Symptoms Reported: Not assessed    Gastrointestinal Gastrointestinal Symptoms Reported: Not assessed      Genitourinary Genitourinary Symptoms Reported: Not assessed    Integumentary Integumentary Symptoms Reported: Not assessed    Musculoskeletal Musculoskelatal Symptoms Reviewed: Not assessed        Psychosocial Psychosocial Symptoms Reported: Not assessed            12/29/2023    2:07 PM  Depression screen PHQ 2/9   Decreased Interest 1  Down, Depressed, Hopeless 0  PHQ - 2 Score 1    There were no vitals filed for this visit.  Medications Reviewed Today     Reviewed by Arno Rosaline SQUIBB, RN (Registered Nurse) on 01/12/24 at 1105  Med List Status: <None>   Medication Order Taking? Sig Documenting Provider Last Dose Status Informant  Blood Glucose Monitoring Suppl (ONETOUCH VERIO) w/Device KIT [id number]  1 each by Does not apply route as needed. Jolaine Pac, DO  Active   dapagliflozin  propanediol (FARXIGA ) 10 MG TABS tablet [id number]  Take 1 tablet (10 mg total) by mouth daily. Tawkaliyar, Roya, DO  Active   glucose blood (ONETOUCH VERIO) test strip [id number]  1 each by Other route 3 (three) times daily. Use as instructed Jolaine Pac, DO  Active   Lancets 30G MISC [id number]  1 each by Does not apply route every 8 (eight) hours. Jolaine Pac, DO  Active   lisinopril  (ZESTRIL ) 20 MG tablet [id number]  Take 1 tablet (20 mg total) by mouth daily. Tobie Gaines, DO  Active   metFORMIN  (GLUCOPHAGE ) 500 MG tablet [id number]  Take 2 tablets (1,000 mg total) by mouth 2 (two) times daily with a meal. Tobie Gaines, DO  Active             Recommendation:   Continue Current Plan of Care CMRN will make additional attempts to complete follow-up visit and make additional recommendations.  Follow Up Plan:   Telephone follow-up within 7 days to complete follow-up visiti. Briefly discussed patient's current housing situation and plan. Unable to perform additional interventions as call was disconnected. Patient did not answer return call  x2.  [name], RN MSN [place]  [facility] Health RN Care Manager Direct Dial: [phone]  Fax: [phone]

## 2024-01-13 ENCOUNTER — Encounter: Admitting: Student

## 2024-01-13 ENCOUNTER — Encounter: Payer: Self-pay | Admitting: Student

## 2024-01-13 ENCOUNTER — Encounter

## 2024-01-13 ENCOUNTER — Other Ambulatory Visit

## 2024-01-13 NOTE — Assessment & Plan Note (Deleted)
 Uncontrolled type 2 diabetes, A1c >14.  She endorses polyuria polydipsia.  She has unstable housing status, has now switched to giving insulin  daily.  Per chart review, she has been noncompliant with her metformin  and Farxiga  as well. Fasting blood glucose around 250-300.   - not taking the metformin  and Farxiga  as prescribed  Plan: -Basaglar  to 32 units twice daily dosing. -Continue metformin  1000 mg twice daily -Started Farxiga  5 mg?  High risk of UTI and elevated A1c. -Follow-up in 1 month.

## 2024-01-13 NOTE — Assessment & Plan Note (Deleted)
She endorses depressed mood as a result of her living situation with her daughter.  PHQ-9 score today of 19. She is still working on disability benefits, and also getting her own place.   I encourage patient to follow-up on her disability benefits, and let us know if there is anything we can do to help her.  Denies active SI or HI. -Follow-up at next office visit. -Referral to behavioral health placed.

## 2024-01-13 NOTE — Progress Notes (Deleted)
   CC: Follow-up on hypertension and diabetes.  HPI:  Brooke Hughes is a 53 y.o. female living with a history stated below and presents today for  follow-up on hypertension and diabetes..   Patient was [facility] in [facility] on ***   Please see problem based assessment and plan for additional details.  Past Medical History:  Diagnosis Date   Diabetes mellitus without complication (HCC)    Encounter for hepatitis C screening test for low risk patient 05/01/2022   Encounter for screening for HIV 05/01/2022   Hypertension    Obesity    Stroke (HCC) 08/31/2021    Current Outpatient Medications on File Prior to Visit  Medication Sig Dispense Refill   Blood Glucose Monitoring Suppl (ONETOUCH VERIO) w/Device KIT 1 each by Does not apply route as needed. 1 kit 0   dapagliflozin  propanediol (FARXIGA ) 10 MG TABS tablet Take 1 tablet (10 mg total) by mouth daily. 90 tablet 3   glucose blood (ONETOUCH VERIO) test strip 1 each by Other route 3 (three) times daily. Use as instructed 100 each 12   Lancets 30G MISC 1 each by Does not apply route every 8 (eight) hours. 100 each 11   lisinopril  (ZESTRIL ) 20 MG tablet Take 1 tablet (20 mg total) by mouth daily. 30 tablet 11   metFORMIN  (GLUCOPHAGE ) 500 MG tablet Take 2 tablets (1,000 mg total) by mouth 2 (two) times daily with a meal. 120 tablet 11   No current facility-administered medications on file prior to visit.    Review of Systems: ROS negative except for what is noted on the assessment and plan.  There were no vitals filed for this visit. {Labs (Optional):23779} {Vitals History (Optional):23777}  Physical Exam: Constitutional: NAD Cardiovascular: RRR, no murmurs. Pulmonary/Chest: Clear bilateral lungs Abdominal: soft, non-tender, non-distended.  Assessment & Plan:   Patient {GC/GE:3044014::discussed with,seen with} Dr. {WJFZD:[id number]::Tpoopjfd,Z. Hoffman,Chambliss, Winfrey,Lau,Machen}  Assessment &  Plan Depressed mood She endorses depressed mood as a result of her living situation with her daughter.  PHQ-9 score today of 19. She is still working on disability benefits, and also getting her own place.   I encourage patient to follow-up on her disability benefits, and let us  know if there is anything we can do to help her.  Denies active SI or HI. -Follow-up at next office visit. -Referral to behavioral health placed. Type 2 diabetes mellitus with hyperglycemia, without long-term current use of insulin  (HCC) Uncontrolled type 2 diabetes, A1c >14.  She endorses polyuria polydipsia.  She has unstable housing status, has now switched to giving insulin  daily.  Per chart review, she has been noncompliant with her metformin  and Farxiga  as well. Fasting blood glucose around 250-300.   - not taking the metformin  and Farxiga  as prescribed  Plan: -Basaglar  to 32 units twice daily dosing. -Continue metformin  1000 mg twice daily -Started Farxiga  5 mg?  High risk of UTI and elevated A1c. -Follow-up in 1 month. Breast cancer screening by mammogram   No orders of the defined types were placed in this encounter.   Brooke Sandhoff, MD [facility] Internal Medicine, PGY-2  Date 01/13/2024 Time 12:01 PM

## 2024-01-14 ENCOUNTER — Other Ambulatory Visit: Payer: Self-pay

## 2024-01-14 NOTE — Patient Outreach (Signed)
 Care Coordination   01/14/2024 Name: Brooke Hughes MRN: [id number] DOB: [birth year]   Care Coordination Outreach Attempts:  An unsuccessful outreach was attempted to complete CCM follow-up visit.  Follow Up Plan:  Additional outreach attempts will be made to complete follow-up visit.   Encounter Outcome:  No Answer. HIPAA compliant voicemail left asking for call back.   [name], RN MSN [place]  VBCI Population Health RN Care Manager Direct Dial: [phone]  Fax: [phone]

## 2024-01-14 NOTE — Patient Outreach (Signed)
 BSW completed referral form with patient for coordinated entry contact referral form and sent it to [employer] ([email or web address]). Patient was advised that [facility] will be reaching out to her once form is received , inform her how the Coordinated Entry program works, next steps for enrollment, and provide her with shelter information for immediate housing needs. Patient agreed and understood.   Patient was also provided with resources for community hot meals and shelter information by BSW.

## 2024-01-15 NOTE — Patient Outreach (Signed)
 Care Coordination   01/15/2024 Name: Brooke Hughes MRN: [id number] DOB: 1970-09-28   Care Coordination Outreach Attempts:  A second unsuccessful outreach attempt was made today to complete CCM follow-up visit.  Follow Up Plan:  Additional outreach attempts will be made to complete follow-up visit.   Encounter Outcome:  No Answer. HIPAA compliant voicemail left asking for return call.   [name], RN MSN [place]  VBCI Population Health RN Care Manager Direct Dial: [phone]  Fax: [phone]

## 2024-01-18 NOTE — Patient Outreach (Signed)
 Care Coordination   01/18/2024 Name: Brooke Hughes MRN: [id number] DOB: August 30, 1970   Care Coordination Outreach Attempts:  A third unsuccessful outreach was attempted today to complete CCM follow-up visit.  Follow Up Plan:  No further outreach attempts will be made at this time. We have been unable to contact the patient to complete follow-up visit.  Encounter Outcome:  No Answer. HIPAA compliant voicemail left requesting call back.   [name], RN MSN [place]  VBCI Population Health RN Care Manager Direct Dial: [phone]  Fax: [phone]

## 2024-01-21 ENCOUNTER — Other Ambulatory Visit: Payer: Self-pay

## 2024-01-21 NOTE — Patient Outreach (Signed)
 Complex Care Management   Visit Note  01/21/2024  Name:  Brooke Hughes MRN: [id number] DOB: [birth year]  Situation: Referral received for Complex Care Management related to SDOH Barriers:  Transportation Housing homelessness Food insecurity Financial Resource Strain I obtained verbal consent from Patient.  Visit completed with patient  on the phone  Background:   Past Medical History:  Diagnosis Date   Diabetes mellitus without complication (HCC)    Encounter for hepatitis C screening test for low risk patient 05/01/2022   Encounter for screening for HIV 05/01/2022   Hypertension    Obesity    Stroke (HCC) 08/31/2021    Assessment: BSW held f/u appt with patient. Patient was alert and cognitive. Patient reports she is now living with her daughter. Patient states she refused to stay at the [facility] shelter and is living with daughter for now. BSW provided food resources to patient via email along with housing resources, and financial assistance opportunity through [facility]. [employer]. Patient was encouraged to call for assistance on 08/11 at [facility] for the household. No other resources were provided/requested at this time.   SDOH Interventions    Flowsheet Row Patient Outreach Telephone from 12/29/2023 in [place] POPULATION HEALTH DEPARTMENT Telephone from 12/17/2023 in [place] POPULATION HEALTH DEPARTMENT Community Documentation from 11/25/2023 in [facility] Office Visit from 03/23/2023 in [facility] - A Dept Of [place]. [facility] Office Visit from 09/04/2022 in [facility] - A Dept Of [place]. [facility] Office Visit from 05/22/2022 in [facility] - A Dept Of [place]. [facility]  SDOH Interventions        Food Insecurity Interventions AMB Referral --  [Provided food banks] Community Resources Provided -- -- --  Housing Interventions AMB Referral Community  Resources Provided  [employer] access to money for housing has hearing upcoming for [employer] Provided -- -- AMB Referral  Transportation Interventions AMB Referral -- -- -- Intervention Not Indicated --  Utilities Interventions AMB Referral -- -- -- -- --  Depression Interventions/Treatment  -- -- -- Counseling -- --      Recommendation:   Access shared resources as able to.   Follow Up Plan:   Telephone follow up appointment date/time:  02/04/2024 at 11:30AM.   Brooke Hughes, BSW [place]/VBCI - [facility] Social Worker [phone]

## 2024-01-21 NOTE — Patient Instructions (Signed)
 Visit Information  Brooke Hughes was given information about Medicaid Managed Care team care coordination services as a part of their [facility] Community Plan Medicaid benefit.   If you would like to schedule transportation through your [facility], please call the following number at least 2 days in advance of your appointment: [phone]   Rides for urgent appointments can also be made after hours by calling Member Services.  Call the Behavioral Health Crisis Line at [phone], at any time, 24 hours a day, 7 days a week. If you are in danger or need immediate medical attention call 911.   Brooke Hughes - following are the goals we discussed in your visit today:   Goals Addressed             This Visit's Progress    BSW VBCI Social Work Care Plan   On track    Problems:   Food Insecurity  and homelessness  CSW Clinical Goal(s):   Over the next 2 weeks the Patient will work with Child psychotherapist to address concerns related to food insecurity and homelessness..  Interventions:  BSW will attempt to refer patient to shelters.   Patient Goals/Self-Care Activities:  Attempt to access food resources.   Plan:   Telephone follow up appointment with care management team member scheduled for:  01/14/2024 at 11AM         Patient verbalizes understanding of instructions and care plan provided today and agrees to view in MyChart. Active MyChart status and patient understanding of how to access instructions and care plan via MyChart confirmed with patient.     Telephone follow up appointment with Managed Medicaid care management team member scheduled for: 02/04/2024 at 11:30AM  Laymon Doll, [place]/VBCI - [facility] Social Worker [phone]   Following is a copy of your plan of care:  There are no care plans that you recently modified to display for this patient.

## 2024-01-26 ENCOUNTER — Other Ambulatory Visit: Payer: Self-pay | Admitting: Internal Medicine

## 2024-01-26 ENCOUNTER — Ambulatory Visit
Admission: RE | Admit: 2024-01-26 | Discharge: 2024-01-26 | Disposition: A | Discharge status: Home / Self Care | Source: Ambulatory Visit | Attending: Internal Medicine | Admitting: Internal Medicine

## 2024-01-26 DIAGNOSIS — N6489 Other specified disorders of breast: Secondary | ICD-10-CM

## 2024-01-26 DIAGNOSIS — R928 Other abnormal and inconclusive findings on diagnostic imaging of breast: Secondary | ICD-10-CM

## 2024-02-04 ENCOUNTER — Other Ambulatory Visit: Payer: Self-pay

## 2024-02-04 NOTE — Patient Instructions (Signed)
 Visit Information  [name] was given information about Medicaid Managed Care team care coordination services as a part of their [facility] Community Plan Medicaid benefit.   If you would like to schedule transportation through your [facility], please call the following number at least 2 days in advance of your appointment: [phone]   Rides for urgent appointments can also be made after hours by calling Member Services.  Call the Behavioral Health Crisis Line at [phone], at any time, 24 hours a day, 7 days a week. If you are in danger or need immediate medical attention call 911.   [name] - following are the goals we discussed in your visit today:   Goals Addressed             This Visit's Progress    COMPLETED: BSW VBCI Social Work Care Plan   On track    Problems:   Food Insecurity  and homelessness  CSW Clinical Goal(s):   Over the next 2 weeks the Patient will work with Child psychotherapist to address concerns related to food insecurity and homelessness..  Interventions:  BSW will attempt to refer patient to shelters.   Patient Goals/Self-Care Activities:  Attempt to access food resources.   Plan:   Telephone follow up appointment with care management team member scheduled for:  01/14/2024 at 11AM        Please see education materials related to transportation and housing resources provided via email.  Patient verbalizes understanding of instructions and care plan provided today and agrees to view in MyChart. Active MyChart status and patient understanding of how to access instructions and care plan via MyChart confirmed with patient.     No further follow up is required at this time.   Laymon Doll, BSW [place]/VBCI - [employer] Social Worker [phone]   Following is a copy of your plan of care:  There are no care plans that you recently modified to display for this patient.

## 2024-02-04 NOTE — Patient Outreach (Signed)
 Complex Care Management   Visit Note  02/04/2024  Name:  AVIELA BLUNDELL MRN: [id number] DOB: November 17, 1970  Situation: Referral received for Complex Care Management related to SDOH Barriers:  Transportation Housing finding housing Food insecurity I obtained verbal consent from Patient.  Visit completed with Patient  on the phone  Background:   Past Medical History:  Diagnosis Date   Diabetes mellitus without complication (HCC)    Encounter for hepatitis C screening test for low risk patient 05/01/2022   Encounter for screening for HIV 05/01/2022   Hypertension    Obesity    Stroke (HCC) 08/31/2021    Assessment: BSW held f/u call with pt. Pt reports still living with daughter and is no longer homeless on the street. Pt reports she is doing ok with food and appreciates the resources that have been provided to her. Pt is waiting for upcoming disability hearing scheduled for next week. Pt states at this time she does not need additional resources and is ok with closing case out for BSW services. BSW provided via email a list of affordable housing vacancies for her reference whenever she began to look for housing. Pt was also provided instructions on how to schedule transportation for medicaid covered appointments. No other resources were provided/requested. Pt was informed on how to get connected to BSW services again in the future. Patient understood and agreed.   SDOH Interventions    Flowsheet Row Patient Outreach Telephone from 12/29/2023 in [facility] DEPARTMENT Telephone from 12/17/2023 in [facility] POPULATION HEALTH DEPARTMENT Community Documentation from 11/25/2023 in [facility] Office Visit from 03/23/2023 in [facility] - A Dept Of [place]. [facility] Office Visit from 09/04/2022 in [facility] - A Dept Of [place]. [facility] Office Visit from 05/22/2022 in [facility] - A Dept  Of [place]. [facility]  SDOH Interventions        Food Insecurity Interventions AMB Referral --  [Provided food banks] Community Resources Provided -- -- --  Housing Interventions AMB Referral Community Resources Provided  [employer] access to money for housing has hearing upcoming for [employer] Provided -- -- AMB Referral  Transportation Interventions AMB Referral -- -- -- Intervention Not Indicated --  Utilities Interventions AMB Referral -- -- -- -- --  Depression Interventions/Treatment  -- -- -- Counseling -- --    Recommendation:   none  Follow Up Plan:   Patient has met all care management goals. Care Management case will be closed. Patient has been provided contact information should new needs arise.   Laymon Doll, BSW [place] [employer] Social Worker [phone]

## 2024-02-10 ENCOUNTER — Telehealth: Payer: Self-pay | Admitting: Student

## 2024-02-10 LAB — HEMOGLOBIN A1C: Hemoglobin A1C: 12

## 2024-02-10 NOTE — Telephone Encounter (Signed)
 Message forwarded to [facility] to f/u with the patient's request.  Copied from CRM #[id number]. Topic: General - Other >> Feb 10, 2024 12:04 PM Cherylann RAMAN wrote: Reason for CRM: Rumalda Shuck with [employer] calling regarding a letter for the patient. She will be sending over a letter requesting her PCP to report on patient's ability to work. Rumalda Shuck can be contacted at [phone]

## 2024-02-11 NOTE — Telephone Encounter (Signed)
 I did received a letter from [facility]. I placed the letter request in the red team box under miscellaneous.

## 2024-02-25 NOTE — Progress Notes (Signed)
 [facility] came to [facility] 11/25/23. BP highly elevated 180/108<180/116. Non fasting glucose 367. Patient was referred to mobile clinic immediately. Pt indicated some SDOH needs. Some community resources given.   At the event the patient noted she has insurance, and a pcp.  PT noted she has a housing and food insecurity. Per chart review pt does have a pcp and is consistent with care. The last office visit with the pcp was 12/01/23 for type 2 diabetes. The pcp is aware of bp levels and sugar levels. Chart review indicates a future pcp appt on 03/10/24.  CHW spoke with pt and she states she no longer needs housing. PT states she is working with [employer] on the last steps for a voucher. Pt states she will take [place] food resources preferred via email. Email address was confirmed and food resources were sent, along with [place] bp, weight management, sugar, and healthy eating resources.  No additional Health equity team support indicated at this time.

## 2024-02-26 ENCOUNTER — Other Ambulatory Visit: Payer: Self-pay | Admitting: Student

## 2024-02-26 NOTE — Progress Notes (Signed)
 Encounter made for letter.

## 2024-03-09 ENCOUNTER — Ambulatory Visit: Payer: Self-pay

## 2024-03-09 NOTE — Telephone Encounter (Signed)
 RTC  to patient states blood sugar is high today.  Has drank some juice.  Has not taken her meds for today.  Will take and drink plenty of fluids today.  Also feels that her Blood pressure is high today.  No dizziness or headaches.  Will take her meds for today and come for appointment on tomorrow.   Patients was advised to bring in all of her medications on tomorrow.

## 2024-03-09 NOTE — Telephone Encounter (Signed)
 FYI Only or Action Required?: FYI only for provider.  Patient was last seen in primary care on 12/14/2023 by Heddy Barren, DO.  Called Nurse Triage reporting Hyperglycemia.  Symptoms began today.  Symptoms are: unchanged.  Triage Disposition: Home Care  Patient/caregiver understands and will follow disposition?: Yes     Copied from CRM #[id number]. Topic: Clinical - Red Word Triage >> Mar 09, 2024  9:41 AM Alfonso ORN wrote: Red Word that prompted transfer to Nurse Triage: patient blood sugar is high 345  Blood pressure is high not sure about the reading     Reason for Disposition  [1] Blood glucose > 300 mg/dL (83.2 mmol/L) AND [7] does not use insulin  (e.g., not insulin -dependent; most people with type 2 diabetes)  Answer Assessment - Initial Assessment Questions Patient denies any symptoms. Patient will keep her appointment for tomorrow. Patient instructed to call back for new or worsening symptoms. Patient verbalized understanding and agreement with this plan.      1. BLOOD GLUCOSE: What is your blood glucose level?      345 2. ONSET: When did you check the blood glucose?     Today  4. KETONES: Do you check for ketones (urine or blood test strips)? If Yes, ask: What does the test show now?      No 5. TYPE 1 or 2:  Do you know what type of diabetes you have?  (e.g., Type 1, Type 2, Gestational; doesn't know)      Type 2 7. DIABETES PILLS: Do you take any pills for your diabetes? If Yes, ask: Have you missed taking any pills recently?     Has been taking  8. OTHER SYMPTOMS: Do you have any symptoms? (e.g., fever, frequent urination, difficulty breathing, dizziness, weakness, vomiting)     No  Protocols used: Diabetes - High Blood Sugar-A-AH

## 2024-03-10 ENCOUNTER — Encounter: Payer: Self-pay | Admitting: Dietician

## 2024-03-10 ENCOUNTER — Ambulatory Visit: Admitting: Student

## 2024-03-10 ENCOUNTER — Encounter: Payer: Self-pay | Admitting: Student

## 2024-03-10 VITALS — BP 176/107 | HR 79 | Ht 74.0 in | Wt 311.1 lb

## 2024-03-10 DIAGNOSIS — E1100 Type 2 diabetes mellitus with hyperosmolarity without nonketotic hyperglycemic-hyperosmolar coma (NKHHC): Secondary | ICD-10-CM

## 2024-03-10 LAB — GLUCOSE, CAPILLARY: Glucose-Capillary: 297 mg/dL — ABNORMAL HIGH (ref 70–99)

## 2024-03-10 NOTE — Progress Notes (Signed)
 Patient did come to appointment but became frustrated during the physician-patient interview, and walked out of the visit.   Unable to do a full assessment and unable to do full exam.

## 2024-03-11 ENCOUNTER — Telehealth: Payer: Self-pay | Admitting: *Deleted

## 2024-03-11 NOTE — Progress Notes (Signed)
 Care Guide Pharmacy Note  03/11/2024 Name: Brooke Hughes MRN: [id number] DOB: December 18, 1970  Referred By: Tobie Gaines, DO Reason for referral: Complex Care Management (Initial outreach referral to schedule with PharmD and LCSW )   Brooke Hughes is a 53 y.o. year old female who is a primary care patient of Tobie Gaines, DO.  Brooke Hughes was referred to the pharmacist for assistance related to: DMII  Successful contact was made with the patient to discuss pharmacy services including being ready for the pharmacist to call at least 5 minutes before the scheduled appointment time and to have medication bottles and any blood pressure readings ready for review. The patient agreed to meet with the pharmacist via in office  on (date/time). 03/14/24 at 1:30 PM   Harlene Satterfield  [facility], [facility] Guide  Direct Dial: [phone]  Fax [phone]

## 2024-03-11 NOTE — Progress Notes (Signed)
 Complex Care Management Note  Care Guide Note 03/11/2024 Name: Brooke Hughes MRN: [id number] DOB: [birth year]  Brooke Hughes is a 53 y.o. year old female who sees Tobie Gaines, DO for primary care. I reached out to Brooke Hughes by phone today to offer complex care management services.  [name] was given information about Complex Care Management services today including:   The Complex Care Management services include support from the care team which includes your Nurse Care Manager, Clinical Social Worker, or Pharmacist.  The Complex Care Management team is here to help remove barriers to the health concerns and goals most important to you. Complex Care Management services are voluntary, and the patient may decline or stop services at any time by request to their care team member.   Complex Care Management Consent Status: Patient agreed to services and verbal consent obtained.   Follow up plan:  Telephone appointment with complex care management team member scheduled for:  03/16/24  Encounter Outcome:  Patient Scheduled  Harlene Satterfield  [facility]  [facility], [facility] Guide  Direct Dial: [phone]  Fax [phone]

## 2024-03-14 ENCOUNTER — Ambulatory Visit

## 2024-03-14 ENCOUNTER — Telehealth: Payer: Self-pay

## 2024-03-14 NOTE — Telephone Encounter (Signed)
 Noted thank you

## 2024-03-14 NOTE — Telephone Encounter (Signed)
 Copied from CRM [id number]. Topic: Appointments - Transfer of Care >> Mar 14, 2024  3:29 PM Darshell M wrote: Pt is requesting to transfer FROM: Brooke Hughes Pt is requesting to transfer TO: Brooke Hughes Reason for requested transfer: Referred by friend It is the responsibility of the team the patient would like to transfer to [name]) to reach out to the patient if for any reason this transfer is not acceptable.

## 2024-03-14 NOTE — Progress Notes (Deleted)
 03/14/2024 Name: Brooke Hughes MRN: [id number] DOB: 01/27/1971  No chief complaint on file.   Brooke Hughes is a 53 y.o. year old female who was referred for medication management by their primary care provider, Tobie Gaines, DO. They presented for a face to face visit today.   They were referred to the pharmacist by their PCP for assistance in managing diabetes . 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    Subjective: Patient was last seen by PCP, Gaines Tobie, DO on 03/10/24 - however patient left appt early and it was not completed. BP was 176/107 mmHg, HR 79 bpm. She was previously admitted in Aug 2025 at [facility] for hypertensive urgency in the setting of medication nonadherence.she was instructed to restart her home BP medications. Patient refused blood work during hospitalization. Echo demonstrated normal RV and RV systolic function. 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 She was started on Eliquis  with plan for lifelong anticoaguation. A1C was 12.0%, in the setting of medication adherence issues. She was discharged on Lantus 30 units daily.  Amlodipine  10 mg daily, losartan 100 mg daily, hydralazine  50 mg TID.  Today, patient presents in *** good spirits and presents without *** any assistance. ***Patient is accompanied by ***.    Care Team: Primary Care Provider: Tobie Gaines, DO ; Next Scheduled Visit: *** {careteamprovider:27366}  Medication Access/Adherence  Current Pharmacy:  [facility] [address] [place] [address] Phone: [phone] Fax: [phone]  [facility] [address], [address] [place] [address] Phone: [phone] Fax: [phone]  [facility] [phone] [place] [address] [facility] [address] [facility] [address] [place] [address] Phone: [phone] Fax: [phone]  [facility] [address], [address] [place] [address] Phone: [phone] Fax: [phone]   Patient reports affordability concerns with their medications: {YES/NO:21197} Patient reports access/transportation concerns to their pharmacy: {YES/NO:21197} Patient reports adherence concerns with their medications:  {YES/NO:21197} ***  *** Patient denies adherence with medications, reports missing *** medications *** times per week, on average.   Diabetes:  Current medications: *** Medications tried in the past: ***  Current glucose readings: ***  Date of Download: *** % Time CGM is active: ***% Average Glucose: *** mg/dL Glucose Management Indicator: ***  Glucose Variability: *** (goal <36%) Time in Goal:  - Time in range 70-180: ***% - Time above range: ***% - Time below range: ***% Observed patterns:  Patient {Actions; denies-reports:120008} hypoglycemic s/sx including ***dizziness, shakiness, sweating. Patient {Actions; denies-reports:120008} hyperglycemic symptoms including ***polyuria, polydipsia, polyphagia, nocturia, neuropathy, blurred vision.  Current meal patterns:  - Breakfast: *** - Lunch *** - Supper *** - Snacks *** - Drinks ***  Current physical activity: ***  Current medication access support: ***  Macrovascular and Microvascular Risk Reduction:  Statin? {DM Statin Pharmacy:33491}; ACEi/ARB? {DM ACEi/ARB pharmacy:33492} Last urinary albumin/creatinine ratio:  Lab Results  Component Value Date   MICRALBCREAT 5 12/14/2023   MICRALBCREAT <5 05/01/2022   Last eye exam:   Last foot exam: 03/10/2024 Tobacco Use:  Tobacco Use: Low Risk  (03/10/2024)   Patient History    Smoking Tobacco Use: Never    Smokeless Tobacco Use: Never  Passive Exposure: Not on file    Hypertension:  Current medications: *** Medications previously tried:   Patient {HAS/DOES NOT YJCZ:65250} a validated, automated, upper arm home BP cuff Current blood pressure readings readings: ***  Patient {Actions; denies-reports:120008} hypotensive s/sx including ***dizziness, lightheadedness.  Patient {Actions; denies-reports:120008} hypertensive symptoms including ***headache, chest pain, shortness of breath  Current meal patterns: ***  Current physical activity: ***   Hyperlipidemia/ASCVD Risk Reduction  Current lipid lowering medications:  Medications tried in the past:   Antiplatelet regimen:   ASCVD History:  Family History:  Risk Factors:   Current physical activity: ***  Current medication access support: ***  PREVENT Risk Score:  OMesothelioma.fr - 10 year risk of CVD: *** - 10 year risk of ASCVD: *** - 10 year risk of HF: ***   Objective:  BP Readings from Last 3 Encounters:  03/10/24 (!) 176/107  12/14/23 (!) 148/95  12/01/23 (!) 153/82    Lab Results  Component Value Date   HGBA1C 12.0 02/10/2024   HGBA1C >14.0 (A) 12/01/2023   HGBA1C 13.1 (A) 03/23/2023       Latest Ref Rng & Units 12/01/2023    4:11 PM 03/23/2023   10:00 AM 09/04/2022   11:57 AM  BMP  Glucose 70 - 99 mg/dL 595  722  738   BUN 6 - 24 mg/dL 8  10  8    Creatinine 0.57 - 1.00 mg/dL 9.02  9.21  9.13   BUN/Creat Ratio 9 - 23 8  13  9    Sodium 134 - 144 mmol/L 137  139  138   Potassium 3.5 - 5.2 mmol/L 3.9  4.4  4.1   Chloride 96 - 106 mmol/L 100  100  101   CO2 20 - 29 mmol/L 22  23  23    Calcium  8.7 - 10.2 mg/dL 9.7  9.9  9.7     No results found for: CHOL, HDL, LDLCALC, LDLDIRECT, TRIG, CHOLHDL  Medications Reviewed Today   Medications were not reviewed in this encounter       Assessment/Plan:   Diabetes: - Currently {CHL Controlled/[phone]}  with most recent A1C of *** {Desc; above/below:16086} goal {CCM A1c GOALS:21091546}. Medication adherence appears ***. Control is suboptimal due to ***.   - Last UACR *** - Patient denies personal or family history of multiple endocrine neoplasia type 2, medullary thyroid cancer; personal history of pancreatitis or gallbladder disease.*** - Reviewed long term cardiovascular and renal outcomes of uncontrolled blood sugar - Reviewed goal A1c, goal fasting, and goal 2 hour post prandial glucose - Reviewed hypoglycemia management plan and the rule of 15*** - Reviewed dietary modifications including *** utilizing the healthy plate method, limiting portion size of carbohydrate foods, increasing intake of protein and non-starchy vegetables. Counseled patient to stay hydrated with water throughout the day. - Reviewed lifestyle modifications including: aiming for 150 minutes of moderate intensity exercise every week. *** - Recommend to ***  - Recommend to check glucose twice daily: fasting and 2-hr PPG ***. Counseled patient to bring glucometer or BG log to every appointment. - Next A1C due ***    Meets financial criteria for *** patient assistance program through ***. Will collaborate with provider, CPhT, and patient to pursue assistance.    Hypertension: - Currently {CHL Controlled/[phone]} with *** BP consistently {Desc; above/below:16086} goal {BP Goal:27557}. Patient {ACTION; IS/IS WNU:78978602} having s/sx of hypo- or hyper-tension. Medication adherence appears {Desc; appropriate/inappropriate:30686}. Control is suboptimal due to ***.  - Reviewed long term cardiovascular and  renal outcomes of uncontrolled blood pressure - Reviewed appropriate blood pressure monitoring technique and reviewed goal blood pressure. Recommended to check home blood pressure and heart rate *** once daily and keep a log to bring to upcoming appointments - Recommend to ***     Hyperlipidemia/ASCVD Risk  Reduction: - Currently {CHL Controlled/Uncontrolled:[phone]} with most recent LDL-C of *** {Desc; above/below:16086} goal {LDL Goals:32982} given ***. *** intensity statin indicated. - Reviewed long term complications of uncontrolled cholesterol - Reviewed lifestyle recommendations to lower LDL-C including regular physical activity, 5-10% weight loss, eating a diet low in saturated fat, and increasing intake of fiber to at least 25 g per day. - Reviewed lifestyle recommendations to lower TG including following a low-carb diet, restricting alcohol intake, and increasing physical activity and exercise - Recommend to ***    Written patient instructions provided. Patient verbalized understanding of treatment plan.   Follow Up Plan:  Pharmacist *** PCP clinic visit in ***   Lorain Baseman, PharmD [facility] [phone]

## 2024-03-15 ENCOUNTER — Telehealth: Payer: Self-pay

## 2024-03-15 NOTE — Telephone Encounter (Signed)
 Attempted to contact patient to reschedule appointment for medication management. Left HIPAA compliant message for patient to return my call at their convenience.   Lorain Baseman, PharmD [facility] [phone]

## 2024-03-16 ENCOUNTER — Other Ambulatory Visit: Payer: Self-pay | Admitting: Licensed Clinical Social Worker

## 2024-03-22 ENCOUNTER — Emergency Department (HOSPITAL_COMMUNITY)
Admission: EM | Admit: 2024-03-22 | Discharge: 2024-03-22 | Disposition: A | Discharge status: Home / Self Care | Attending: Emergency Medicine | Admitting: Emergency Medicine

## 2024-03-22 ENCOUNTER — Emergency Department (HOSPITAL_COMMUNITY)

## 2024-03-22 ENCOUNTER — Other Ambulatory Visit: Payer: Self-pay

## 2024-03-22 ENCOUNTER — Telehealth: Payer: Self-pay | Admitting: *Deleted

## 2024-03-22 DIAGNOSIS — S161XXA Strain of muscle, fascia and tendon at neck level, initial encounter: Secondary | ICD-10-CM | POA: Diagnosis not present

## 2024-03-22 DIAGNOSIS — W208XXA Other cause of strike by thrown, projected or falling object, initial encounter: Secondary | ICD-10-CM | POA: Diagnosis not present

## 2024-03-22 DIAGNOSIS — I10 Essential (primary) hypertension: Secondary | ICD-10-CM | POA: Diagnosis not present

## 2024-03-22 DIAGNOSIS — Y92007 Garden or yard of unspecified non-institutional (private) residence as the place of occurrence of the external cause: Secondary | ICD-10-CM | POA: Insufficient documentation

## 2024-03-22 DIAGNOSIS — R739 Hyperglycemia, unspecified: Secondary | ICD-10-CM | POA: Insufficient documentation

## 2024-03-22 DIAGNOSIS — S0083XA Contusion of other part of head, initial encounter: Secondary | ICD-10-CM | POA: Insufficient documentation

## 2024-03-22 DIAGNOSIS — E042 Nontoxic multinodular goiter: Secondary | ICD-10-CM | POA: Diagnosis not present

## 2024-03-22 DIAGNOSIS — Z7984 Long term (current) use of oral hypoglycemic drugs: Secondary | ICD-10-CM | POA: Diagnosis not present

## 2024-03-22 DIAGNOSIS — E876 Hypokalemia: Secondary | ICD-10-CM | POA: Insufficient documentation

## 2024-03-22 DIAGNOSIS — Z79899 Other long term (current) drug therapy: Secondary | ICD-10-CM | POA: Diagnosis not present

## 2024-03-22 DIAGNOSIS — S199XXA Unspecified injury of neck, initial encounter: Secondary | ICD-10-CM | POA: Diagnosis present

## 2024-03-22 DIAGNOSIS — W19XXXA Unspecified fall, initial encounter: Secondary | ICD-10-CM

## 2024-03-22 DIAGNOSIS — R030 Elevated blood-pressure reading, without diagnosis of hypertension: Secondary | ICD-10-CM

## 2024-03-22 DIAGNOSIS — S0093XA Contusion of unspecified part of head, initial encounter: Secondary | ICD-10-CM

## 2024-03-22 LAB — CBC
HCT: 40.2 % (ref 36.0–46.0)
Hemoglobin: 12.7 g/dL (ref 12.0–15.0)
MCH: 25.7 pg — ABNORMAL LOW (ref 26.0–34.0)
MCHC: 31.6 g/dL (ref 30.0–36.0)
MCV: 81.4 fL (ref 80.0–100.0)
Platelets: 292 K/uL (ref 150–400)
RBC: 4.94 MIL/uL (ref 3.87–5.11)
RDW: 13.6 % (ref 11.5–15.5)
WBC: 5.3 K/uL (ref 4.0–10.5)
nRBC: 0 % (ref 0.0–0.2)

## 2024-03-22 LAB — BASIC METABOLIC PANEL WITH GFR
Anion gap: 10 (ref 5–15)
BUN: 11 mg/dL (ref 6–20)
CO2: 25 mmol/L (ref 22–32)
Calcium: 9.4 mg/dL (ref 8.9–10.3)
Chloride: 104 mmol/L (ref 98–111)
Creatinine, Ser: 0.93 mg/dL (ref 0.44–1.00)
GFR, Estimated: >60 mL/min (ref >60)
Glucose, Bld: 310 mg/dL — ABNORMAL HIGH (ref 70–99)
Potassium: 3.4 mmol/L — ABNORMAL LOW (ref 3.5–5.1)
Sodium: 139 mmol/L (ref 135–145)

## 2024-03-22 MED ORDER — ACETAMINOPHEN 500 MG PO TABS
1000.0000 mg | ORAL_TABLET | Freq: Once | ORAL | Status: AC
  Administered 2024-03-22: 1000 mg via ORAL
  Filled 2024-03-22: qty 2

## 2024-03-22 MED ORDER — METHOCARBAMOL 750 MG PO TABS
750.0000 mg | ORAL_TABLET | Freq: Three times a day (TID) | ORAL | 0 refills | Status: AC | PRN
  Filled 2024-03-22: qty 15, 5d supply, fill #0

## 2024-03-22 MED ORDER — LISINOPRIL 10 MG PO TABS
10.0000 mg | ORAL_TABLET | Freq: Once | ORAL | Status: AC
  Administered 2024-03-22: 10 mg via ORAL
  Filled 2024-03-22: qty 1

## 2024-03-22 MED ORDER — IBUPROFEN 400 MG PO TABS
400.0000 mg | ORAL_TABLET | Freq: Once | ORAL | Status: AC
  Administered 2024-03-22: 400 mg via ORAL
  Filled 2024-03-22: qty 1

## 2024-03-22 MED ORDER — POTASSIUM CHLORIDE 20 MEQ PO PACK
40.0000 meq | PACK | Freq: Once | ORAL | Status: AC
Start: 2024-03-22 — End: 2024-03-22
  Administered 2024-03-22: 40 meq via ORAL
  Filled 2024-03-22: qty 2

## 2024-03-22 NOTE — Progress Notes (Unsigned)
 Complex Care Management Care Guide Note  03/22/2024 Name: Brooke Hughes MRN: [id number] DOB: 20-Sep-1970  Brooke Hughes is a 53 y.o. year old female who is a primary care patient of Tobie Gaines, DO and is actively engaged with the care management team. I reached out to Brooke Hughes by phone today to assist with re-scheduling  with the Pharmacist Licensed Clinical Social Worker.  Follow up plan: Unsuccessful telephone outreach attempt made. A HIPAA compliant phone message was left for the patient providing contact information and requesting a return call. Patient disconnected call with Care guide.   Harlene Satterfield  [facility]  [facility], [facility] Guide  Direct Dial: [phone]  Fax [phone]

## 2024-03-22 NOTE — ED Notes (Signed)
 Offered fluids and food. Patient resting in bed at this time. Patient encouraged to eat food and drink fluids. Will ambulate in hall once patient has had time to eat snack.

## 2024-03-22 NOTE — Discharge Instructions (Addendum)
 It was our pleasure to provide your ER care today - we hope that you feel better. Take acetaminophen  or ibuprofen  as need for pain. You may also take robaxin  as need for muscle pain/spasm - no driving when taking.   From today's labs, your blood sugar is high - drink plenty of water/fluids, follow diabetes meal plan, continue meds, monitor sugars and record values, and follow up closely with primary care doctor in the coming week. Your potassium level is mildly low - eat plenty of fruits and vegetables, and follow up with your doctor.   Your imaging tests made incidental note of bilateral thyroid nodules - follow up with your doctor in the next 1-2 weeks, discuss this test result and have them arrange outpatient ultrasound and further follow up.  Your blood pressure is also high  - continue meds and follow up with your doctor in the coming week.   Return to ER if worse, new symptoms, new/severe pain, trouble breathing, fevers, numbness/weakness, fainting, or other emergency concern.

## 2024-03-22 NOTE — ED Triage Notes (Signed)
 Patient BIB EMS for evaluation of neck pain. Patient reports she was having some trees cut down and was knocked over by [place] tractor. Patient reports she rolled down hill. Unknown LOC. Patient reports she is on eliquis  but has not taken it in the last two days. Patient reports she has been up walking around since the incident. Patient complaining of pain in the neck and to the top of the head.

## 2024-03-22 NOTE — ED Provider Notes (Addendum)
 [place] EMERGENCY DEPARTMENT AT [facility] Provider Note   CSN: [id number] Arrival date & time: 03/22/24  8078     Patient presents with: Neck Pain   Brooke Hughes is a 53 y.o. female.   Pt s/p fall. Was helping a friend w yardwork, a tree was down on ground and bobcat was pushing on it, and a limb/branch swung and knocked patient down. No loc. Felt fine/asymptomatic all day including prior to injury. Mild pain to top of head. No severe headaches.  C/o pain to mid neck posteriorly. No radicular pain. No associated numbness/weakness. Denies back pain. No extremity pain or injury. Skin intact. No sob. No abd pain or nv. Hx htn, has not taken meds today.   The history is provided by the patient, medical records and the EMS personnel.  Neck Pain Associated symptoms: no chest pain, no fever, no numbness and no weakness        Prior to Admission medications   Medication Sig Start Date End Date Taking? Authorizing Provider  methocarbamol  (ROBAXIN ) 750 MG tablet Take 1 tablet (750 mg total) by mouth 3 (three) times daily as needed (muscle spasm/pain). 03/22/24  Yes Bernard Drivers, MD  Blood Glucose Monitoring Suppl (ONETOUCH VERIO) w/Device KIT 1 each by Does not apply route as needed. 05/23/22   Jolaine Pac, DO  dapagliflozin  propanediol (FARXIGA ) 10 MG TABS tablet Take 1 tablet (10 mg total) by mouth daily. 12/14/23   Tawkaliyar, Roya, DO  glucose blood (ONETOUCH VERIO) test strip 1 each by Other route 3 (three) times daily. Use as instructed 05/23/22   Jolaine Pac, DO  Lancets 30G MISC 1 each by Does not apply route every 8 (eight) hours. 05/22/22   Jolaine Pac, DO  lisinopril  (ZESTRIL ) 20 MG tablet Take 1 tablet (20 mg total) by mouth daily. 12/01/23   Tobie Gaines, DO  metFORMIN  (GLUCOPHAGE ) 500 MG tablet Take 2 tablets (1,000 mg total) by mouth 2 (two) times daily with a meal. 12/01/23   Tobie Gaines, DO    Allergies: Codeine, Vicodin [hydrocodone -acetaminophen ],  and Penicillins    Review of Systems  Constitutional:  Negative for chills and fever.  HENT:  Negative for nosebleeds.   Eyes:  Negative for pain, redness and visual disturbance.  Respiratory:  Negative for cough and shortness of breath.   Cardiovascular:  Negative for chest pain and leg swelling.  Gastrointestinal:  Negative for abdominal pain, nausea and vomiting.  Genitourinary:  Negative for flank pain.  Musculoskeletal:  Positive for neck pain. Negative for back pain.  Skin:  Negative for wound.  Neurological:  Negative for weakness and numbness.  Psychiatric/Behavioral:  Negative for confusion.     Updated Vital Signs BP (!) 176/97 (BP Location: Right Arm)   Pulse 71   Temp 98.2 F (36.8 C) (Oral)   Resp 12   SpO2 98%   Physical Exam Vitals and nursing note reviewed.  Constitutional:      Appearance: Normal appearance. She is well-developed.  HENT:     Head:     Comments: Mild tenderness superior scalp. No sts noted.     Right Ear: Tympanic membrane normal.     Left Ear: Tympanic membrane normal.     Nose: Nose normal.     Mouth/Throat:     Mouth: Mucous membranes are moist.  Eyes:     General: No scleral icterus.    Conjunctiva/sclera: Conjunctivae normal.     Pupils: Pupils are equal, round, and reactive to  light.  Neck:     Vascular: No carotid bruit.     Trachea: No tracheal deviation.     Comments: Trachea midline.  Cardiovascular:     Rate and Rhythm: Normal rate and regular rhythm.     Pulses: Normal pulses.     Heart sounds: Normal heart sounds. No murmur heard.    No friction rub. No gallop.  Pulmonary:     Effort: Pulmonary effort is normal. No respiratory distress.     Breath sounds: Normal breath sounds.     Comments: Mild anterior chest wall tenderness. Normal chest movement. No crepitus.  Abdominal:     General: Bowel sounds are normal. There is no distension.     Palpations: Abdomen is soft.     Tenderness: There is no abdominal tenderness.  There is no guarding.     Comments: No abd contusion or bruising  Genitourinary:    Comments: No cva tenderness.  Musculoskeletal:        General: No swelling.     Cervical back: Normal range of motion and neck supple. No rigidity. No muscular tenderness.     Comments: Mid cervical tenderness, otherwise, CTLS spine, non tender, aligned, no step off. Good rom bil extremities without pain or focal sts or tenderness.   Skin:    General: Skin is warm and dry.     Findings: No rash.  Neurological:     Mental Status: She is alert.     Comments: Alert, speech normal. GCS 15. Motor/sens grossly intact bil.   Psychiatric:        Mood and Affect: Mood normal.     (all labs ordered are listed, but only abnormal results are displayed) Results for orders placed or performed during the hospital encounter of 03/22/24  CBC   Collection Time: 03/22/24  8:06 PM  Result Value Ref Range   WBC 5.3 4.0 - 10.5 K/uL   RBC 4.94 3.87 - 5.11 MIL/uL   Hemoglobin 12.7 12.0 - 15.0 g/dL   HCT 59.7 63.9 - 53.9 %   MCV 81.4 80.0 - 100.0 fL   MCH 25.7 (L) 26.0 - 34.0 pg   MCHC 31.6 30.0 - 36.0 g/dL   RDW 86.3 88.4 - 84.4 %   Platelets 292 150 - 400 K/uL   nRBC 0.0 0.0 - 0.2 %  Basic metabolic panel with GFR   Collection Time: 03/22/24  8:06 PM  Result Value Ref Range   Sodium 139 135 - 145 mmol/L   Potassium 3.4 (L) 3.5 - 5.1 mmol/L   Chloride 104 98 - 111 mmol/L   CO2 25 22 - 32 mmol/L   Glucose, Bld 310 (H) 70 - 99 mg/dL   BUN 11 6 - 20 mg/dL   Creatinine, Ser 9.06 0.44 - 1.00 mg/dL   Calcium  9.4 8.9 - 10.3 mg/dL   GFR, Estimated >39 >39 mL/min   Anion gap 10 5 - 15   CT Head Wo Contrast Result Date: 03/22/2024 CLINICAL DATA:  Head trauma, coagulopathy (Age 71-64y); Neck trauma, midline tenderness (Age 20-64y) EXAM: CT HEAD WITHOUT CONTRAST CT CERVICAL SPINE WITHOUT CONTRAST TECHNIQUE: Multidetector CT imaging of the head and cervical spine was performed following the standard protocol without  intravenous contrast. Multiplanar CT image reconstructions of the cervical spine were also generated. RADIATION DOSE REDUCTION: This exam was performed according to the departmental dose-optimization program which includes automated exposure control, adjustment of the mA and/or kV according to patient size and/or use of iterative  reconstruction technique. COMPARISON:  CT head and 08/26/2021 FINDINGS: CT HEAD FINDINGS Brain: No evidence of large-territorial acute infarction. No parenchymal hemorrhage. No mass lesion. No extra-axial collection. No mass effect or midline shift. No hydrocephalus. Basilar cisterns are patent. Vascular: No hyperdense vessel. Skull: No acute fracture or focal lesion. Sinuses/Orbits: Paranasal sinuses and mastoid air cells are clear. The orbits are unremarkable. Other: None. CT CERVICAL SPINE FINDINGS Alignment: Normal. Skull base and vertebrae: Multilevel mild-to-moderate degenerative changes of the spine. No acute fracture. No aggressive appearing focal osseous lesion or focal pathologic process. Soft tissues and spinal canal: No prevertebral fluid or swelling. No visible canal hematoma. Upper chest: Unremarkable. Other: 1.6 cm hypodense right thyroid gland nodule. 1.8 cm left thyroid gland hypodense nodule. IMPRESSION: 1. No acute intracranial abnormality. 2. No acute displaced fracture or traumatic listhesis of the cervical spine. 3. Bilateral thyroid gland nodules. Recommend outpatient thyroid US  (ref: J Am Coll Radiol. 2015 Feb;12(2): 143-50). Electronically Signed   By: Morgane  Naveau M.D.   On: 03/22/2024 20:50   CT Cervical Spine Wo Contrast Result Date: 03/22/2024 CLINICAL DATA:  Head trauma, coagulopathy (Age 52-64y); Neck trauma, midline tenderness (Age 51-64y) EXAM: CT HEAD WITHOUT CONTRAST CT CERVICAL SPINE WITHOUT CONTRAST TECHNIQUE: Multidetector CT imaging of the head and cervical spine was performed following the standard protocol without intravenous contrast.  Multiplanar CT image reconstructions of the cervical spine were also generated. RADIATION DOSE REDUCTION: This exam was performed according to the departmental dose-optimization program which includes automated exposure control, adjustment of the mA and/or kV according to patient size and/or use of iterative reconstruction technique. COMPARISON:  CT head and 08/26/2021 FINDINGS: CT HEAD FINDINGS Brain: No evidence of large-territorial acute infarction. No parenchymal hemorrhage. No mass lesion. No extra-axial collection. No mass effect or midline shift. No hydrocephalus. Basilar cisterns are patent. Vascular: No hyperdense vessel. Skull: No acute fracture or focal lesion. Sinuses/Orbits: Paranasal sinuses and mastoid air cells are clear. The orbits are unremarkable. Other: None. CT CERVICAL SPINE FINDINGS Alignment: Normal. Skull base and vertebrae: Multilevel mild-to-moderate degenerative changes of the spine. No acute fracture. No aggressive appearing focal osseous lesion or focal pathologic process. Soft tissues and spinal canal: No prevertebral fluid or swelling. No visible canal hematoma. Upper chest: Unremarkable. Other: 1.6 cm hypodense right thyroid gland nodule. 1.8 cm left thyroid gland hypodense nodule. IMPRESSION: 1. No acute intracranial abnormality. 2. No acute displaced fracture or traumatic listhesis of the cervical spine. 3. Bilateral thyroid gland nodules. Recommend outpatient thyroid US  (ref: J Am Coll Radiol. 2015 Feb;12(2): 143-50). Electronically Signed   By: Morgane  Naveau M.D.   On: 03/22/2024 20:50   DG Chest Port 1 View Result Date: 03/22/2024 CLINICAL DATA:  Pain after a fall. EXAM: PORTABLE CHEST 1 VIEW COMPARISON:  07/29/2020 FINDINGS: The heart size and mediastinal contours are within normal limits. Both lungs are clear. The visualized skeletal structures are unremarkable. IMPRESSION: No active disease. Electronically Signed   By: Elsie Gravely M.D.   On: 03/22/2024 20:24      EKG: EKG Interpretation Date/Time:  Tuesday March 22 2024 19:41:24 EDT Ventricular Rate:  72 PR Interval:  164 QRS Duration:  92 QT Interval:  380 QTC Calculation: 416 R Axis:   -15  Text Interpretation: Sinus rhythm Confirmed by Bernard Drivers ([id number]) on 03/22/2024 7:43:15 PM  Radiology: CT Head Wo Contrast Result Date: 03/22/2024 CLINICAL DATA:  Head trauma, coagulopathy (Age 46-64y); Neck trauma, midline tenderness (Age 69-64y) EXAM: CT HEAD WITHOUT CONTRAST CT CERVICAL SPINE  WITHOUT CONTRAST TECHNIQUE: Multidetector CT imaging of the head and cervical spine was performed following the standard protocol without intravenous contrast. Multiplanar CT image reconstructions of the cervical spine were also generated. RADIATION DOSE REDUCTION: This exam was performed according to the departmental dose-optimization program which includes automated exposure control, adjustment of the mA and/or kV according to patient size and/or use of iterative reconstruction technique. COMPARISON:  CT head and 08/26/2021 FINDINGS: CT HEAD FINDINGS Brain: No evidence of large-territorial acute infarction. No parenchymal hemorrhage. No mass lesion. No extra-axial collection. No mass effect or midline shift. No hydrocephalus. Basilar cisterns are patent. Vascular: No hyperdense vessel. Skull: No acute fracture or focal lesion. Sinuses/Orbits: Paranasal sinuses and mastoid air cells are clear. The orbits are unremarkable. Other: None. CT CERVICAL SPINE FINDINGS Alignment: Normal. Skull base and vertebrae: Multilevel mild-to-moderate degenerative changes of the spine. No acute fracture. No aggressive appearing focal osseous lesion or focal pathologic process. Soft tissues and spinal canal: No prevertebral fluid or swelling. No visible canal hematoma. Upper chest: Unremarkable. Other: 1.6 cm hypodense right thyroid gland nodule. 1.8 cm left thyroid gland hypodense nodule. IMPRESSION: 1. No acute intracranial abnormality.  2. No acute displaced fracture or traumatic listhesis of the cervical spine. 3. Bilateral thyroid gland nodules. Recommend outpatient thyroid US  (ref: J Am Coll Radiol. 2015 Feb;12(2): 143-50). Electronically Signed   By: Morgane  Naveau M.D.   On: 03/22/2024 20:50   CT Cervical Spine Wo Contrast Result Date: 03/22/2024 CLINICAL DATA:  Head trauma, coagulopathy (Age 82-64y); Neck trauma, midline tenderness (Age 42-64y) EXAM: CT HEAD WITHOUT CONTRAST CT CERVICAL SPINE WITHOUT CONTRAST TECHNIQUE: Multidetector CT imaging of the head and cervical spine was performed following the standard protocol without intravenous contrast. Multiplanar CT image reconstructions of the cervical spine were also generated. RADIATION DOSE REDUCTION: This exam was performed according to the departmental dose-optimization program which includes automated exposure control, adjustment of the mA and/or kV according to patient size and/or use of iterative reconstruction technique. COMPARISON:  CT head and 08/26/2021 FINDINGS: CT HEAD FINDINGS Brain: No evidence of large-territorial acute infarction. No parenchymal hemorrhage. No mass lesion. No extra-axial collection. No mass effect or midline shift. No hydrocephalus. Basilar cisterns are patent. Vascular: No hyperdense vessel. Skull: No acute fracture or focal lesion. Sinuses/Orbits: Paranasal sinuses and mastoid air cells are clear. The orbits are unremarkable. Other: None. CT CERVICAL SPINE FINDINGS Alignment: Normal. Skull base and vertebrae: Multilevel mild-to-moderate degenerative changes of the spine. No acute fracture. No aggressive appearing focal osseous lesion or focal pathologic process. Soft tissues and spinal canal: No prevertebral fluid or swelling. No visible canal hematoma. Upper chest: Unremarkable. Other: 1.6 cm hypodense right thyroid gland nodule. 1.8 cm left thyroid gland hypodense nodule. IMPRESSION: 1. No acute intracranial abnormality. 2. No acute displaced fracture  or traumatic listhesis of the cervical spine. 3. Bilateral thyroid gland nodules. Recommend outpatient thyroid US  (ref: J Am Coll Radiol. 2015 Feb;12(2): 143-50). Electronically Signed   By: Morgane  Naveau M.D.   On: 03/22/2024 20:50   DG Chest Port 1 View Result Date: 03/22/2024 CLINICAL DATA:  Pain after a fall. EXAM: PORTABLE CHEST 1 VIEW COMPARISON:  07/29/2020 FINDINGS: The heart size and mediastinal contours are within normal limits. Both lungs are clear. The visualized skeletal structures are unremarkable. IMPRESSION: No active disease. Electronically Signed   By: Elsie Gravely M.D.   On: 03/22/2024 20:24     Procedures   Medications Ordered in the ED  potassium chloride (KLOR-CON) packet 40 mEq (40  mEq Oral Given 03/22/24 2128)  ibuprofen  (ADVIL ) tablet 400 mg (400 mg Oral Given 03/22/24 2128)  acetaminophen  (TYLENOL ) tablet 1,000 mg (1,000 mg Oral Given 03/22/24 2128)  lisinopril  (ZESTRIL ) tablet 10 mg (10 mg Oral Given 03/22/24 2128)                                    Medical Decision Making Problems Addressed: Accidental fall, initial encounter: acute illness or injury with systemic symptoms that poses a threat to life or bodily functions Cervical strain, acute, initial encounter: acute illness or injury with systemic symptoms Contusion of head, initial encounter: acute illness or injury with systemic symptoms that poses a threat to life or bodily functions Elevated blood pressure reading: acute illness or injury Essential hypertension: chronic illness or injury with exacerbation, progression, or side effects of treatment that poses a threat to life or bodily functions Hyperglycemia: acute illness or injury Hypokalemia: acute illness or injury Multiple thyroid nodules: chronic illness or injury    Details: Rec close pcp f/u  Amount and/or Complexity of Data Reviewed Independent Historian: EMS    Details: hx External Data Reviewed: notes. Labs: ordered. Decision-making  details documented in ED Course. Radiology: ordered and independent interpretation performed. Decision-making details documented in ED Course. ECG/medicine tests: ordered and independent interpretation performed. Decision-making details documented in ED Course.  Risk OTC drugs. Prescription drug management. Decision regarding hospitalization.  Iv ns. Continuous pulse ox and cardiac monitoring. Labs ordered/sent. Imaging ordered.   Differential diagnosis includes head injury, neck injury, etc. Dispo decision including potential need for admission considered - will get labs and imaging and reassess.   Reviewed nursing notes and prior charts for additional history. External reports reviewed. Additional history from: EMS.   Cardiac monitor: sinus rhythm, rate  Labs reviewed/interpreted by me - wbc and hgb normal. Glucose elevated. Hx dm. Hco3 normal, ag normal. Po fluids. Pt has not taken her bp meds yet today - given dose. Bp does not require further emergent lowering.   Acetaminophen  po, ibuprofen  po. Po fluids.   Recheck, chest cta. Abd soft nt. CTLS spine, non tender, no persistent midline bony tenderness,  aligned, no step off. No c/o of radicular pain or any numbness/weakness.   Xrays reviewed/interpreted by me - no fx or ptx.   CT reviewed/interpreted by me - no hem or fx.   Po fluids/food, ambulate in hall.   Pt currently appears stable for ED d/c.   Rec close pcp f/u, for todays visit/symptoms, as well as bp, glucose and thyroid nodules.   Return precautions provided.       Final diagnoses:  Accidental fall, initial encounter  Contusion of head, initial encounter  Cervical strain, acute, initial encounter  Hyperglycemia  Hypokalemia  Elevated blood pressure reading  Essential hypertension  Multiple thyroid nodules    ED Discharge Orders          Ordered    methocarbamol  (ROBAXIN ) 750 MG tablet  3 times daily PRN        03/22/24 2132                Bernard Drivers, MD 03/22/24 2132    Bernard Drivers, MD 03/22/24 2207

## 2024-03-23 ENCOUNTER — Other Ambulatory Visit (HOSPITAL_COMMUNITY): Payer: Self-pay

## 2024-03-23 NOTE — Progress Notes (Unsigned)
 Complex Care Management Care Guide Note  03/23/2024 Name: Brooke Hughes MRN: [id number] DOB: 04/29/1971  Brooke Hughes is a 53 y.o. year old female who is a primary care patient of Tobie Gaines, DO and is actively engaged with the care management team. I reached out to Brooke Hughes by phone today to assist with re-scheduling  with the Pharmacist Licensed Clinical Social Worker.  Follow up plan: Unsuccessful telephone outreach attempt made. A HIPAA compliant phone message was left for the patient providing contact information and requesting a return call.  Harlene Satterfield  [facility]  [facility], [facility] Guide  Direct Dial: [phone]  Fax [phone]

## 2024-03-24 NOTE — Progress Notes (Signed)
 Complex Care Management Care Guide Note  03/24/2024 Name: Brooke Hughes MRN: [id number] DOB: 10/05/1970  Brooke Hughes is a 53 y.o. year old female who is a primary care patient of Tobie Gaines, DO and is actively engaged with the care management team. I reached out to Brooke Hughes by phone today to assist with re-scheduling  with the Pharmacist Licensed Clinical Social Worker.  Follow up plan: Unsuccessful telephone outreach attempt made. A HIPAA compliant phone message was left for the patient providing contact information and requesting a return call. No further outreach attempts will be made at this time. We have been unable to contact the patient to reschedule for complex care management services.  Harlene Satterfield  [facility]  Value[facility]Based Care Institute, [facility] Guide  Direct Dial: [phone]  Fax [phone]

## 2024-03-29 NOTE — ED Provider Notes (Signed)
 ------------------------------------------------------------------------------- Attestation signed by Aloha Theo Furth, MD at 04/06/24 [phone] Patient encounter was completed by the APP listed on this note. I was not directly involved in the care of this patient; however, I was present in the department and available to offer assistance at any moment if requested. -------------------------------------------------------------------------------   [facility]  ED Provider Note  Brooke Hughes 53 y.o. female DOB: 1971-01-22 MRN: [id number] History   Chief Complaint  Patient presents with  . Leg Swelling    Pt BIB EMS for concern of DVT in left leg. Pt has bilateral lower extremity edema worse on left and pain in calf. Pt is on eliquis .    53 year old female presents for evaluation of left leg swelling shortness of breath and right ear pain.  She has a history of a DVT in the LLE.  She denies recent injury or trauma.  She endorses chest tightness but denies palpitations or diaphoresis.  She denies abdominal pain, nausea, vomiting, diarrhea or constipation.  The patient is currently taking Eliquis  and states she is compliant with her medication.   History provided by:  Patient Language interpreter used: No   Leg Swelling Associated symptoms: swelling   Associated symptoms: no back pain, no decreased range of motion, no fatigue, no fever, no muscle weakness, no numbness, no stiffness and no tingling        Past Medical History:  Diagnosis Date  . Diabetes mellitus, type 2 (*)   . Hypertension   . Patient noncompliant with anticoagulant medication   . Stroke (*)     No past surgical history on file.  Social History   Substance and Sexual Activity  Alcohol Use Never   Tobacco Use History[1] E-Cigarettes  . Vaping Use Never User   . Start Date    . Cartridges/Day    . Quit Date     Social History   Substance and Sexual Activity  Drug Use  Never         Allergies[2]  Discharge Medication List as of 03/29/2024  8:58 PM     CONTINUE these medications which have NOT CHANGED   Details  amLODIPine  besylate (NORVASC) 10 mg tablet Take one tablet (10 mg dose) by mouth daily., Starting Fri 02/12/2024, Until Thu 05/12/2024, Normal    aspirin  81 mg chewable tablet Chew one tablet (81 mg dose) by mouth daily., Starting Sat 02/13/2024, Until Sun 02/12/2025, Normal    hydrALAZINE  HCl (APRESOLINE ) 50 mg tablet Take one tablet (50 mg dose) by mouth every 8 (eight) hours., Starting Fri 02/12/2024, Until Thu 05/12/2024, Normal    insulin  glargine (LANTUS,BASAGLAR ) 100 units/mL pen Inject thirty (30) Units into the skin daily., Starting Fri 02/12/2024, Until Thu 05/12/2024, Normal    Insulin  Pen Needle (BD,SURE COMFORT,NOVOFINE) 32G X 4 MM MISC Use at bedtime as directed, Starting Fri 02/12/2024, Until Thu 05/12/2024, Normal    losartan potassium (COZAAR) 100 mg tablet Take one tablet (100 mg dose) by mouth daily., Starting Sat 02/13/2024, Until Fri 05/13/2024, Normal    pantoprazole sodium (PROTONIX) 20 mg tablet Take one tablet (20 mg dose) by mouth daily., Starting Sat 02/13/2024, Until Fri 05/13/2024, Normal        Primary Survey  Primary Survey  Review of Systems   Review of Systems  Constitutional:  Negative for fatigue and fever.  Musculoskeletal:  Negative for back pain and stiffness.  All other systems reviewed and are negative. Refer to HPI for review of systems. Unless otherwise noted, the  patient's 5-point ROS is negative.  Physical Exam   ED Triage Vitals [03/29/24 1306]  BP (!) 177/99  Heart Rate 60  Resp 18  SpO2 100 %  Temp 98.1 F (36.7 C)    Physical Exam  Nursing note and vitals reviewed. Constitutional: She appears well-developed and well-nourished. She does not appear distressed and does not appear ill.  HENT:  Head: Normocephalic and atraumatic.  Nose: Nose normal.  Eyes: EOM are intact.  Conjunctivae are normal. Pupils are equal, round, and reactive to light.  Pulmonary/Chest: Breath sounds normal.  Abdominal: Soft. There is no abdominal tenderness. Bowel sounds are normal.  Musculoskeletal: Normal range of motion.   Neurological: She is alert and oriented to person, place, and time.  Skin: Skin is warm. Skin is dry.  Psychiatric: She has a normal mood and affect. Her behavior is normal. Judgment and thought content normal.     ED Course   Lab results:   CBC - Abnormal      Result Value   WBC 4.6     RBC 5.43 (*)    HGB 13.9     HCT 43.6     MCV 80.3     MCH 25.6     MCHC 31.9 (*)    Plt Ct 278     RDW SD 39.6     MPV 11.6     NRBC% 0.0     Absolute NRBC Count 0.00    PTT - Normal   PTT 25     Comment: Many commonly administered drugs may affect the results obtained in PT and PTT testing.  PROTIME-INR - Normal   PT 13.7     Comment: Many commonly administered drugs may affect the results obtained in PT and PTT testing.   INR 1.1     Comment: INR Therapeutic Range for DVT, PE:      2.0 - 3.0 INR Mechanical Prosthetic Heart Valves: 2.5 - 3.5  ANTI-XA (UFH) - HEPARIN  INFUSION - Normal   Anti-Xa UFH <0.10     Comment: Therapeutic Range for Anti-Xa UFH is 0.3-0.7 IU/mL Anti-Xa UFH level varies based upon desired intensity of anticoagulation.  Low Protocol: Target Anti-Xa UFH Range: 0.3 - 0.5 IU/mL  High Protocol: Target Anti-Xa UFH Range: 0.4 - 0.7 IU/mL  Many commonly administered drugs may affect the results obtained in Anti-XA testing.   POCT ISTAT VENOUS PANEL   POC CREAT 0.8     SAMPLE SOURCE VENOUS     INSTRUMENT ID [id number]     OPERATOR ID [id number]    LIGHT BLUE TOP  LAVENDER TOP  MINT GREEN-TOP TUBE (PST GEL/LI HEP)  GOLD SST    Imaging:   US  VENOUS LOWER EXTREMITY LEFT   Narrative:    HISTORY:  Swelling  COMPARISON:  02/10/2024  TECHNIQUE: The veins of the left lower extremity were interrogated from the visible common femoral vein to  the distal popliteal vein. The junction of the greater saphenous with the common femoral vein as well as the posterior tibial veins were evaluated. Gray scale, color, spectral, and doppler sonography was utilized and analyzed.  FINDINGS:  There has been decrease in thrombus within the mid to distal superficial femoral and popliteal veins. The common femoral and posterior tibial veins appear patent.    Impression:    IMPRESSION:  Persistent though improved left lower extremity DVT.  Electronically Signed by: Lynwood Portugal on 03/29/2024 4:41 PM  CT ANGIO CHEST PULMONARY   Narrative:  INDICATION: Shortness of breath.  COMPARISON:  None.  TECHNIQUE:  CT ANGIO CHEST PULMONARY was performed during IV administration of contrast. Contrast: 75 mL IOPAMIDOL 76 % IV SOLN   Image post processing was performed creating multi planar 2D and angiographic 3D MIP, shaded surface rendering or 3D volume rendering images for review and comparison. Radiation dose reduction was utilized (automated exposure control, mA or kV adjustment based on patient size, or iterative image reconstruction).  Contrast bolus quality:  satisfactory  FINDINGS: Left lower lobar and segmental pulmonary emboli. Linear web in a right lower lobe segmental pulmonary artery likely representing sequela of chronic pulmonary emboli. RV-LV ratio: 0.8. No evidence of right heart strain.  Heterogeneous multinodular thyroid gland. No pathologically enlarged lymph nodes. Aorta is normal in caliber. Main pulmonary artery is enlarged as can be seen with pulmonary arterial hypertension. Heart size is enlarged. There are coronary artery calcifications.  No suspicious pulmonary nodules. No abnormal pulmonary opacities. Central airways are patent. Small tracheal diverticulum.  There is cholelithiasis. No aggressive osseous lesions. Dextroscoliosis of the thoracic spine.    Impression:    IMPRESSION: Left lower lobar and segmental pulmonary emboli.  Linear web in a right lower lobe segmental pulmonary artery likely representing sequela of chronic pulmonary emboli. RV-LV ratio: 0.8. No evidence of right heart strain.  ###CODE CRITICAL REPORT###  Automated notification pathway concerning the above report was activated at the time below.  ORDERING PROVIDER (Unless stated above may not be the provider contacted): MICHAEL EUGENE CRAIG TIME: 03/29/2024 6:38 PM       Electronically Signed by: Nadara Fret, MD on 03/29/2024 6:39 PM     ECG: ECG Results   None                                                                        Pre-Sedation Procedures    Medical Decision Making Differential diagnoses considered today to include but are not limited to ACS, acute myocardial infarction, pericarditis, pulmonary embolism, pneumonia, pneumothorax, pulmonary effusion, dysrhythmia, atrial fibrillation, anxiety, panic disorder, ETOH, caffeine, nicotine, adverse effects of medication or drugs, anemia or electrolyte derangement.  Nontoxic and healthy appearing 53 year old female in no acute distress with vital signs that were remarkable for elevated blood pressure.  Physical exam as previously indicated.  ED workup included a venous ultrasound of the left lower extremity and a CTPA which revealed segmental PE without noted right heart strain.  Heparin  drip ordered for the patient who, at that time advised that she was not compliant with her Eliquis  at home stating she takes it once a day and sometimes skips days altogether.  At this point, I discussed continuation with the heparin  drip versus discharge home with compliance with regards to her Eliquis .  The patient would like to discharge home.  Plan: Hospitalization considered and discussed at length with the patient.  Discussed with attending physician who advised that discharge home is reasonable considering patient has been noncompliant with her blood  thinner.  The patient and I agreed that she would discharge home and I ordered Eliquis  for her again at a loading dose for a week and scribed as 1 tablet p.o. twice daily thereafter.  Patient's blood pressure was elevated at time of discharge  to 240/110.  I ordered hydralazine  but the patient declined saying she has blood pressure medication at home that she has not been taking.  She agreed to take her blood pressure medicine.  I recommended cardiology follow-up in the next few days.  Return precautions were discussed with the patient understands she should return to the emergency room for worsening symptoms or any urgent or emergent medical concerns.  Amount and/or Complexity of Data Reviewed Labs: ordered. Radiology: ordered.  Risk Prescription drug management.          Provider Communication  Discharge Medication List as of 03/29/2024  8:58 PM     START taking these medications   Details  apixaban  (ELIQUIS ) 5 mg tablet Take 2 tablets by mouth twice a day for 7 days.  Beginning on day 8, take 1 tablet by mouth twice daily., Normal        Discharge Medication List as of 03/29/2024  8:58 PM      Discharge Medication List as of 03/29/2024  8:58 PM      Clinical Impression Final diagnoses:  Pulmonary embolus, left (*)  Chronic deep vein thrombosis (DVT) of left lower extremity, unspecified vein (*)  Uncontrolled hypertension    ED Disposition     ED Disposition  Discharge   Condition  Stable   Comment  --                 Follow-up Information     [facility] - [facility]. Schedule an appointment as soon as possible for a visit in 2 days.   Specialty: Cardiology Contact information: [address] [place]   [id number] [phone]                 Electronically signed by:       [1] Social History Tobacco Use  Smoking Status Never  Smokeless Tobacco Never  [2] Allergies Allergen  Reactions  . Codeine Unknown  . Hydrocodone -Acetaminophen  Hives  . Penicillins Rash   Ozell Sherwood Banks, PA-C 03/30/24 1524

## 2024-04-05 ENCOUNTER — Encounter (HOSPITAL_COMMUNITY): Payer: Self-pay

## 2024-04-05 ENCOUNTER — Ambulatory Visit (HOSPITAL_COMMUNITY)
Admission: EM | Admit: 2024-04-05 | Discharge: 2024-04-05 | Disposition: A | Discharge status: Home / Self Care | Attending: Emergency Medicine | Admitting: Emergency Medicine

## 2024-04-05 DIAGNOSIS — H60391 Other infective otitis externa, right ear: Secondary | ICD-10-CM

## 2024-04-05 DIAGNOSIS — I10 Essential (primary) hypertension: Secondary | ICD-10-CM

## 2024-04-05 DIAGNOSIS — R0981 Nasal congestion: Secondary | ICD-10-CM

## 2024-04-05 MED ORDER — CIPROFLOXACIN-DEXAMETHASONE 0.3-0.1 % OT SUSP: 4.0000 [drp] | Freq: Two times a day (BID) | OTIC | 0 refills | Status: AC

## 2024-04-05 MED ORDER — AZELASTINE HCL 0.1 % NA SOLN: 2.0000 | Freq: Two times a day (BID) | NASAL | 0 refills | Status: AC

## 2024-04-05 NOTE — ED Provider Notes (Signed)
 [facility]    CSN: [id number] Arrival date & time: 04/05/24  1201      History   Chief Complaint Chief Complaint  Patient presents with   Otalgia    HPI Brooke Hughes is a 53 y.o. female.   Patient presents with right ear pain and drainage that began about 2 weeks ago.  Patient states the pain has progressively worsened and she noticed the drainage about a week ago.  Patient denies any fever, body aches, and chills.  Patient states that she has had some mild nasal congestion and runny nose as well.  Of note patient's blood pressure is elevated in clinic today.  Patient does have a history of uncontrolled blood pressure.  Patient is prescribed medications for blood pressure, but states that she has not taken them yet today.  Patient denies any symptoms related to this.  The history is provided by the patient and medical records.  Otalgia   Past Medical History:  Diagnosis Date   Diabetes mellitus without complication (HCC)    Encounter for hepatitis C screening test for low risk patient 05/01/2022   Encounter for screening for HIV 05/01/2022   Hypertension    Obesity    Stroke (HCC) 08/31/2021    Patient Active Problem List   Diagnosis Date Noted   Onychomycosis 12/15/2023   Breast cancer screening by mammogram 12/15/2023   Encounter for screening involving social determinants of health (SDoH) 12/15/2023   Moderate episode of recurrent major depressive disorder (HCC) 05/31/2023   Generalized anxiety disorder 05/31/2023   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   Depressed mood 03/23/2023   Left shoulder pain 11/03/2022   Vaginal pruritus 09/04/2022   Housing situation unstable 09/04/2022   History of cerebrovascular accident (CVA) in adulthood 05/01/2022   Vitamin D deficiency 09/09/2021   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) 09/07/2021   Pure hypercholesterolemia 09/07/2021   Morbid obesity (HCC)  09/02/2021   HTN (hypertension) 08/07/2020   Healthcare maintenance 08/07/2020   Type 2 diabetes mellitus (HCC) 08/02/2020   IDA (iron  deficiency anemia) 08/02/2020   Thyroid nodule 08/02/2020   DVT (deep venous thrombosis) (HCC) 07/29/2020    Past Surgical History:  Procedure Laterality Date   FEMUR FRACTURE SURGERY     r/hip   JOINT REPLACEMENT     PERIPHERAL VASCULAR INTERVENTION Left 08/01/2020   Procedure: PERIPHERAL VASCULAR INTERVENTION;  Surgeon: Gretta Lonni PARAS, MD;  Location: [facility];  Service: Cardiovascular;  Laterality: Left;  common iliac vein   PERIPHERAL VASCULAR THROMBECTOMY N/A 08/01/2020   Procedure: PERIPHERAL VASCULAR THROMBECTOMY - Left Leg;  Surgeon: Gretta Lonni PARAS, MD;  Location: [facility];  Service: Cardiovascular;  Laterality: N/A;    OB History   No obstetric history on file.      Home Medications    Prior to Admission medications   Medication Sig Start Date End Date Taking? Authorizing Provider  azelastine (ASTELIN) 0.1 % nasal spray Place 2 sprays into both nostrils 2 (two) times daily. Use in each nostril as directed 04/05/24  Yes Johnie, Rumaldo A, NP  ciprofloxacin-dexamethasone  (CIPRODEX) OTIC suspension Place 4 drops into the right ear 2 (two) times daily. 04/05/24  Yes Johnie, Emmie Frakes A, NP  Blood Glucose Monitoring Suppl (ONETOUCH VERIO) w/Device KIT 1 each by Does not apply route as needed. 05/23/22   Jolaine Pac, DO  dapagliflozin  propanediol (FARXIGA ) 10 MG TABS tablet Take 1 tablet (10 mg total)  by mouth daily. 12/14/23   Tawkaliyar, Roya, DO  glucose blood (ONETOUCH VERIO) test strip 1 each by Other route 3 (three) times daily. Use as instructed 05/23/22   Jolaine Pac, DO  Lancets 30G MISC 1 each by Does not apply route every 8 (eight) hours. 05/22/22   Jolaine Pac, DO  lisinopril  (ZESTRIL ) 20 MG tablet Take 1 tablet (20 mg total) by mouth daily. 12/01/23   Tobie Gaines, DO  metFORMIN  (GLUCOPHAGE ) 500  MG tablet Take 2 tablets (1,000 mg total) by mouth 2 (two) times daily with a meal. 12/01/23   Tobie Gaines, DO  methocarbamol  (ROBAXIN ) 750 MG tablet Take 1 tablet (750 mg total) by mouth 3 (three) times daily as needed (muscle spasm/pain). 03/22/24   Bernard Drivers, MD    Family History Family History  Problem Relation Age of Onset   Diabetes Mother    Diabetes Father    Colon cancer Neg Hx    Colon polyps Neg Hx    Esophageal cancer Neg Hx    Rectal cancer Neg Hx    Stomach cancer Neg Hx     Social History Social History   Tobacco Use   Smoking status: Never   Smokeless tobacco: Never  Vaping Use   Vaping status: Never Used  Substance Use Topics   Alcohol use: No   Drug use: No     Allergies   Codeine, Vicodin [hydrocodone -acetaminophen ], and Penicillins   Review of Systems Review of Systems  HENT:  Positive for ear pain.    Per HPI  Physical Exam Triage Vital Signs ED Triage Vitals  Encounter Vitals Group     BP 04/05/24 1238 (!) 180/103     Girls Systolic BP Percentile --      Girls Diastolic BP Percentile --      Boys Systolic BP Percentile --      Boys Diastolic BP Percentile --      Pulse Rate 04/05/24 1238 72     Resp 04/05/24 1238 18     Temp 04/05/24 1238 98.2 F (36.8 C)     Temp Source 04/05/24 1238 Oral     SpO2 04/05/24 1238 98 %     Weight --      Height --      Head Circumference --      Peak Flow --      Pain Score 04/05/24 1237 10     Pain Loc --      Pain Education --      Exclude from Growth Chart --    No data found.  Updated Vital Signs BP (!) 180/103 (BP Location: Right Arm)   Pulse 72   Temp 98.2 F (36.8 C) (Oral)   Resp 18   SpO2 98%   Visual Acuity Right Eye Distance:   Left Eye Distance:   Bilateral Distance:    Right Eye Near:   Left Eye Near:    Bilateral Near:     Physical Exam Vitals and nursing note reviewed.  Constitutional:      General: She is awake. She is not in acute distress.    Appearance:  Normal appearance. She is well-developed and well-groomed. She is not ill-appearing.  HENT:     Right Ear: Tympanic membrane normal. Drainage, swelling and tenderness present.     Left Ear: Tympanic membrane, ear canal and external ear normal.     Ears:     Comments: Swelling, tenderness, and purulent drainage noted to right ear  canal    Nose: Congestion present.  Skin:    General: Skin is warm and dry.  Neurological:     Mental Status: She is alert.  Psychiatric:        Behavior: Behavior is cooperative.      UC Treatments / Results  Labs (all labs ordered are listed, but only abnormal results are displayed) Labs Reviewed - No data to display  EKG   Radiology No results found.  Procedures Procedures (including critical care time)  Medications Ordered in UC Medications - No data to display  Initial Impression / Assessment and Plan / UC Course  I have reviewed the triage vital signs and the nursing notes.  Pertinent labs & imaging results that were available during my care of the patient were reviewed by me and considered in my medical decision making (see chart for details).     Patient is overall well-appearing.  Vitals are stable.  Blood pressure is elevated at 180/103.  Exam findings consistent with otitis externa.  Prescribed Ciprodex drops for this.  Prescribed azelastine to help with nasal congestion.  Discussed importance of taking blood pressure medication daily as prescribed.  Discussed follow-up and return precautions. Final Clinical Impressions(s) / UC Diagnoses   Final diagnoses:  Other infective acute otitis externa of right ear  Nasal congestion  Elevated blood pressure reading in office with diagnosis of hypertension     Discharge Instructions      Start using Ciprodex drops 4 drops twice a day for 5 days for external ear infection. You can use azelastine nasal spray twice daily as needed for nasal congestion related to this. Be sure you are  taking your blood pressure medication daily as prescribed. Follow-up with your primary care provider for further management of your blood pressure. Return here for symptoms persist or worsen.   ED Prescriptions     Medication Sig Dispense Auth. Provider   ciprofloxacin-dexamethasone  (CIPRODEX) OTIC suspension Place 4 drops into the right ear 2 (two) times daily. 7.5 mL Johnie Flaming A, NP   azelastine (ASTELIN) 0.1 % nasal spray Place 2 sprays into both nostrils 2 (two) times daily. Use in each nostril as directed 30 mL Johnie Flaming A, NP      PDMP not reviewed this encounter.   Johnie Flaming A, NP 04/05/24 1313

## 2024-04-05 NOTE — ED Triage Notes (Signed)
 Pt c/o rt ear ache with drainage x2wks. Denies taken any meds.

## 2024-04-05 NOTE — Discharge Instructions (Addendum)
 Start using Ciprodex drops 4 drops twice a day for 5 days for external ear infection. You can use azelastine nasal spray twice daily as needed for nasal congestion related to this. Be sure you are taking your blood pressure medication daily as prescribed. Follow-up with your primary care provider for further management of your blood pressure. Return here for symptoms persist or worsen.

## 2024-04-20 ENCOUNTER — Other Ambulatory Visit: Payer: Self-pay

## 2024-04-20 ENCOUNTER — Ambulatory Visit: Admitting: Student

## 2024-04-20 VITALS — BP 162/103 | HR 68 | Temp 97.5°F | Ht 74.0 in | Wt 301.4 lb

## 2024-04-20 DIAGNOSIS — I10 Essential (primary) hypertension: Secondary | ICD-10-CM | POA: Diagnosis present

## 2024-04-20 DIAGNOSIS — E1100 Type 2 diabetes mellitus with hyperosmolarity without nonketotic hyperglycemic-hyperosmolar coma (NKHHC): Secondary | ICD-10-CM

## 2024-04-20 DIAGNOSIS — I2699 Other pulmonary embolism without acute cor pulmonale: Secondary | ICD-10-CM

## 2024-04-20 MED ORDER — APIXABAN 5 MG PO TABS
5.0000 mg | ORAL_TABLET | Freq: Two times a day (BID) | ORAL | 3 refills | Status: AC
  Filled 2024-04-20: qty 60, 30d supply, fill #0

## 2024-04-20 NOTE — Progress Notes (Unsigned)
 CC: ED follow-up  HPI:  Brooke Hughes is a 53 y.o. female with Pmhx of provoked DVT on eliquis , uncontrolled hypertension, who presents for an ED follow-up.  Was recently seen in the ED on 10/12 for bilateral lower extremity edema, found to have right lower lobe segmental PE, and eventually discharged with Eliquis .  Also seen again in the ED on 10/21 for ear pain found to have otitis externa and discharged on Ciprodex.   Please see problem based assessment and plan for additional details.  Past Medical History:  Diagnosis Date   Diabetes mellitus without complication (HCC)    Encounter for hepatitis C screening test for low risk patient 05/01/2022   Encounter for screening for HIV 05/01/2022   Hypertension    Obesity    Stroke (HCC) 08/31/2021    Current Outpatient Medications on File Prior to Visit  Medication Sig Dispense Refill   azelastine (ASTELIN) 0.1 % nasal spray Place 2 sprays into both nostrils 2 (two) times daily. Use in each nostril as directed 30 mL 0   Blood Glucose Monitoring Suppl (ONETOUCH VERIO) w/Device KIT 1 each by Does not apply route as needed. 1 kit 0   ciprofloxacin-dexamethasone  (CIPRODEX) OTIC suspension Place 4 drops into the right ear 2 (two) times daily. 7.5 mL 0   dapagliflozin  propanediol (FARXIGA ) 10 MG TABS tablet Take 1 tablet (10 mg total) by mouth daily. 90 tablet 3   glucose blood (ONETOUCH VERIO) test strip 1 each by Other route 3 (three) times daily. Use as instructed 100 each 12   Lancets 30G MISC 1 each by Does not apply route every 8 (eight) hours. 100 each 11   lisinopril  (ZESTRIL ) 20 MG tablet Take 1 tablet (20 mg total) by mouth daily. 30 tablet 11   metFORMIN  (GLUCOPHAGE ) 500 MG tablet Take 2 tablets (1,000 mg total) by mouth 2 (two) times daily with a meal. 120 tablet 11   methocarbamol  (ROBAXIN ) 750 MG tablet Take 1 tablet (750 mg total) by mouth 3 (three) times daily as needed (muscle spasm/pain). 15 tablet 0   No current  facility-administered medications on file prior to visit.    Review of Systems: ROS negative except for what is noted on the assessment and plan.  Vitals:   04/20/24 1103 04/20/24 1125  BP: (!) 166/101 (!) 162/103  Pulse: 72 68  Temp: (!) 97.5 F (36.4 C)   TempSrc: Oral   SpO2: 99%   Weight: (!) 301 lb 6.4 oz (136.7 kg)   Height: 6' 2 (1.88 m)    Last hemoglobin A1c Lab Results  Component Value Date   HGBA1C 12.0 02/10/2024     Physical Exam: Constitutional: NAD Cardiovascular: RRR, no murmurs. Pulmonary/Chest: Clear bilateral lungs Abdominal: soft, non-tender, non-distended. Extremity: Warm and dry, no calf calf pain/tenderness.  Assessment & Plan:   Patient discussed with Dr. Karna  Assessment & Plan Hypertension, unspecified type BP Readings from Last 3 Encounters:  04/20/24 (!) 162/103  04/05/24 (!) 180/103  03/22/24 (!) 168/88  Uncontrolled hypertension, likely secondary to medication nonadherence. The patient initially endorsed taking her blood pressure medications in the morning; however, upon subsequent phone contact, she denied doing so.  Per chart review, she is supposed to be lisinopril  20 mg and amlodipine  10 mg daily. She has also been intermittently evaluated in the emergency department over the past 2-3 months, where she received prescriptions for hydralazine  50 mg and losartan 100 mg, suggesting inconsistent adherence and possible medication duplication.  I  made multiple attempts to contact the patient for medication reconciliation and verification of her current home regimen, but she was unreachable. Given the uncertainty regarding her actual medication use, blood pressure medication titration was deferred at this time.  Plan: - Will check BMP today,  - Continue lisinopril  20 mg daily and amlodipine  10 mg daily for now. - Recommend home ambulatory blood pressure monitoring with log of readings. - Follow up in 1 week for nurse visit and medication  review, patient advised to take BP meds at least 1-2 hrs before the appointment - Patient advised to bring all current medication bottles to the visit for verification. 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 The patient presented to the ED with increased lower extremity swelling. CT chest revealed segmental pulmonary emboli without evidence of right heart strain. Linear opacity in the right lower lobe likely represents sequelae of chronic pulmonary emboli. Venous duplex ultrasound of the left lower extremity showed decreased thrombus burden within the mid to distal superficial femoral and popliteal veins.  She was initiated on heparin  during hospitalization and discharged on a loading dose of Eliquis  10 mg twice daily for 7 days, now transitioned to Eliquis  5 mg twice daily. She reports adherence to her current regimen and has approximately 10 tablets remaining.  Currently denies chest pain, dyspnea, or new leg pain. Lower extremity edema has markedly improved. Vital signs stable. The episode of pulmonary embolism is likely related to intermittent nonadherence to Eliquis . The importance of strict medication adherence was reinforced. The patient expressed understanding and was advised to notify the clinic if she experiences any issues with medication access or insurance coverage.  Plan: - Continue Eliquis  5 mg twice daily (refill sent to pharmacy). - Reinforced strict adherence to anticoagulation. - Advised to contact clinic if insurance barriers arise -- eligible for sample assistance if needed. - Monitor for symptoms of recurrent DVT/PE (chest pain, shortness of breath, new unilateral leg swelling).  No orders of the defined types were placed in this encounter.   Missy Sandhoff, MD [facility] Internal Medicine, PGY-2  Date  04/21/2024 Time 11:49 AM

## 2024-04-20 NOTE — Assessment & Plan Note (Signed)
 Uncontrolled T2DM, A1c 12 about 3 months ago.    *Ask about home sugar monitoring: She notes that she is taking 29 units of basaglar  nightly and metformin  1000 mg BID.  Intermittently taking the medicine. A1c today 13.1. She is open to adding another medicine SGLT2 to the  current regimen.   Plan: -Increase Basaglar  to 32 units nightly -Continue metformin  1000 mg twice daily -Started Farxiga  5 mg. -Follow-up in 1 month.

## 2024-04-20 NOTE — Assessment & Plan Note (Signed)
 BP elevated, has not been compliant with her medicines.  Losartan 20 mg. - Would add amlodipine .  Plan: -Continue losartan 20 mg.  - BMP today.

## 2024-04-20 NOTE — Patient Instructions (Signed)
 It was a pleasure taking care of you today!    1.  I have sent Eliquis  to your pharmacy.  Please take 5 mg twice a day.  2.  Please remember to bring all your medicines to next appointment, I would like to see you back in 7 days.  3.  Please continue to take your medicines for your blood pressure.(Lisinopril  20 mg, amlodipine  10 mg.)  Every day    I have ordered the following labs for you:  Lab Orders  No laboratory test(s) ordered today      Follow up: 1 week follow-up on blood pressure.   Should you have any questions or concerns please call the internal medicine clinic at [phone].     Missy Sandhoff, MD  [facility]

## 2024-04-20 NOTE — Progress Notes (Unsigned)
 Brooke Hughes

## 2024-04-21 DIAGNOSIS — I2699 Other pulmonary embolism without acute cor pulmonale: Secondary | ICD-10-CM | POA: Insufficient documentation

## 2024-04-21 NOTE — Addendum Note (Signed)
 Addended by: KARNA FELLOWS on: 04/21/2024 01:39 PM   Modules accepted: Level of Service

## 2024-04-21 NOTE — Progress Notes (Signed)
 Internal Medicine Clinic Attending  Case discussed with the resident at the time of the visit.  We reviewed the resident's history and exam and pertinent patient test results.  I agree with the assessment, diagnosis, and plan of care documented in the resident's note.

## 2024-04-21 NOTE — Assessment & Plan Note (Signed)
 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 The patient presented to the ED with increased lower extremity swelling. CT chest revealed segmental pulmonary emboli without evidence of right heart strain. Linear opacity in the right lower lobe likely represents sequelae of chronic pulmonary emboli. Venous duplex ultrasound of the left lower extremity showed decreased thrombus burden within the mid to distal superficial femoral and popliteal veins.  She was initiated on heparin  during hospitalization and discharged on a loading dose of Eliquis  10 mg twice daily for 7 days, now transitioned to Eliquis  5 mg twice daily. She reports adherence to her current regimen and has approximately 10 tablets remaining.  Currently denies chest pain, dyspnea, or new leg pain. Lower extremity edema has markedly improved. Vital signs stable. The episode of pulmonary embolism is likely related to intermittent nonadherence to Eliquis . The importance of strict medication adherence was reinforced. The patient expressed understanding and was advised to notify the clinic if she experiences any issues with medication access or insurance coverage.  Plan: - Continue Eliquis  5 mg twice daily (refill sent to pharmacy). - Reinforced strict adherence to anticoagulation. - Advised to contact clinic if insurance barriers arise -- eligible for sample assistance if needed. - Monitor for symptoms of recurrent DVT/PE (chest pain, shortness of breath, new unilateral leg swelling).

## 2024-04-27 ENCOUNTER — Ambulatory Visit

## 2024-04-27 NOTE — Assessment & Plan Note (Deleted)
 There were no vitals filed for this visit.

## 2024-04-27 NOTE — Progress Notes (Deleted)
 CC: Blood pressure follow-up  HPI:  [name] is a 53 y.o. female with pertinent past medical history of uncontrolled hypertension 2/2 medication nonadherence, May-Thurner syndrome, provoked PE on Eliquis  (history of provoked DVT of LLE secondary to May-Thurner syndrome, previously on Eliquis ) type 2 diabetes (further medical history stated below) and presents today for follow-up. Please see problem based assessment and plan for additional details.  Last clinic appointment: 04/20/2024: Medication management includes lisinopril  20 mg, amlodipine  10 mg.  Have been intermittently evaluated in the emergency department over the past 2 to 3 months where she received prescriptions for hydralazine  50 mg and losartan 100 mg suggesting inconsistent adherence and possible medication duplication.  Dr. Koomson attempted to contact the patient multiple times, however was unable to reach her.  Decided to continue lisinopril  20 mg daily and amlodipine  10 mg daily for now with recommendation for blood pressure log and close follow-up.  Patient was advised to bring all medication bottles.  Last Pertinent Labs Documented:     Latest Ref Rng & Units 03/22/2024    8:06 PM 12/01/2023    4:11 PM 03/23/2023   10:00 AM  BMP  Glucose 70 - 99 mg/dL 689  595  722   BUN 6 - 20 mg/dL 11  8  10    Creatinine 0.44 - 1.00 mg/dL 9.06  9.02  9.21   BUN/Creat Ratio 9 - 23  8  13    Sodium 135 - 145 mmol/L 139  137  139   Potassium 3.5 - 5.1 mmol/L 3.4  3.9  4.4   Chloride 98 - 111 mmol/L 104  100  100   CO2 22 - 32 mmol/L 25  22  23    Calcium  8.9 - 10.3 mg/dL 9.4  9.7  9.9        Latest Ref Rng & Units 03/22/2024    8:06 PM 08/07/2020    3:22 PM 08/02/2020    2:58 AM  CBC  WBC 4.0 - 10.5 K/uL 5.3  5.9  8.1   Hemoglobin 12.0 - 15.0 g/dL 87.2  8.7  8.1   Hematocrit 36.0 - 46.0 % 40.2  28.6  26.5   Platelets 150 - 400 K/uL 292  298  214     Lab Results  Component Value Date   HGBA1C 12.0 02/10/2024   HGBA1C  >14.0 (A) 12/01/2023   HGBA1C 13.1 (A) 03/23/2023     Past Medical History:  Diagnosis Date   Diabetes mellitus without complication (HCC)    Encounter for hepatitis C screening test for low risk patient 05/01/2022   Encounter for screening for HIV 05/01/2022   Hypertension    Obesity    Stroke (HCC) 08/31/2021    Current Outpatient Medications on File Prior to Visit  Medication Sig Dispense Refill   apixaban  (ELIQUIS ) 5 MG TABS tablet Take 1 tablet (5 mg total) by mouth 2 (two) times daily. 60 tablet 3   azelastine (ASTELIN) 0.1 % nasal spray Place 2 sprays into both nostrils 2 (two) times daily. Use in each nostril as directed 30 mL 0   Blood Glucose Monitoring Suppl (ONETOUCH VERIO) w/Device KIT 1 each by Does not apply route as needed. 1 kit 0   ciprofloxacin-dexamethasone  (CIPRODEX) OTIC suspension Place 4 drops into the right ear 2 (two) times daily. 7.5 mL 0   dapagliflozin  propanediol (FARXIGA ) 10 MG TABS tablet Take 1 tablet (10 mg total) by mouth daily. 90 tablet 3   glucose blood (ONETOUCH VERIO)  test strip 1 each by Other route 3 (three) times daily. Use as instructed 100 each 12   Lancets 30G MISC 1 each by Does not apply route every 8 (eight) hours. 100 each 11   lisinopril  (ZESTRIL ) 20 MG tablet Take 1 tablet (20 mg total) by mouth daily. 30 tablet 11   metFORMIN  (GLUCOPHAGE ) 500 MG tablet Take 2 tablets (1,000 mg total) by mouth 2 (two) times daily with a meal. 120 tablet 11   methocarbamol  (ROBAXIN ) 750 MG tablet Take 1 tablet (750 mg total) by mouth 3 (three) times daily as needed (muscle spasm/pain). 15 tablet 0   No current facility-administered medications on file prior to visit.    Family History  Problem Relation Age of Onset   Diabetes Mother    Diabetes Father    Colon cancer Neg Hx    Colon polyps Neg Hx    Esophageal cancer Neg Hx    Rectal cancer Neg Hx    Stomach cancer Neg Hx     Social History   Socioeconomic History   Marital status: Married     Spouse name: Not on file   Number of children: Not on file   Years of education: Not on file   Highest education level: Not on file  Occupational History   Not on file  Tobacco Use   Smoking status: Never   Smokeless tobacco: Never  Vaping Use   Vaping status: Never Used  Substance and Sexual Activity   Alcohol use: No   Drug use: No   Sexual activity: Not on file  Other Topics Concern   Not on file  Social History Narrative   Not on file   Social Drivers of Health   Financial Resource Strain: Low Risk  (09/03/2021)   Received from [facility]   Overall Financial Resource Strain (CARDIA)    Difficulty of Paying Living Expenses: Not hard at all  Food Insecurity: No Food Insecurity (02/10/2024)   Received from [facility]   Hunger Vital Sign    Within the past 12 months, you worried that your food would run out before you got the money to buy more.: Never true    Within the past 12 months, the food you bought just didn't last and you didn't have money to get more.: Never true  Recent Concern: Food Insecurity - Food Insecurity Present (12/29/2023)   Hunger Vital Sign    Worried About Running Out of Food in the Last Year: Often true    Ran Out of Food in the Last Year: Often true  Transportation Needs: Unmet Transportation Needs (02/10/2024)   Received from [facility] - Transportation    In the past 12 months, has lack of transportation kept you from medical appointments or from getting medications?: Yes    In the past 12 months, has lack of transportation kept you from meetings, work, or from getting things needed for daily living?: Yes  Physical Activity: Inactive (07/15/2023)   Exercise Vital Sign    Days of Exercise per Week: 0 days    Minutes of Exercise per Session: 0 min  Stress: No Stress Concern Present (02/10/2024)   Received from [facility] - Occupational Stress Questionnaire    Do you feel stress -  tense, restless, nervous, or anxious, or unable to sleep at night because your mind is troubled all the time - these days?: Only a little  Social Connections: Socially  Isolated (05/22/2022)   Social Connection and Isolation Panel    Frequency of Communication with Friends and Family: Never    Frequency of Social Gatherings with Friends and Family: Never    Attends Religious Services: More than 4 times per year    Active Member of [employer] or Organizations: No    Attends Banker Meetings: Never    Marital Status: Never married  Intimate Partner Violence: Not At Risk (02/09/2024)   Received from [facility]   HITS    Over the last 12 months how often did your partner physically hurt you?: Never    Over the last 12 months how often did your partner insult you or talk down to you?: Never    Over the last 12 months how often did your partner threaten you with physical harm?: Never    Over the last 12 months how often did your partner scream or curse at you?: Never    Review of Systems: ROS   There were no vitals filed for this visit.  Physical Exam: Physical Exam   Assessment & Plan:   Patient {[id number]::discussed with,seen with} Dr. {[id number]::Tpoopjfd,Z. Hoffman,Mullen,Narendra,Vincent,Guilloud,Lau,Machen,Winfrey}  Flu vaccine Pneumococcal vaccine Ophthalmology referral placed 12/14/2023 Ambulatory referral to [facility] care management placed for housing instability  Possibly on metformin  1000 mg twice daily and Farxiga  10 mg daily  Medication dispense history: Amlodipine  10 mg 02/12/2024 90; hydralazine  50 mg 02/12/2024 90; lisinopril  20 mg 12/01/2023 30; losartan 100 mg 02/12/2024 90 Assessment & Plan Hypertension, unspecified type There were no vitals filed for this visit.    No orders of the defined types were placed in this encounter.    Sallyanne Primas, D.O. [facility] Internal Medicine, PGY-1 Date 04/27/2024 Time 8:01 AM

## 2024-04-28 ENCOUNTER — Other Ambulatory Visit: Payer: Self-pay

## 2024-04-28 ENCOUNTER — Telehealth: Payer: Self-pay | Admitting: *Deleted

## 2024-04-28 NOTE — Telephone Encounter (Addendum)
 RTC to patient unable to reach.  Message was left that the Clinics had called to see what medications she is on for blood pressure and the dosing.

## 2024-04-28 NOTE — Telephone Encounter (Signed)
-----   Message from Missy Sandhoff sent at 04/20/2024 12:24 PM EST ----- I called the patient multiple times today after her scheduled appointment in order to verify her home blood pressure medications prior to titrating her regimen. I need to confirm both the names of the antihypertensive medications she is currently taking and the exact doses.  Ms. Kenneth, could you please follow up with the patient this afternoon to review her current blood pressure medications and dosages? Thank you.

## 2024-04-29 ENCOUNTER — Other Ambulatory Visit: Payer: Self-pay

## 2024-05-09 ENCOUNTER — Encounter: Payer: Self-pay | Admitting: Nurse Practitioner

## 2024-08-17 ENCOUNTER — Encounter
